# Patient Record
Sex: Male | Born: 1950 | Race: White | Hispanic: No | Marital: Married | Smoking: Former smoker
Health system: Southern US, Community
[De-identification: ages and names within clinical notes are randomized; demographics above are authoritative.]

## PROBLEM LIST (undated history)

## (undated) DIAGNOSIS — M1990 Unspecified osteoarthritis, unspecified site: Secondary | ICD-10-CM

## (undated) DIAGNOSIS — I2510 Atherosclerotic heart disease of native coronary artery without angina pectoris: Secondary | ICD-10-CM

## (undated) DIAGNOSIS — I499 Cardiac arrhythmia, unspecified: Secondary | ICD-10-CM

## (undated) DIAGNOSIS — I428 Other cardiomyopathies: Secondary | ICD-10-CM

## (undated) DIAGNOSIS — I4891 Unspecified atrial fibrillation: Secondary | ICD-10-CM

## (undated) DIAGNOSIS — Z720 Tobacco use: Secondary | ICD-10-CM

## (undated) DIAGNOSIS — K219 Gastro-esophageal reflux disease without esophagitis: Secondary | ICD-10-CM

## (undated) HISTORY — DX: Gastro-esophageal reflux disease without esophagitis: K21.9

## (undated) HISTORY — DX: Unspecified atrial fibrillation: I48.91

## (undated) HISTORY — DX: Atherosclerotic heart disease of native coronary artery without angina pectoris: I25.10

## (undated) HISTORY — DX: Other cardiomyopathies: I42.8

## (undated) HISTORY — DX: Tobacco use: Z72.0

## (undated) HISTORY — PX: APPENDECTOMY: SHX54

## (undated) HISTORY — PX: TONSILLECTOMY: SUR1361

---

## 2001-03-22 ENCOUNTER — Emergency Department (HOSPITAL_COMMUNITY): Admission: EM | Admit: 2001-03-22 | Discharge: 2001-03-23 | Payer: Self-pay | Admitting: *Deleted

## 2001-03-23 ENCOUNTER — Encounter: Payer: Self-pay | Admitting: *Deleted

## 2003-12-06 HISTORY — PX: COLONOSCOPY: SHX174

## 2004-01-26 ENCOUNTER — Ambulatory Visit (HOSPITAL_COMMUNITY): Admission: RE | Admit: 2004-01-26 | Discharge: 2004-01-26 | Payer: Self-pay | Admitting: General Surgery

## 2004-04-26 ENCOUNTER — Ambulatory Visit (HOSPITAL_COMMUNITY): Admission: RE | Admit: 2004-04-26 | Discharge: 2004-04-26 | Payer: Self-pay | Admitting: Orthopedic Surgery

## 2004-05-12 ENCOUNTER — Encounter (HOSPITAL_COMMUNITY): Admission: RE | Admit: 2004-05-12 | Discharge: 2004-06-11 | Payer: Self-pay | Admitting: Orthopedic Surgery

## 2005-09-08 ENCOUNTER — Ambulatory Visit (HOSPITAL_COMMUNITY): Admission: RE | Admit: 2005-09-08 | Discharge: 2005-09-08 | Payer: Self-pay | Admitting: Family Medicine

## 2007-10-29 ENCOUNTER — Ambulatory Visit (HOSPITAL_COMMUNITY): Admission: RE | Admit: 2007-10-29 | Discharge: 2007-10-29 | Payer: Self-pay | Admitting: Family Medicine

## 2010-07-27 ENCOUNTER — Emergency Department (HOSPITAL_COMMUNITY)
Admission: EM | Admit: 2010-07-27 | Discharge: 2010-07-27 | Payer: Self-pay | Source: Home / Self Care | Admitting: Emergency Medicine

## 2010-08-12 ENCOUNTER — Ambulatory Visit: Payer: Self-pay | Admitting: Cardiology

## 2010-08-12 DIAGNOSIS — I4891 Unspecified atrial fibrillation: Secondary | ICD-10-CM | POA: Insufficient documentation

## 2010-08-12 DIAGNOSIS — K219 Gastro-esophageal reflux disease without esophagitis: Secondary | ICD-10-CM | POA: Insufficient documentation

## 2010-08-13 ENCOUNTER — Encounter: Payer: Self-pay | Admitting: Cardiology

## 2010-08-16 ENCOUNTER — Encounter (INDEPENDENT_AMBULATORY_CARE_PROVIDER_SITE_OTHER): Payer: Self-pay | Admitting: *Deleted

## 2010-08-18 ENCOUNTER — Ambulatory Visit (HOSPITAL_COMMUNITY): Admission: RE | Admit: 2010-08-18 | Discharge: 2010-08-18 | Payer: Self-pay | Admitting: Cardiology

## 2010-08-18 ENCOUNTER — Ambulatory Visit: Payer: Self-pay | Admitting: Cardiovascular Disease

## 2010-08-18 ENCOUNTER — Encounter: Payer: Self-pay | Admitting: Cardiology

## 2010-09-09 ENCOUNTER — Ambulatory Visit: Payer: Self-pay | Admitting: Cardiology

## 2010-09-14 ENCOUNTER — Encounter: Payer: Self-pay | Admitting: Cardiology

## 2011-01-02 LAB — CONVERTED CEMR LAB
BUN: 16 mg/dL
CO2: 26 meq/L
Chloride: 106 meq/L
Cholesterol: 159 mg/dL
Cholesterol: 166 mg/dL (ref 0–200)
Creatinine, Ser: 1 mg/dL
Glucose, Bld: 116 mg/dL
HDL: 40 mg/dL
HDL: 40 mg/dL (ref >39)
LDL (calc): 109 mg/dL
LDL Cholesterol: 105 mg/dL — ABNORMAL HIGH (ref 0–99)
Potassium: 5.3 meq/L
Sodium: 138 meq/L
TSH: 2.084 microintl units/mL (ref 0.350–4.500)
Total CHOL/HDL Ratio: 4.2
Triglyceride fasting, serum: 50 mg/dL
Triglycerides: 104 mg/dL (ref <150)
VLDL: 21 mg/dL (ref 0–40)

## 2011-01-04 NOTE — Assessment & Plan Note (Signed)
Summary: 2 WK NURSE VISIT FOR BP CHECK AND RHYTHM STRIP/TG  Nurse Visit   Vital Signs:  Patient profile:   60 year old male Height:      73 inches Weight:      190 pounds O2 Sat:      97 % on Room air Temp:     97.5 degrees F oral Pulse rate:   83 / minute Pulse (ortho):   94 / minute BP standing:   133 / 88  Vitals Entered By: Teressa Lower RN (September 09, 2010 8:33 AM)  O2 Flow:  Room air  Serial Vital Signs/Assessments:  Time      Position  BP       Pulse  Resp  Temp     By 10:34 AM  Lying LA  131/87   78                    Tammy Sanders RN 10:34 AM  Sitting   128/90   83                    Teressa Lower RN 10:34 AM  Standing  133/88   94                    Tammy Sanders RN  Comments: 10:34 AM pt denies dizziness during ortho vitals By: Teressa Lower RN    Current Medications (verified): 1)  Tums 500 Mg Chew (Calcium Carbonate Antacid) .... Take Prn 2)  Aspir-Low 81 Mg Tbec (Aspirin) .... Take 1 Tab Daily 3)  Diltiazem Hcl 120 Mg Tabs (Diltiazem Hcl) .... Take 1 Tablet By Mouth Once Daily  Allergies (verified): No Known Drug Allergies  Primary Tam Savoia:  Dr.Scott Luking   History of Present Illness: S: 2 week nurse visit B: atrial fib, started on diltiazem 120mg  daily,echo and lipid profile A: denies complaints, rhythm strip , ortho vitals R: Rhythm Strip-atrial fibrillation with heart rate just below 100 bpm.  Controlled heart rate is adequate if not optimal. Increase diltiazem to 180 mg q.d. after current prescription exhausted.   Prescriptions: DILTIAZEM HCL ER BEADS 180 MG XR24H-CAP (DILTIAZEM HCL ER BEADS) Take one capsule by mouth daily  #30 x 6   Entered by:   Teressa Lower RN   Authorized by:   Kathlen Brunswick, MD, [facility]   Signed by:   Teressa Lower RN on 09/13/2010   Method used:   Electronically to        [facility] (retail)       [address]       [place], [place]  [address]       Ph: [phone]       Fax: [phone]   RxID:   [id number]  I spoke with pt's spouse, verbalized understanding   Teressa Lower RN  September 13, 2010 4:48 PM

## 2011-01-04 NOTE — Letter (Signed)
Summary: [place] [employer], agricultural at [facility]  [address] [place] [address]   Phone: [phone]  Fax: [phone]      August 16, 2010 MRN: [id number]   David Dougherty [address] [place], [place]  [address]   Dear Mr. Brooking,  Your test ordered by Selena Batten has been reviewed by your physician (or physician assistant) and was found to be normal or stable. Your physician (or physician assistant) felt no changes were needed at this time.  ____ Echocardiogram  ____ Cardiac Stress Test  _x___ Lab Work  ____ Peripheral vascular study of arms, legs or neck  ____ CT scan or X-ray  ____ Lung or Breathing test  ____ Other:  No change in medical treatment at this time, per Dr. Dietrich Pates.  Enclosed is a copy of your labwork for your records.  Thank you, Tammy Allyne Gee RN    [place] Bing, MD, [facility] Arena.C.Gaylord Shih, MD, F.A.C.C Lewayne Bunting, MD, F.A.C.C Nona Dell, MD, F.A.C.C Charlton Haws, MD, Lenise Arena.C.C

## 2011-01-04 NOTE — Assessment & Plan Note (Signed)
Summary: Initial OV /rm   Visit Type:  Initial Consult Primary Provider:  Dr.Scott Luking   History of Present Illness: [name] is seen in the office today for an initial visit at the kind request of Dr. Lilyan Punt for evaluation of a newly diagnosed atrial fibrillation.  David Dougherty has been incredibly healthy all of his life.  He has no chronic medical conditions and takes no medications routinely.  He has not been hospitalized for approximately the past 50 years after undergoing tonsillectomy and appendectomy as a child.  He has never been told of heart problems nor been evaluated by cardiologist.  He has never undergone any significant medical testing.  At a recent routine physical, EKG showed atrial fibrillation with a somewhat rapid response.  David Dougherty recalls having had an EKG some years ago, perhaps at [facility].  There is no history of thyroid disease or hypertension.      EKG  Procedure date:  08/12/2010  Findings:      Rhythm Strip  Atrial fibrillation with a heart rate of 104 BPM at rest.  After 6 minutes of walking, patient traversed to 1500 feet without difficulty.  Heart rate increased only to 115 bpm.  -  Date:  06/08/2010    Cholesterol: 159    LDL-calculated: 109    HDL: 40    Triglycerides: 50    BG Random: 116    BUN: 16    Creatinine: 1    Sodium: 138    Potassium: 5.3    Chloride: 106    CO2 Total: 26   Current Medications (verified): 1)  Tums 500 Mg Chew (Calcium Carbonate Antacid) .... Take Prn 2)  Aspir-Low 81 Mg Tbec (Aspirin) .... Take 1 Tab Daily 3)  Diltiazem Hcl 120 Mg Tabs (Diltiazem Hcl) .... Take 1 Tablet By Mouth Once Daily  Allergies (verified): No Known Drug Allergies  Past History:  Family History: Last updated: 08/12/2010 Father has a history of hypertension, diabetes and myocardial infarction and is deceased. Mother-alive and well Siblings-2 brothers died in infancy; one sister is alive and  well.  Social History: Last updated: 08/12/2010 Employment: Engineer, site Married; 5 children Tobacco Use - Yes. dips Alcohol Use - no Regular Exercise - no Drug Use - no  Past Medical History: Gastroesophageal reflux disease-mild; managed with antacids Tobacco abuse-snuff  Past Surgical History: Appendectomy Tonsillectomy Colonoscopy-negative in 2005  Family History: Father has a history of hypertension, diabetes and myocardial infarction and is deceased. Mother-alive and well Siblings-2 brothers died in infancy; one sister is alive and well.  Social History: Employment: Engineer, site Married; 5 children Tobacco Use - Yes. dips Alcohol Use - no Regular Exercise - no Drug Use - no  Review of Systems       requires corrective lenses; upper dentures; arthritic discomfort of the hands; intermittent mild pedal edema.  All other systems reviewed and are negative.  Vital Signs:  Patient profile:   60 year old male Height:      73 inches Weight:      190 pounds BMI:     25.16 Pulse rate:   55 / minute BP sitting:   133 / 90  (right arm)  Vitals Entered By: Dreama Saa, CNA (August 12, 2010 1:16 PM)  Physical Exam  General:  Proportionate weight and height; well-developed; no acute distress: HEENT-Justice/AT; PERRL; EOM intact; conjunctiva and lids nl:  Neck-No JVD; no carotid bruits: Endocrine-No thyromegaly: Lungs-No tachypnea,  clear without rales, rhonchi or wheezes: CV-normal PMI; normal S1 and S2:;  Abdomen-BS normal; soft and non-tender without masses or organomegaly: MS-No deformities, cyanosis or clubbing: Neurologic-Nl cranial nerves; symmetric strength and tone: Skin- Warm, no sig. lesions: Extremities-Nl distal pulses; no edema    Impression & Recommendations:  Problem # 1:  ATRIAL FIBRILLATION (ICD-427.31) Arrhythmia is of uncertain duration.  We will attempt to locate a previous EKG, but that will not allow [place]  to accurately date the onset of AF.  Patient is asymptomatic despite very mild tachycardia.  He could reasonably be treated with no rate control medication, but we will add diltiazem 120 mg q.d.  He will return in 2 weeks for rhythm strip and reassessment of vital signs by the cardiology nurses.  In the interim, an echocardiogram and TSH level will be obtained.  I suspect that he has no structural heart disease and thus has a very low risk of thromboembolism.  It does not appear that he will need warfarin.  He may continue daily aspirin.  The likelihood of achieving and maintaining sinus rhythm is low, especially in this gentleman who is does not like to take medication.  A strategy of rate control will be pursued.  I will plan to reassess this nice gentleman in 6 months.  Other Orders: T-TSH [id number]) T-Lipid Profile [id number]) 2-D Echocardiogram (2D Echo)  Patient Instructions: 1)  Your physician recommends that you schedule a follow-up appointment in: 2 weeks to check vital signs and rhythm strip and in 6 months  2)  Your physician recommends that you return for lab work in: Today 3)  Your physician has recommended you make the following change in your medication: Start taking Diltiazem 120mg  by mouth once daily  4)  Your physician has requested that you have an echocardiogram.  Echocardiography is a painless test that uses sound waves to create images of your heart. It provides your doctor with information about the size and shape of your heart and how well your heart's chambers and valves are working.  This procedure takes approximately one hour. There are no restrictions for this procedure. Prescriptions: DILTIAZEM HCL 120 MG TABS (DILTIAZEM HCL) take 1 tablet by mouth once daily  #30 x 6   Entered by:   Larita Fife Via LPN   Authorized by:   Kathlen Brunswick, MD, [facility]   Signed by:   Larita Fife Via LPN on 73/41/9379   Method used:   Electronically to        [facility] (retail)        [address]       [place], [place]  [address]       Ph: [phone]       Fax: [phone]   RxID:   [id number]   Prevention & Chronic Care Immunizations   Influenza vaccine: Not documented    Tetanus booster: Not documented    Pneumococcal vaccine: Not documented  Colorectal Screening   Hemoccult: Not documented    Colonoscopy: Not documented  Other Screening   PSA: Not documented   Smoking status: current  (08/10/2010)    Screening comments: patient dips  Lipids   Total Cholesterol: 159  (06/08/2010)   LDL: Not documented   LDL Direct: Not documented   HDL: 40  (06/08/2010)   Triglycerides: Not documented

## 2011-11-25 ENCOUNTER — Encounter: Payer: Self-pay | Admitting: Cardiology

## 2013-03-22 ENCOUNTER — Ambulatory Visit: Payer: Self-pay | Admitting: Family Medicine

## 2013-03-27 ENCOUNTER — Encounter: Payer: Self-pay | Admitting: Family Medicine

## 2013-03-27 ENCOUNTER — Ambulatory Visit (INDEPENDENT_AMBULATORY_CARE_PROVIDER_SITE_OTHER): Payer: Self-pay | Admitting: Family Medicine

## 2013-03-27 VITALS — BP 126/84 | Temp 98.4°F | Ht 71.0 in | Wt 203.5 lb

## 2013-03-27 DIAGNOSIS — J309 Allergic rhinitis, unspecified: Secondary | ICD-10-CM

## 2013-03-27 MED ORDER — AMOXICILLIN 500 MG PO TABS: 500.0000 mg | ORAL_TABLET | Freq: Two times a day (BID) | ORAL | Status: DC

## 2013-03-27 MED ORDER — METHYLPREDNISOLONE ACETATE 40 MG/ML IJ SUSP
40.0000 mg | Freq: Once | INTRAMUSCULAR | Status: AC
  Administered 2013-03-27: 40 mg via INTRAMUSCULAR

## 2013-03-27 NOTE — Patient Instructions (Signed)
Loratadine 10 mg one daily  Physical and labs

## 2013-03-27 NOTE — Progress Notes (Signed)
  Subjective:    Patient ID: David Dougherty, male    DOB: June 18, 1951, 62 y.o.   MRN: [id number]  HPIpatient with head congestion drainage coughing some chest congestion denies wheezing difficulty breathing nausea vomiting diarrhea relates some sinus pressure some ear pressure do no sweats or chills vomiting or diarrhea. Did bring up some mucoid drainage but states now it's doing better.    Review of Systemssinus nontender eardrums normal neck supple lungs clear     Objective:   Physical Exam  Lungs are clear hearts regular neck supple eardrums normal nontender sinus      Assessment & Plan:  Probable URI with allergic rhinitis-Depo-Medrol shot, loratadine daily. I also recommend amoxicillin 500 mg 3 times a day 10 days. Followup if ongoing troubles. Regular physical recommended.

## 2014-07-30 ENCOUNTER — Emergency Department (HOSPITAL_COMMUNITY)
Admission: EM | Admit: 2014-07-30 | Discharge: 2014-07-30 | Disposition: A | Discharge status: Home / Self Care | Payer: BC Managed Care – PPO | Attending: Emergency Medicine | Admitting: Emergency Medicine

## 2014-07-30 ENCOUNTER — Encounter (HOSPITAL_COMMUNITY): Payer: Self-pay | Admitting: Emergency Medicine

## 2014-07-30 DIAGNOSIS — Y9389 Activity, other specified: Secondary | ICD-10-CM | POA: Insufficient documentation

## 2014-07-30 DIAGNOSIS — Y929 Unspecified place or not applicable: Secondary | ICD-10-CM | POA: Insufficient documentation

## 2014-07-30 DIAGNOSIS — S91009A Unspecified open wound, unspecified ankle, initial encounter: Principal | ICD-10-CM

## 2014-07-30 DIAGNOSIS — Z8719 Personal history of other diseases of the digestive system: Secondary | ICD-10-CM | POA: Diagnosis not present

## 2014-07-30 DIAGNOSIS — S81809A Unspecified open wound, unspecified lower leg, initial encounter: Principal | ICD-10-CM

## 2014-07-30 DIAGNOSIS — S81009A Unspecified open wound, unspecified knee, initial encounter: Secondary | ICD-10-CM | POA: Diagnosis present

## 2014-07-30 DIAGNOSIS — Z87891 Personal history of nicotine dependence: Secondary | ICD-10-CM | POA: Diagnosis not present

## 2014-07-30 DIAGNOSIS — Z8679 Personal history of other diseases of the circulatory system: Secondary | ICD-10-CM | POA: Diagnosis not present

## 2014-07-30 DIAGNOSIS — W28XXXA Contact with powered lawn mower, initial encounter: Secondary | ICD-10-CM | POA: Diagnosis not present

## 2014-07-30 DIAGNOSIS — S81811A Laceration without foreign body, right lower leg, initial encounter: Secondary | ICD-10-CM

## 2014-07-30 MED ORDER — LIDOCAINE HCL (PF) 1 % IJ SOLN
5.0000 mL | Freq: Once | INTRAMUSCULAR | Status: AC
  Administered 2014-07-30: 5 mL via INTRADERMAL
  Filled 2014-07-30: qty 5

## 2014-07-30 MED ORDER — POVIDONE-IODINE 10 % EX SOLN
CUTANEOUS | Status: AC
  Filled 2014-07-30: qty 118

## 2014-07-30 MED ORDER — BACITRACIN 500 UNIT/GM EX OINT
1.0000 "application " | TOPICAL_OINTMENT | Freq: Two times a day (BID) | CUTANEOUS | Status: DC
  Administered 2014-07-30: 1 via TOPICAL
  Filled 2014-07-30 (×3): qty 0.9

## 2014-07-30 MED ORDER — BACITRACIN ZINC 500 UNIT/GM EX OINT
TOPICAL_OINTMENT | CUTANEOUS | Status: AC
  Filled 2014-07-30: qty 0.9

## 2014-07-30 NOTE — ED Notes (Signed)
PT has laceration to left lower leg. PT was unloading a lawn mower and cut his leg on the tongue of the trailer.

## 2014-07-30 NOTE — ED Provider Notes (Signed)
CSN: [id number]     Arrival date & time 07/30/14  1806 History   First MD Initiated Contact with Patient 07/30/14 1940     Chief Complaint  Patient presents with  . Extremity Laceration   David Dougherty is a 63 y.o. male who presents to the Emergency Department complaining of laceration to the right lower leg that occurred due to a direct blow to a metal edge of a trailer.  Pt denies numbness or weakness of the extremity.    (Consider location/radiation/quality/duration/timing/severity/associated sxs/prior Treatment) Patient is a 63 y.o. male presenting with skin laceration.  Laceration Location:  Leg Leg laceration location:  R lower leg Depth:  Through dermis Quality: straight   Bleeding: controlled   Time since incident: just prior to ED arrival. Laceration mechanism:  Metal edge Pain details:    Quality:  Aching   Severity:  Mild   Timing:  Constant   Progression:  Unchanged Foreign body present:  No foreign bodies Relieved by:  Nothing Worsened by:  Nothing tried Ineffective treatments:  None tried Tetanus status:  Up to date   Past Medical History  Diagnosis Date  . GERD (gastroesophageal reflux disease)   . Tobacco abuse     Snuff  . Atrial fibrillation    Past Surgical History  Procedure Laterality Date  . Appendectomy    . Tonsillectomy    . Colonoscopy  2005    Neg   Family History  Problem Relation Age of Onset  . Heart attack Father   . Hypertension Father   . Diabetes Father    History  Substance Use Topics  . Smoking status: Former Research scientist (life sciences)  . Smokeless tobacco: Current User    Types: Snuff  . Alcohol Use: No    Review of Systems  Constitutional: Negative for fever and chills.  Musculoskeletal: Negative for arthralgias, back pain and joint swelling.  Skin: Positive for wound.       Laceration   Neurological: Negative for dizziness, weakness and numbness.  Hematological: Does not bruise/bleed easily.  All other systems reviewed and are  negative.     Allergies  Review of patient's allergies indicates no known allergies.  Home Medications   Prior to Admission medications   Not on File   BP 152/95  Pulse 98  Temp(Src) 98.3 F (36.8 C) (Oral)  Resp 20  Ht 6\' 1"  (1.854 m)  Wt 185 lb (83.915 kg)  BMI 24.41 kg/m2  SpO2 99% Physical Exam  Nursing note and vitals reviewed. Constitutional: He is oriented to person, place, and time. He appears well-developed and well-nourished. No distress.  HENT:  Head: Normocephalic and atraumatic.  Cardiovascular: Normal rate, regular rhythm, normal heart sounds and intact distal pulses.   No murmur heard. Pulmonary/Chest: Effort normal and breath sounds normal. No respiratory distress.  Musculoskeletal: Normal range of motion. He exhibits no edema and no tenderness.  Pt has full ROM of the right knee, foot and ankle.  Distal sensation intact.    Neurological: He is alert and oriented to person, place, and time. He exhibits normal muscle tone. Coordination normal.  Skin: Skin is warm. Laceration noted.  Laceration to the anterior right lower leg.  Bleeding controlled.  No FB's or hematoma.    ED Course  Procedures (including critical care time) Labs Review Labs Reviewed - No data to display  Imaging Review No results found.   EKG Interpretation None      MDM   Final diagnoses:  Laceration of  lower leg without complication, right, initial encounter    LACERATION REPAIR Performed by: Claris Guymon L. Authorized by: Hale Bogus Consent: Verbal consent obtained. Risks and benefits: risks, benefits and alternatives were discussed Consent given by: patient Patient identity confirmed: provided demographic data Prepped and Draped in normal sterile fashion Wound explored  Laceration Location: right lower leg Laceration Length: 5 cm  No Foreign Bodies seen or palpated  Anesthesia: local infiltration  Local anesthetic: lidocaine 1% w/o  epinephrine  Anesthetic total: 3 ml  Irrigation method: syringe Amount of cleaning: standard  Skin closure: staples  Number of staples: 7  Technique: stapling  Patient tolerance: Patient tolerated the procedure well with no immediate complications.   Pt ambulates with steady gait.  No injury to the deep structures on exam.  He agrees to proper wound care, staples out in 7-10 days and to return here for any signs of infection. OTC  Tylenol or ibuprofen if needed for pain.    David Cattell L. Nikai Quest, PA-C 08/01/14 1316

## 2014-07-30 NOTE — Discharge Instructions (Signed)
Laceration Care, Adult °A laceration is a cut that goes through all layers of the skin. The cut goes into the tissue beneath the skin. °HOME CARE °For stitches (sutures) or staples: °· Keep the cut clean and dry. °· If you have a bandage (dressing), change it at least once a day. Change the bandage if it gets wet or dirty, or as told by your doctor. °· Wash the cut with soap and water 2 times a day. Rinse the cut with water. Pat it dry with a clean towel. °· Put a thin layer of medicated cream on the cut as told by your doctor. °· You may shower after the first 24 hours. Do not soak the cut in water until the stitches are removed. °· Only take medicines as told by your doctor. °· Have your stitches or staples removed as told by your doctor. °For skin adhesive strips: °· Keep the cut clean and dry. °· Do not get the strips wet. You may take a bath, but be careful to keep the cut dry. °· If the cut gets wet, pat it dry with a clean towel. °· The strips will fall off on their own. Do not remove the strips that are still stuck to the cut. °For wound glue: °· You may shower or take baths. Do not soak or scrub the cut. Do not swim. Avoid heavy sweating until the glue falls off on its own. After a shower or bath, pat the cut dry with a clean towel. °· Do not put medicine on your cut until the glue falls off. °· If you have a bandage, do not put tape over the glue. °· Avoid lots of sunlight or tanning lamps until the glue falls off. Put sunscreen on the cut for the first year to reduce your scar. °· The glue will fall off on its own. Do not pick at the glue. °You may need a tetanus shot if: °· You cannot remember when you had your last tetanus shot. °· You have never had a tetanus shot. °If you need a tetanus shot and you choose not to have one, you may get tetanus. Sickness from tetanus can be serious. °GET HELP RIGHT AWAY IF:  °· Your pain does not get better with medicine. °· Your arm, hand, leg, or foot loses feeling  (numbness) or changes color. °· Your cut is bleeding. °· Your joint feels weak, or you cannot use your joint. °· You have painful lumps on your body. °· Your cut is red, puffy (swollen), or painful. °· You have a red line on the skin near the cut. °· You have yellowish-white fluid (pus) coming from the cut. °· You have a fever. °· You have a bad smell coming from the cut or bandage. °· Your cut breaks open before or after stitches are removed. °· You notice something coming out of the cut, such as wood or glass. °· You cannot move a finger or toe. °MAKE SURE YOU:  °· Understand these instructions. °· Will watch your condition. °· Will get help right away if you are not doing well or get worse. °Document Released: 05/09/2008 Document Revised: 02/13/2012 Document Reviewed: 05/17/2011 °ExitCare® Patient Information ©2015 [employer]. This information is not intended to replace advice given to you by your health care provider. Make sure you discuss any questions you have with your health care provider. ° °Stitches, Staples, or Skin Adhesive Strips  °Stitches (sutures), staples, and skin adhesive strips hold the skin together   as it heals. They will usually be in place for 7 days or less. °HOME CARE °· Wash your hands with soap and water before and after you touch your wound. °· Only take medicine as told by your doctor. °· Cover your wound only if your doctor told you to. Otherwise, leave it open to air. °· Do not get your stitches wet or dirty. If they get dirty, dab them gently with a clean washcloth. Wet the washcloth with soapy water. Do not rub. Pat them dry gently. °· Do not put medicine or medicated cream on your stitches unless your doctor told you to. °· Do not take out your own stitches or staples. Skin adhesive strips will fall off by themselves. °· Do not pick at the wound. Picking can cause an infection. °· Do not miss your follow-up appointment. °· If you have problems or questions, call your doctor. °GET  HELP RIGHT AWAY IF:  °· You have a temperature by mouth above 102° F (38.9° C), not controlled by medicine. °· You have chills. °· You have redness or pain around your stitches. °· There is puffiness (swelling) around your stitches. °· You notice fluid (drainage) from your stitches. °· There is a bad smell coming from your wound. °MAKE SURE YOU: °· Understand these instructions. °· Will watch your condition. °· Will get help if you are not doing well or get worse. °Document Released: 09/18/2009 Document Revised: 02/13/2012 Document Reviewed: 09/18/2009 °ExitCare® Patient Information ©2015 [employer]. This information is not intended to replace advice given to you by your health care provider. Make sure you discuss any questions you have with your health care provider. ° °

## 2014-08-07 NOTE — ED Provider Notes (Addendum)
Medical screening examination/treatment/procedure(s) were performed by non-physician practitioner and as supervising physician I was immediately available for consultation/collaboration.   EKG Interpretation None         EKG Interpretation None        Tanna Furry, MD 08/07/14 2113  Tanna Furry, MD 09/11/14 [phone]

## 2014-08-08 ENCOUNTER — Encounter: Payer: Self-pay | Admitting: Nurse Practitioner

## 2014-08-08 ENCOUNTER — Ambulatory Visit: Payer: Self-pay | Admitting: Family Medicine

## 2014-08-08 ENCOUNTER — Ambulatory Visit (INDEPENDENT_AMBULATORY_CARE_PROVIDER_SITE_OTHER): Payer: BC Managed Care – PPO | Admitting: Nurse Practitioner

## 2014-08-08 VITALS — BP 128/78 | Ht 71.0 in | Wt 198.0 lb

## 2014-08-08 DIAGNOSIS — Z4802 Encounter for removal of sutures: Secondary | ICD-10-CM

## 2014-08-10 ENCOUNTER — Encounter: Payer: Self-pay | Admitting: Nurse Practitioner

## 2014-08-10 NOTE — Progress Notes (Signed)
Subjective:  Presents with his wife for staple removal of a laceration on his right anterior knee. Has had staples for about 10 days. No fever or drainage.  Objective:   BP 128/78  Ht 5\' 11"  (1.803 m)  Wt 198 lb (89.812 kg)  BMI 27.63 kg/m2 Wound is healing well with good adhesion of the edges. No evidence of infection. At first to the staples are difficult to remove due to faulty equipment. Remainder of staples were removed without difficulty, minimal bleeding.  Assessment: Removal of wound staples  Plan: Reviewed proper skin care and warning signs of infection, call back if any problems.

## 2014-08-15 ENCOUNTER — Ambulatory Visit: Payer: Self-pay | Admitting: Family Medicine

## 2014-11-13 ENCOUNTER — Encounter: Payer: Self-pay | Admitting: Family Medicine

## 2014-11-13 ENCOUNTER — Ambulatory Visit (INDEPENDENT_AMBULATORY_CARE_PROVIDER_SITE_OTHER): Payer: BC Managed Care – PPO | Admitting: Family Medicine

## 2014-11-13 ENCOUNTER — Telehealth: Payer: Self-pay | Admitting: Family Medicine

## 2014-11-13 ENCOUNTER — Telehealth: Payer: Self-pay

## 2014-11-13 VITALS — BP 124/86 | Ht 71.0 in | Wt 200.0 lb

## 2014-11-13 DIAGNOSIS — R5383 Other fatigue: Secondary | ICD-10-CM

## 2014-11-13 DIAGNOSIS — I481 Persistent atrial fibrillation: Secondary | ICD-10-CM

## 2014-11-13 DIAGNOSIS — I4819 Other persistent atrial fibrillation: Secondary | ICD-10-CM

## 2014-11-13 DIAGNOSIS — Z0000 Encounter for general adult medical examination without abnormal findings: Secondary | ICD-10-CM

## 2014-11-13 DIAGNOSIS — Z125 Encounter for screening for malignant neoplasm of prostate: Secondary | ICD-10-CM

## 2014-11-13 DIAGNOSIS — I499 Cardiac arrhythmia, unspecified: Secondary | ICD-10-CM

## 2014-11-13 DIAGNOSIS — K219 Gastro-esophageal reflux disease without esophagitis: Secondary | ICD-10-CM

## 2014-11-13 LAB — BASIC METABOLIC PANEL
BUN: 11 mg/dL (ref 6–23)
CO2: 27 mEq/L (ref 19–32)
Calcium: 9.6 mg/dL (ref 8.4–10.5)
Chloride: 102 mEq/L (ref 96–112)
Creat: 0.96 mg/dL (ref 0.50–1.35)
Glucose, Bld: 102 mg/dL — ABNORMAL HIGH (ref 70–99)
Potassium: 4.4 mEq/L (ref 3.5–5.3)
Sodium: 139 mEq/L (ref 135–145)

## 2014-11-13 LAB — LIPID PANEL
Cholesterol: 179 mg/dL (ref 0–200)
HDL: 41 mg/dL (ref >39)
LDL Cholesterol: 124 mg/dL — ABNORMAL HIGH (ref 0–99)
Total CHOL/HDL Ratio: 4.4 Ratio
Triglycerides: 72 mg/dL (ref <150)
VLDL: 14 mg/dL (ref 0–40)

## 2014-11-13 LAB — HEPATIC FUNCTION PANEL
ALT: 18 U/L (ref 0–53)
AST: 18 U/L (ref 0–37)
Albumin: 4.3 g/dL (ref 3.5–5.2)
Alkaline Phosphatase: 67 U/L (ref 39–117)
Bilirubin, Direct: 0.2 mg/dL (ref 0.0–0.3)
Indirect Bilirubin: 0.6 mg/dL (ref 0.2–1.2)
Total Bilirubin: 0.8 mg/dL (ref 0.2–1.2)
Total Protein: 7.1 g/dL (ref 6.0–8.3)

## 2014-11-13 LAB — TSH: TSH: 2.594 u[IU]/mL (ref 0.350–4.500)

## 2014-11-13 NOTE — Progress Notes (Addendum)
Subjective:    Patient ID: David Dougherty, male    DOB: 1951/03/15, 63 y.o.   MRN: [id number]  HPI The patient comes in today for a wellness visit.    A review of their health history was completed.  A review of medications was also completed.  Any needed refills; N/A  Eating habits: good  Falls/  MVA accidents in past few months: none  Regular exercise: yes  Specialist pt sees on regular basis: none  Preventative health issues were discussed.   Additional concerns: none  Patient has a history of atrial fibrillation. He relates that he had a problem with this several years ago he doesn't think he is having a problem now he doesn't denies having heart issues he denies chest tightness pressure pain or shortness of breath or swelling in the legs  He does relate intermittent reflux issues I instructed that he has frequent enough to where he should consider taken omeprazole when he sees gastroenterology name might recommend EGD. Review of Systems  Constitutional: Negative for fever, activity change and appetite change.  HENT: Negative for congestion and rhinorrhea.   Eyes: Negative for discharge.  Respiratory: Negative for cough and wheezing.   Cardiovascular: Negative for chest pain.  Gastrointestinal: Negative for vomiting, abdominal pain and blood in stool.  Endocrine: Positive for polyuria.  Genitourinary: Negative for frequency and difficulty urinating.  Musculoskeletal: Negative for neck pain.  Skin: Negative for rash.  Allergic/Immunologic: Negative for environmental allergies and food allergies.  Neurological: Negative for weakness and headaches.  Psychiatric/Behavioral: Negative for agitation.       Objective:   Physical Exam  Constitutional: He appears well-developed and well-nourished.  HENT:  Head: Normocephalic and atraumatic.  Right Ear: External ear normal.  Left Ear: External ear normal.  Nose: Nose normal.  Mouth/Throat: Oropharynx is clear and  moist.  Eyes: EOM are normal. Pupils are equal, round, and reactive to light.  Neck: Normal range of motion. Neck supple. No thyromegaly present.  Cardiovascular: Normal rate and normal heart sounds.   No murmur heard. Irregular, rate controlled  Pulmonary/Chest: Effort normal and breath sounds normal. No respiratory distress. He has no wheezes.  Abdominal: Soft. Bowel sounds are normal. He exhibits no distension and no mass. There is no tenderness.  Genitourinary: Penis normal.  Musculoskeletal: Normal range of motion. He exhibits no edema.  Lymphadenopathy:    He has no cervical adenopathy.  Neurological: He is alert. He exhibits normal muscle tone.  Skin: Skin is warm and dry. No erythema.  Psychiatric: He has a normal mood and affect. His behavior is normal. Judgment normal.    EKG- a fib rate controlled      Assessment & Plan:  Has reflux 2 times a week-prilosec qod see GI Needs colonoscopy, his last one was over 10 years ago-family requests Dr. Oneida Alar Do labs as recommended A.fib- start ASA 81 mg, check ECHO, cardio referral (most likely permanent) [place] score very low doubt other anticoagulant  Wellness/safety measures/dietary measures all discussed. Patient defers on flu shot. He will get pneumonia vaccine when he turns 57. He also relates he will call Dr. Oneida Alar office to set up colonoscopy. We will send a copy of this to Dr. Oneida Alar.  Addendum-echo was ordered because patient has atrial fibrillation and has dyspnea/shortness of breath with activity upon review his last echo was 2011 and it showed a depressed ejection fraction of 35-40%. We need to know if his clinical condition with his heart is getting worse.  He may need to have a implanted defibrillator. This echo is certainly medically indicated. Insurance company initially rejected the request for the echo.

## 2014-11-13 NOTE — Telephone Encounter (Signed)
Patient's wife Adela Lank) called to say that Dr Wolfgang Phoenix told patient to call us to set up his colonoscopy. He is wanting this done before the end of the month. I told wife I didn't know if that was possible, but I would have the triage nurse call them back. [phone]

## 2014-11-13 NOTE — Telephone Encounter (Signed)
Pt's [employer] states "criteria not met" for approval of the ECHO, please see information in red folder, please advise (I can edit request before formally submitting it)

## 2014-11-14 LAB — PSA: PSA: 4.6 ng/mL — ABNORMAL HIGH (ref <=4.00)

## 2014-11-14 NOTE — Progress Notes (Signed)
Results discussed with wife. Transferred up front to schedule f/u with Dr. Nicki Reaper next week.

## 2014-11-17 NOTE — Telephone Encounter (Signed)
This was completed with further information in the addendum thank you, ready for re-submittal

## 2014-11-20 ENCOUNTER — Ambulatory Visit (HOSPITAL_COMMUNITY)
Admission: RE | Admit: 2014-11-20 | Discharge: 2014-11-20 | Disposition: A | Discharge status: Home / Self Care | Payer: BC Managed Care – PPO | Source: Ambulatory Visit | Attending: Family Medicine | Admitting: Family Medicine

## 2014-11-20 ENCOUNTER — Ambulatory Visit (INDEPENDENT_AMBULATORY_CARE_PROVIDER_SITE_OTHER): Payer: BC Managed Care – PPO | Admitting: Family Medicine

## 2014-11-20 ENCOUNTER — Encounter: Payer: Self-pay | Admitting: Family Medicine

## 2014-11-20 VITALS — BP 140/90 | Ht 71.0 in | Wt 202.0 lb

## 2014-11-20 DIAGNOSIS — I340 Nonrheumatic mitral (valve) insufficiency: Secondary | ICD-10-CM | POA: Insufficient documentation

## 2014-11-20 DIAGNOSIS — I4891 Unspecified atrial fibrillation: Secondary | ICD-10-CM | POA: Diagnosis present

## 2014-11-20 DIAGNOSIS — I071 Rheumatic tricuspid insufficiency: Secondary | ICD-10-CM | POA: Diagnosis not present

## 2014-11-20 DIAGNOSIS — R7301 Impaired fasting glucose: Secondary | ICD-10-CM | POA: Insufficient documentation

## 2014-11-20 DIAGNOSIS — E785 Hyperlipidemia, unspecified: Secondary | ICD-10-CM | POA: Diagnosis not present

## 2014-11-20 DIAGNOSIS — R972 Elevated prostate specific antigen [PSA]: Secondary | ICD-10-CM

## 2014-11-20 DIAGNOSIS — I499 Cardiac arrhythmia, unspecified: Secondary | ICD-10-CM

## 2014-11-20 DIAGNOSIS — I059 Rheumatic mitral valve disease, unspecified: Secondary | ICD-10-CM

## 2014-11-20 DIAGNOSIS — R739 Hyperglycemia, unspecified: Secondary | ICD-10-CM

## 2014-11-20 LAB — POCT GLYCOSYLATED HEMOGLOBIN (HGB A1C): Hemoglobin A1C: 5.6

## 2014-11-20 NOTE — Progress Notes (Signed)
   Subjective:    Patient ID: David Dougherty, male    DOB: March 18, 1951, 63 y.o.   MRN: [id number]  HPI Patient is here today to go over BW results.  I am also checking a HgbA1C according to his BW results.  No concerns.     Review of Systems     Objective:   Physical Exam  The full visit was spent counseling the patient regarding the numerous issues please see below      Assessment & Plan:  #1 hyperlipidemia-I talked with patient at length it is very important for his LDL to be below 100. I encouraged him to exercise on a regular basis try to watch his diet. I recommended consideration of medication. Patient would like to try dietary measures first and he states he will recheck the lipid profile in approximately 3 months. Then if not at goal we will add medicine.  #2 hyperglycemia-his A1c overall looks good. He needs to be careful regarding starches in his diet regular physical activity minimize candies etc.  #3 elevated PSA-I talked with patient at length regarding this. We will set him up with consultation with [facility]. We will also check a total PSA/free PSA level. This is possible that this could all be a benign process but there is also a possibility that it could be a low-grade cancer multiple scenarios were discussed  #4-atrial fibrillation this is more than likely chronic atrial fibrillation echo is being done today sees cardiology tomorrow has low [place] score. Await cardiology's recommendation 25 minutes spent with patient covering all of these issues

## 2014-11-20 NOTE — Progress Notes (Signed)
  Echocardiogram 2D Echocardiogram has been performed.  Barren, Collins 11/20/2014, 2:55 PM

## 2014-11-21 ENCOUNTER — Ambulatory Visit (HOSPITAL_COMMUNITY)
Admission: RE | Admit: 2014-11-21 | Discharge: 2014-11-21 | Disposition: A | Discharge status: Home / Self Care | Payer: BC Managed Care – PPO | Source: Ambulatory Visit | Attending: Cardiology | Admitting: Cardiology

## 2014-11-21 ENCOUNTER — Encounter: Payer: Self-pay | Admitting: Cardiology

## 2014-11-21 ENCOUNTER — Ambulatory Visit: Payer: BC Managed Care – PPO | Admitting: Family Medicine

## 2014-11-21 ENCOUNTER — Other Ambulatory Visit: Payer: Self-pay | Admitting: Cardiology

## 2014-11-21 ENCOUNTER — Ambulatory Visit (INDEPENDENT_AMBULATORY_CARE_PROVIDER_SITE_OTHER): Payer: BC Managed Care – PPO | Admitting: Cardiology

## 2014-11-21 ENCOUNTER — Encounter: Payer: Self-pay | Admitting: *Deleted

## 2014-11-21 VITALS — BP 130/84 | HR 85 | Ht 73.0 in | Wt 202.0 lb

## 2014-11-21 DIAGNOSIS — I429 Cardiomyopathy, unspecified: Secondary | ICD-10-CM | POA: Insufficient documentation

## 2014-11-21 DIAGNOSIS — I517 Cardiomegaly: Secondary | ICD-10-CM | POA: Insufficient documentation

## 2014-11-21 DIAGNOSIS — I4891 Unspecified atrial fibrillation: Secondary | ICD-10-CM

## 2014-11-21 DIAGNOSIS — Z01810 Encounter for preprocedural cardiovascular examination: Secondary | ICD-10-CM | POA: Diagnosis present

## 2014-11-21 DIAGNOSIS — M40294 Other kyphosis, thoracic region: Secondary | ICD-10-CM | POA: Diagnosis not present

## 2014-11-21 DIAGNOSIS — Z01818 Encounter for other preprocedural examination: Secondary | ICD-10-CM

## 2014-11-21 DIAGNOSIS — M5134 Other intervertebral disc degeneration, thoracic region: Secondary | ICD-10-CM | POA: Insufficient documentation

## 2014-11-21 DIAGNOSIS — E785 Hyperlipidemia, unspecified: Secondary | ICD-10-CM

## 2014-11-21 LAB — PSA, TOTAL AND FREE
PSA, Free Pct: 26 % (ref >25)
PSA, Free: 1.03 ng/mL
PSA: 4 ng/mL (ref <=4.00)

## 2014-11-21 MED ORDER — CARVEDILOL 3.125 MG PO TABS: 3.1250 mg | ORAL_TABLET | Freq: Two times a day (BID) | ORAL | Status: DC

## 2014-11-21 NOTE — Patient Instructions (Addendum)
Your physician recommends that you schedule a follow-up appointment in: David Dougherty has requested that you have a cardiac catheterization. Cardiac catheterization is used to diagnose and/or treat various heart conditions. Doctors may recommend this procedure for a number of different reasons. The most common reason is to evaluate chest pain. Chest pain can be a symptom of coronary artery disease (CAD), and cardiac catheterization can show whether plaque is narrowing or blocking your heart's arteries. This procedure is also used to evaluate the valves, as well as measure the blood flow and oxygen levels in different parts of your heart. For further information please visit [email or web address]. Please follow instruction sheet, as given.   START Coreg 3.125 mg twice a day   Thank you for choosing [facility]!

## 2014-11-21 NOTE — Assessment & Plan Note (Signed)
Duration uncertain, recently documented by echocardiogram. LVEF 20% with diffuse hypokinesis suggests nonischemic cardiomyopathy. I am suspicious of a tachycardia-mediated cardiomyopathy in the setting of atrial fibrillation, although this is not certain. No long-standing history of hypertension, LDL 124, history of heart disease in his father. He is symptomatically fairly stable at this time with mild shortness of breath typically with activities. No chest pain symptoms. I had a thorough discussion with the patient, his wife, and his daughter present today. My recommendation is to continue aspirin, start Coreg 3.125 mg twice daily for now, anticipating additional medications eventually. Left and right heart catheterization will be scheduled (patient prefers week after Christmas), for definition of the coronary anatomy to determine if any revascularization options need to be considered, and also hemodynamic assessment to better understand which medications may be of most benefit. He will have lab work on the morning of his procedure, scheduled with Dr. Burt Knack. Potential risks and anticipated benefits were discussed and patient is in agreement to proceed.

## 2014-11-21 NOTE — Assessment & Plan Note (Signed)
Recent LDL 124. At this time he is not on any specific medical therapy with efforts at diet modification.

## 2014-11-21 NOTE — Progress Notes (Signed)
Reason for visit: Atrial fibrillation, cardiomyopathy  Clinical Summary David Dougherty is a 63 y.o.male referred for cardiology consultation by Dr. Wolfgang Phoenix. Record review finds prior evaluation by Dr. Lattie Haw back in 2011 for atrial fibrillation. He is here with his wife and daughter today. He saw Dr.Luking for a routine follow-up recently without specific cardiac complaints. He reports NYHA class 1-2 dyspnea with most activities, including climbing stairs, carrying objects, even climbing on his roof. He does not endorse any recurrent chest pain symptoms or any sense of palpitations. The absolute duration of his atrial fibrillation is uncertain, but it is most likely chronic. He does not report any history of CAD or myocardial infarction. Periodically he feels somewhat dizzy when he stands up from being crouched, otherwise had a episode of intense nausea and weakness as well as diaphoresis several weeks ago without obvious etiology. He has had no frank syncope.  Recent ECG showed rate-controlled atrial fibrillation with borderline decreased voltage in the limb leads, nonspecific ST-T changes, decreased R wave progression. Recent lab work showed cholesterol 179, triglycerides 72, HDL 41, and LDL 124. LFTs normal. TSH normal.  Echocardiogram was ordered by Dr. Wolfgang Phoenix and done yesterday reporting moderate LVH with LVEF approximately 20%, diffuse hypokinesis, evidence of elevated filling pressures, mild aortic root dilatation and ascending aortic dilatation, mild mitral regurgitation, moderate left atrial enlargement and right atrial enlargement, moderately reduced RV contraction, mild tricuspid regurgitation, and PASP 32 mmHg. Patient and his family were not aware of these test results, and we discussed them in detail today. Etiology and duration of his cardiomyopathy is also not certain. Fortunately, he has been without progressive symptoms other than mentioning a long-standing problem with dependent leg edema  that tends to be better after he gets up in the morning.  CHADSVASC score is 1, annual risk of thromboembolic stroke with aspirin would be 1.2%. ECG today shows atrial fibrillation at 89 bpm with nonspecific T-wave changes.  No Known Allergies  Current Outpatient Prescriptions  Medication Sig Dispense Refill  . acetaminophen (TYLENOL) 500 MG tablet Take 500 mg by mouth every 6 (six) hours as needed.    Marland Kitchen aspirin 81 MG tablet Take 81 mg by mouth daily.    . calcium carbonate (TUMS - DOSED IN MG ELEMENTAL CALCIUM) 500 MG chewable tablet Chew 1 tablet by mouth as needed for indigestion or heartburn.    . carvedilol (COREG) 3.125 MG tablet Take 1 tablet (3.125 mg total) by mouth 2 (two) times daily. 180 tablet 3   No current facility-administered medications for this visit.    Past Medical History  Diagnosis Date  . GERD (gastroesophageal reflux disease)   . Tobacco abuse     Snuff  . Atrial fibrillation     Past Surgical History  Procedure Laterality Date  . Appendectomy    . Tonsillectomy    . Colonoscopy  2005    Neg    Family History  Problem Relation Age of Onset  . Heart attack Father   . Hypertension Father   . Diabetes Father     Social History Mr. Krupka reports that he quit smoking about 42 years ago. His smoking use included Cigars and Cigarettes. He started smoking about 45 years ago. He has a .75 pack-year smoking history. His smokeless tobacco use includes Snuff. Mr. Decola reports that he does not drink alcohol.  Review of Systems Complete review of systems negative except as otherwise outlined in the clinical summary and also the following. Stable appetite, no  abdominal pain, no orthopnea or PND.  Physical Examination Filed Vitals:   11/21/14 1342  BP: 130/84  Pulse: 85   Filed Weights   11/21/14 1342  Weight: 202 lb (91.627 kg)   Patient appears comfortable at rest. HEENT: Conjunctiva and lids normal, oropharynx clear with moist mucosa. Neck:  Supple, no elevated JVP or carotid bruits, no thyromegaly. Lungs: Clear to auscultation, nonlabored breathing at rest. Cardiac: Indistinct PMI, irregularly irregular, no S3, soft systolic murmur, no pericardial rub. Abdomen: Soft, nontender, bowel sounds present, no guarding or rebound. Extremities: 1-2+ edema, distal pulses 2+. Skin: Warm and dry. Musculoskeletal: No kyphosis. Neuropsychiatric: Alert and oriented x3, affect grossly appropriate.   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 LVEF 20% with diffuse hypokinesis suggests nonischemic cardiomyopathy. I am suspicious of a tachycardia-mediated cardiomyopathy in the setting of atrial fibrillation, although this is not certain. No long-standing history of hypertension, LDL 124, history of heart disease in his father. He is symptomatically fairly stable at this time with mild shortness of breath typically with activities. No chest pain symptoms. I had a thorough discussion with the patient, his wife, and his daughter present today. My recommendation is to continue aspirin, start Coreg 3.125 mg twice daily for now, anticipating additional medications eventually. Left and right heart catheterization will be scheduled (patient prefers week after Christmas), for definition of the coronary anatomy to determine if any revascularization options need to be considered, and also hemodynamic assessment to better understand which medications may be of most benefit. He will have lab work on the morning of his procedure, scheduled with Dr. Burt Knack. Potential risks and anticipated benefits were discussed and patient is in agreement to proceed.  Atrial fibrillation Presumably chronic based on available information and not overly symptomatic. Resting heart rate is in the mid 80s. For now he will continue on aspirin with relatively low thromboembolic risk score as outlined above. Coreg is being added  in an effort to control heart rate and perhaps improve his associated cardiomyopathy.  Hyperlipidemia Recent LDL 124. At this time he is not on any specific medical therapy with efforts at diet modification.    Satira Sark, M.D., F.A.C.C.

## 2014-11-21 NOTE — Assessment & Plan Note (Signed)
Presumably chronic based on available information and not overly symptomatic. Resting heart rate is in the mid 80s. For now he will continue on aspirin with relatively low thromboembolic risk score as outlined above. Coreg is being added in an effort to control heart rate and perhaps improve his associated cardiomyopathy.

## 2014-11-24 NOTE — Telephone Encounter (Signed)
Agree 

## 2014-11-24 NOTE — Telephone Encounter (Signed)
I called and pt's wife said he is having some cardiac issues and will have to wait on the colonoscopy. She will call back when he is ready. I told her if he is having cardiac issues that he will need a clearance from Cardiology before we can schedule a colonoscopy. She said she will let us know.

## 2014-12-03 ENCOUNTER — Encounter (HOSPITAL_COMMUNITY): Payer: Self-pay | Admitting: Cardiovascular Disease

## 2014-12-03 ENCOUNTER — Ambulatory Visit (HOSPITAL_COMMUNITY)
Admission: RE | Admit: 2014-12-03 | Discharge: 2014-12-03 | Disposition: A | Discharge status: Home / Self Care | Payer: BC Managed Care – PPO | Source: Ambulatory Visit | Attending: Cardiovascular Disease | Admitting: Cardiovascular Disease

## 2014-12-03 ENCOUNTER — Encounter (HOSPITAL_COMMUNITY)
Admission: RE | Disposition: A | Discharge status: Home / Self Care | Payer: Self-pay | Source: Ambulatory Visit | Attending: Cardiovascular Disease

## 2014-12-03 DIAGNOSIS — I340 Nonrheumatic mitral (valve) insufficiency: Secondary | ICD-10-CM | POA: Insufficient documentation

## 2014-12-03 DIAGNOSIS — I071 Rheumatic tricuspid insufficiency: Secondary | ICD-10-CM | POA: Diagnosis not present

## 2014-12-03 DIAGNOSIS — K219 Gastro-esophageal reflux disease without esophagitis: Secondary | ICD-10-CM | POA: Diagnosis not present

## 2014-12-03 DIAGNOSIS — E785 Hyperlipidemia, unspecified: Secondary | ICD-10-CM | POA: Insufficient documentation

## 2014-12-03 DIAGNOSIS — I4891 Unspecified atrial fibrillation: Secondary | ICD-10-CM | POA: Insufficient documentation

## 2014-12-03 DIAGNOSIS — I429 Cardiomyopathy, unspecified: Secondary | ICD-10-CM

## 2014-12-03 DIAGNOSIS — I2510 Atherosclerotic heart disease of native coronary artery without angina pectoris: Secondary | ICD-10-CM | POA: Diagnosis not present

## 2014-12-03 HISTORY — PX: LEFT AND RIGHT HEART CATHETERIZATION WITH CORONARY ANGIOGRAM: SHX5449

## 2014-12-03 LAB — PROTIME-INR
INR: 1.1 (ref 0.00–1.49)
Prothrombin Time: 14.3 seconds (ref 11.6–15.2)

## 2014-12-03 LAB — POCT I-STAT 3, VENOUS BLOOD GAS (G3P V)
Acid-base deficit: 3 mmol/L — ABNORMAL HIGH (ref 0.0–2.0)
Bicarbonate: 24.2 mEq/L — ABNORMAL HIGH (ref 20.0–24.0)
O2 Saturation: 69 %
TCO2: 26 mmol/L (ref 0–100)
pCO2, Ven: 48.4 mmHg (ref 45.0–50.0)
pH, Ven: 7.307 — ABNORMAL HIGH (ref 7.250–7.300)
pO2, Ven: 40 mmHg (ref 30.0–45.0)

## 2014-12-03 LAB — POCT I-STAT 3, ART BLOOD GAS (G3+)
Acid-base deficit: 3 mmol/L — ABNORMAL HIGH (ref 0.0–2.0)
Bicarbonate: 23.4 mEq/L (ref 20.0–24.0)
O2 Saturation: 97 %
TCO2: 25 mmol/L (ref 0–100)
pCO2 arterial: 46.6 mmHg — ABNORMAL HIGH (ref 35.0–45.0)
pH, Arterial: 7.309 — ABNORMAL LOW (ref 7.350–7.450)
pO2, Arterial: 99 mmHg (ref 80.0–100.0)

## 2014-12-03 LAB — BASIC METABOLIC PANEL
Anion gap: 7 (ref 5–15)
BUN: 11 mg/dL (ref 6–23)
CO2: 24 mmol/L (ref 19–32)
Calcium: 8.9 mg/dL (ref 8.4–10.5)
Chloride: 107 mEq/L (ref 96–112)
Creatinine, Ser: 0.96 mg/dL (ref 0.50–1.35)
GFR calc Af Amer: >90 mL/min (ref >90)
GFR calc non Af Amer: 86 mL/min — ABNORMAL LOW (ref >90)
Glucose, Bld: 117 mg/dL — ABNORMAL HIGH (ref 70–99)
Potassium: 3.8 mmol/L (ref 3.5–5.1)
Sodium: 138 mmol/L (ref 135–145)

## 2014-12-03 LAB — CBC
HCT: 44.7 % (ref 39.0–52.0)
Hemoglobin: 14.6 g/dL (ref 13.0–17.0)
MCH: 28.3 pg (ref 26.0–34.0)
MCHC: 32.7 g/dL (ref 30.0–36.0)
MCV: 86.6 fL (ref 78.0–100.0)
Platelets: 181 10*3/uL (ref 150–400)
RBC: 5.16 MIL/uL (ref 4.22–5.81)
RDW: 13.1 % (ref 11.5–15.5)
WBC: 4.8 10*3/uL (ref 4.0–10.5)

## 2014-12-03 SURGERY — LEFT AND RIGHT HEART CATHETERIZATION WITH CORONARY ANGIOGRAM
Anesthesia: LOCAL

## 2014-12-03 MED ORDER — HEPARIN SODIUM (PORCINE) 1000 UNIT/ML IJ SOLN
INTRAMUSCULAR | Status: AC
  Filled 2014-12-03: qty 1

## 2014-12-03 MED ORDER — ACETAMINOPHEN 325 MG PO TABS: 650.0000 mg | ORAL_TABLET | ORAL | Status: DC | PRN

## 2014-12-03 MED ORDER — HEPARIN (PORCINE) IN NACL 2-0.9 UNIT/ML-% IJ SOLN
INTRAMUSCULAR | Status: AC
  Filled 2014-12-03: qty 1500

## 2014-12-03 MED ORDER — FENTANYL CITRATE 0.05 MG/ML IJ SOLN
INTRAMUSCULAR | Status: AC
  Filled 2014-12-03: qty 2

## 2014-12-03 MED ORDER — ATORVASTATIN CALCIUM 80 MG PO TABS: 80.0000 mg | ORAL_TABLET | Freq: Every day | ORAL | Status: DC

## 2014-12-03 MED ORDER — NITROGLYCERIN 1 MG/10 ML FOR IR/CATH LAB
INTRA_ARTERIAL | Status: AC
  Filled 2014-12-03: qty 10

## 2014-12-03 MED ORDER — VERAPAMIL HCL 2.5 MG/ML IV SOLN
INTRAVENOUS | Status: AC
  Filled 2014-12-03: qty 2

## 2014-12-03 MED ORDER — ASPIRIN 81 MG PO CHEW
CHEWABLE_TABLET | ORAL | Status: AC
  Filled 2014-12-03: qty 1

## 2014-12-03 MED ORDER — ASPIRIN 81 MG PO CHEW
81.0000 mg | CHEWABLE_TABLET | ORAL | Status: AC
  Administered 2014-12-03: 81 mg via ORAL

## 2014-12-03 MED ORDER — ONDANSETRON HCL 4 MG/2ML IJ SOLN: 4.0000 mg | Freq: Four times a day (QID) | INTRAMUSCULAR | Status: DC | PRN

## 2014-12-03 MED ORDER — SODIUM CHLORIDE 0.9 % IV SOLN: 1.0000 mL/kg/h | INTRAVENOUS | Status: DC

## 2014-12-03 MED ORDER — MIDAZOLAM HCL 2 MG/2ML IJ SOLN
INTRAMUSCULAR | Status: AC
  Filled 2014-12-03: qty 2

## 2014-12-03 MED ORDER — SODIUM CHLORIDE 0.9 % IJ SOLN: 3.0000 mL | Freq: Two times a day (BID) | INTRAMUSCULAR | Status: DC

## 2014-12-03 MED ORDER — SODIUM CHLORIDE 0.9 % IV SOLN: INTRAVENOUS | Status: DC

## 2014-12-03 MED ORDER — LIDOCAINE HCL (PF) 1 % IJ SOLN
INTRAMUSCULAR | Status: AC
  Filled 2014-12-03: qty 30

## 2014-12-03 MED ORDER — SODIUM CHLORIDE 0.9 % IJ SOLN: 3.0000 mL | INTRAMUSCULAR | Status: DC | PRN

## 2014-12-03 MED ORDER — SODIUM CHLORIDE 0.9 % IV SOLN: 250.0000 mL | INTRAVENOUS | Status: DC | PRN

## 2014-12-03 MED ORDER — ATORVASTATIN CALCIUM 80 MG PO TABS
80.0000 mg | ORAL_TABLET | ORAL | Status: AC
  Administered 2014-12-03: 80 mg via ORAL
  Filled 2014-12-03: qty 1

## 2014-12-03 NOTE — H&P (View-Only) (Signed)
Reason for visit: Atrial fibrillation, cardiomyopathy  Clinical Summary David Dougherty is a 63 y.o.male referred for cardiology consultation by Dr. Wolfgang Phoenix. Record review finds prior evaluation by Dr. Lattie Haw back in 2011 for atrial fibrillation. He is here with his wife and daughter today. He saw Dr.Luking for a routine follow-up recently without specific cardiac complaints. He reports NYHA class 1-2 dyspnea with most activities, including climbing stairs, carrying objects, even climbing on his roof. He does not endorse any recurrent chest pain symptoms or any sense of palpitations. The absolute duration of his atrial fibrillation is uncertain, but it is most likely chronic. He does not report any history of CAD or myocardial infarction. Periodically he feels somewhat dizzy when he stands up from being crouched, otherwise had a episode of intense nausea and weakness as well as diaphoresis several weeks ago without obvious etiology. He has had no frank syncope.  Recent ECG showed rate-controlled atrial fibrillation with borderline decreased voltage in the limb leads, nonspecific ST-T changes, decreased R wave progression. Recent lab work showed cholesterol 179, triglycerides 72, HDL 41, and LDL 124. LFTs normal. TSH normal.  Echocardiogram was ordered by Dr. Wolfgang Phoenix and done yesterday reporting moderate LVH with LVEF approximately 20%, diffuse hypokinesis, evidence of elevated filling pressures, mild aortic root dilatation and ascending aortic dilatation, mild mitral regurgitation, moderate left atrial enlargement and right atrial enlargement, moderately reduced RV contraction, mild tricuspid regurgitation, and PASP 32 mmHg. Patient and his family were not aware of these test results, and we discussed them in detail today. Etiology and duration of his cardiomyopathy is also not certain. Fortunately, he has been without progressive symptoms other than mentioning a long-standing problem with dependent leg edema  that tends to be better after he gets up in the morning.  CHADSVASC score is 1, annual risk of thromboembolic stroke with aspirin would be 1.2%. ECG today shows atrial fibrillation at 89 bpm with nonspecific T-wave changes.  No Known Allergies  Current Outpatient Prescriptions  Medication Sig Dispense Refill  . acetaminophen (TYLENOL) 500 MG tablet Take 500 mg by mouth every 6 (six) hours as needed.    Marland Kitchen aspirin 81 MG tablet Take 81 mg by mouth daily.    . calcium carbonate (TUMS - DOSED IN MG ELEMENTAL CALCIUM) 500 MG chewable tablet Chew 1 tablet by mouth as needed for indigestion or heartburn.    . carvedilol (COREG) 3.125 MG tablet Take 1 tablet (3.125 mg total) by mouth 2 (two) times daily. 180 tablet 3   No current facility-administered medications for this visit.    Past Medical History  Diagnosis Date  . GERD (gastroesophageal reflux disease)   . Tobacco abuse     Snuff  . Atrial fibrillation     Past Surgical History  Procedure Laterality Date  . Appendectomy    . Tonsillectomy    . Colonoscopy  2005    Neg    Family History  Problem Relation Age of Onset  . Heart attack Father   . Hypertension Father   . Diabetes Father     Social History Mr. Dibert reports that he quit smoking about 42 years ago. His smoking use included Cigars and Cigarettes. He started smoking about 45 years ago. He has a .75 pack-year smoking history. His smokeless tobacco use includes Snuff. Mr. Lofgren reports that he does not drink alcohol.  Review of Systems Complete review of systems negative except as otherwise outlined in the clinical summary and also the following. Stable appetite, no  abdominal pain, no orthopnea or PND.  Physical Examination Filed Vitals:   11/21/14 1342  BP: 130/84  Pulse: 85   Filed Weights   11/21/14 1342  Weight: 202 lb (91.627 kg)   Patient appears comfortable at rest. HEENT: Conjunctiva and lids normal, oropharynx clear with moist mucosa. Neck:  Supple, no elevated JVP or carotid bruits, no thyromegaly. Lungs: Clear to auscultation, nonlabored breathing at rest. Cardiac: Indistinct PMI, irregularly irregular, no S3, soft systolic murmur, no pericardial rub. Abdomen: Soft, nontender, bowel sounds present, no guarding or rebound. Extremities: 1-2+ edema, distal pulses 2+. Skin: Warm and dry. Musculoskeletal: No kyphosis. Neuropsychiatric: Alert and oriented x3, affect grossly appropriate.   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 LVEF 20% with diffuse hypokinesis suggests nonischemic cardiomyopathy. I am suspicious of a tachycardia-mediated cardiomyopathy in the setting of atrial fibrillation, although this is not certain. No long-standing history of hypertension, LDL 124, history of heart disease in his father. He is symptomatically fairly stable at this time with mild shortness of breath typically with activities. No chest pain symptoms. I had a thorough discussion with the patient, his wife, and his daughter present today. My recommendation is to continue aspirin, start Coreg 3.125 mg twice daily for now, anticipating additional medications eventually. Left and right heart catheterization will be scheduled (patient prefers week after Christmas), for definition of the coronary anatomy to determine if any revascularization options need to be considered, and also hemodynamic assessment to better understand which medications may be of most benefit. He will have lab work on the morning of his procedure, scheduled with Dr. Burt Knack. Potential risks and anticipated benefits were discussed and patient is in agreement to proceed.  Atrial fibrillation Presumably chronic based on available information and not overly symptomatic. Resting heart rate is in the mid 80s. For now he will continue on aspirin with relatively low thromboembolic risk score as outlined above. Coreg is being added  in an effort to control heart rate and perhaps improve his associated cardiomyopathy.  Hyperlipidemia Recent LDL 124. At this time he is not on any specific medical therapy with efforts at diet modification.    Satira Sark, M.D., F.A.C.C.

## 2014-12-03 NOTE — CV Procedure (Signed)
    Cardiac Catheterization Procedure Note  Name: David Dougherty MRN: [id number] DOB: Aug 12, 1951  Procedure: Right Heart Cath, Left Heart Cath, Selective Coronary Angiography, LV angiography  Indication: Cardiomyopathy  Procedural Details: There was an indwelling IV in a right antecubital vein. Using normal sterile technique, the IV was changed out for a 5 Fr brachial sheath over a 0.018 inch wire. The right wrist was then prepped, draped, and anesthetized with 1% lidocaine. Using the modified Seldinger technique a 5/6 French Slender sheath was placed in the right radial artery. Intra-arterial verapamil was administered through the radial artery sheath. IV heparin was administered after a JR4 catheter was advanced into the central aorta. A Swan-Ganz catheter was used for the right heart catheterization. Standard protocol was followed for recording of right heart pressures and sampling of oxygen saturations. Fick cardiac output was calculated. Standard Judkins catheters were used for selective coronary angiography and left ventriculography. There were no immediate procedural complications. The patient was transferred to the post catheterization recovery area for further monitoring.  Procedural Findings: Hemodynamics RA 7 RV 31/8 PA 27/12 mean 19 PCWP 15 LV 107/10 AO 101/61  Oxygen saturations: PA 69 AO 97  Cardiac Output (Fick) 5.1  Cardiac Index (Fick) 2.4   Coronary angiography: Coronary dominance: right  Left mainstem: The left main is widely. It is a long vessel with distal 20-30% stenosis before it trifurcates into the LAD, intermediate branch, and left circumflex.  Left anterior descending (LAD): The LAD is patent to the apex of the heart. The vessel has minor irregularities proximally. Wraps around the LV apex. There are no significant stenoses identified.  Left circumflex (LCx): There is a large intermediate branch that divides into twin vessels. There is no significant  obstruction throughout the intermediate territory. The AV circumflex gives off a medium caliber first OM but has a focal napkin ring 75% stenosis. The AV circumflex beyond the first OM is small. There is a 70% stenosis in the distal AV circumflex, but this vessel supplies no significant myocardium.  Right coronary artery (RCA): The RCA is a large, dominant vessel. It gives off a large acute marginal branch. The vessel is patent throughout its distribution. The mid RCA has 20-30% stenosis. The PDA and PLA branches are widely patent.  Left ventriculography: There is severe LV hypokinesis. The basal segments of the LV are moderately hypokinetic. The periapical segments are severely hypokinetic. The estimated LVEF is 25-30%.  Contrast: 90 cc Omnipaque  Radiation dose/Fluoro time: 5.4 minutes  Estimated Blood Loss: Minimal  Final Conclusions:   1. Severe nonischemic cardiomyopathy with LVEF less than 30% 2. Preserved intracardiac hemodynamics with preserved cardiac output 3. Single-vessel coronary artery disease with moderately severe stenosis of the first obtuse marginal branch of the left circumflex  Recommendations: Medical therapy for cardiomyopathy. I think the patient's coronary artery disease finding is likely incidental and unrelated to his cardiomyopathic process. This lesion would be amenable to PCI if he develops symptoms of angina or any other indication for revascularization.  Sherren Mocha MD, [facility] 12/03/2014, 9:14 AM

## 2014-12-03 NOTE — Progress Notes (Signed)
Site area: right brachial venous  Site Prior to Removal:  Level 0  Pressure Applied For 10 MINUTES    Minutes Beginning at 0930  Manual:   Yes.    Patient Status During Pull:  stable  Post Pull brachial venous Site:  Level 0  Post Pull Instructions Given:  Yes.    Post Pull Pulses Present:  Yes.    Dressing Applied:  Yes.     Bedrest Begins: n/a  Comments:  Pt tolerated venous sheath pull well

## 2014-12-03 NOTE — Interval H&P Note (Signed)
History and Physical Interval Note:  12/03/2014 8:21 AM  David Dougherty  has presented today for surgery, with the diagnosis of cardiomyopathy  The various methods of treatment have been discussed with the patient and family. After consideration of risks, benefits and other options for treatment, the patient has consented to  Procedure(s): LEFT AND RIGHT HEART CATHETERIZATION WITH CORONARY ANGIOGRAM (N/A) as a surgical intervention .  The patient's history has been reviewed, patient examined, no change in status, stable for surgery.  I have reviewed the patient's chart and labs.  Questions were answered to the patient's satisfaction.    Cath Lab Visit (complete for each Cath Lab visit)  Clinical Evaluation Leading to the Procedure:   ACS: No.  Non-ACS:    Anginal Classification: No Symptoms  Anti-ischemic medical therapy: Minimal Therapy (1 class of medications)  Non-Invasive Test Results: High-risk stress test findings: cardiac mortality >3%/year  Prior CABG: No previous CABG       Sherren Mocha

## 2014-12-03 NOTE — Discharge Instructions (Signed)
Radial Site Care °Refer to this sheet in the next few weeks. These instructions provide you with information on caring for yourself after your procedure. Your caregiver may also give you more specific instructions. Your treatment has been planned according to current medical practices, but problems sometimes occur. Call your caregiver if you have any problems or questions after your procedure. °HOME CARE INSTRUCTIONS °· You may shower the day after the procedure. Remove the bandage (dressing) and gently wash the site with plain soap and water. Gently pat the site dry. °· Do not apply powder or lotion to the site. °· Do not submerge the affected site in water for 3 to 5 days. °· Inspect the site at least twice daily. °· Do not flex or bend the affected arm for 24 hours. °· No lifting over 5 pounds (2.3 kg) for 5 days after your procedure. °· Do not drive home if you are discharged the same day of the procedure. Have someone else drive you. °· You may drive 24 hours after the procedure unless otherwise instructed by your caregiver. °· Do not operate machinery or power tools for 24 hours. °· A responsible adult should be with you for the first 24 hours after you arrive home. °What to expect: °· Any bruising will usually fade within 1 to 2 weeks. °· Blood that collects in the tissue (hematoma) may be painful to the touch. It should usually decrease in size and tenderness within 1 to 2 weeks. °SEEK IMMEDIATE MEDICAL CARE IF: °· You have unusual pain at the radial site. °· You have redness, warmth, swelling, or pain at the radial site. °· You have drainage (other than a small amount of blood on the dressing). °· You have chills. °· You have a fever or persistent symptoms for more than 72 hours. °· You have a fever and your symptoms suddenly get worse. °· Your arm becomes pale, cool, tingly, or numb. °· You have heavy bleeding from the site. Hold pressure on the site and call 911. °Document Released: 12/24/2010 Document  Revised: 02/13/2012 Document Reviewed: 12/24/2010 °Exit[employer]® Patient Information ©2015 [employer]. This information is not intended to replace advice given to you by your health care provider. Make sure you discuss any questions you have with your health care provider. ° °

## 2014-12-18 ENCOUNTER — Ambulatory Visit (INDEPENDENT_AMBULATORY_CARE_PROVIDER_SITE_OTHER): Payer: BLUE CROSS/BLUE SHIELD | Admitting: Gastroenterology

## 2014-12-18 ENCOUNTER — Encounter: Payer: Self-pay | Admitting: Gastroenterology

## 2014-12-18 ENCOUNTER — Other Ambulatory Visit: Payer: Self-pay

## 2014-12-18 VITALS — BP 129/86 | HR 60 | Temp 97.4°F | Ht 73.0 in | Wt 204.0 lb

## 2014-12-18 DIAGNOSIS — K219 Gastro-esophageal reflux disease without esophagitis: Secondary | ICD-10-CM

## 2014-12-18 DIAGNOSIS — K2270 Barrett's esophagus without dysplasia: Secondary | ICD-10-CM

## 2014-12-18 DIAGNOSIS — K625 Hemorrhage of anus and rectum: Secondary | ICD-10-CM

## 2014-12-18 MED ORDER — PEG-KCL-NACL-NASULF-NA ASC-C 100 G PO SOLR: 1.0000 | Freq: Once | ORAL | Status: DC

## 2014-12-18 NOTE — Assessment & Plan Note (Addendum)
SX CONTROLLED OFF MEDS. USES TOBACCO PRODUCTS.  DISCUSSED WITH PT AND WIFE WHY SCREENING IS INDICATED. EGD FOR BARRETT'S SCREENING FOR WHITE MALE AGE > 50-DISCUSSED PROCEDURE, BENEFITS, & RISKS: < 1% chance of medication reaction, OR bleeding.

## 2014-12-18 NOTE — Patient Instructions (Signed)
FULL LIQUIDS AND BOWEL PREP JAN 31. SEE INFO BELOW.  COLONOSCOPY WITH POSSIBLE HEMORRHOID BANDING AND UPPER ENDOSCOPY ON FEB 1.  Full Liquid Diet A high-calorie, high-protein supplement should be used to meet your nutritional requirements when the full liquid diet is continued for more than 2 or 3 days. If this diet is to be used for an extended period of time (more than 7 days), a multivitamin should be considered.  Breads and Starches  Allowed: None are allowed except crackers WHOLE OR pureed (made into a thick, smooth soup) in soup. Cooked, refined corn, oat, rice, rye, and wheat cereals are also allowed.   Avoid: Any others.    Potatoes/Pasta/Rice  Allowed: ANY ITEM AS A SOUP OR SMALL PLATE OF MASHED POTATOES OR RICE.       Vegetables  Allowed: Strained tomato or vegetable juice. Vegetables pureed in soup.   Avoid: Any others.    Fruit  Allowed: Any strained fruit juices and fruit drinks. Include 1 serving of citrus or vitamin C-enriched fruit juice daily.   Avoid: Any others.  Meat and Meat Substitutes  Allowed: Egg  Avoid: Any meat, fish, or fowl. All cheese.  Milk  Allowed: Milk beverages, including milk shakes and instant breakfast mixes. Smooth yogurt.   Avoid: Any others. Avoid dairy products if not tolerated.    Soups and Combination Foods  Allowed: Broth, strained cream soups. Strained, broth-based soups.   Avoid: Any others.    Desserts and Sweets  Allowed: flavored gelatin, tapioca, plain ice cream, sherbet, smooth pudding, junket, fruit ices, frozen ice pops, pudding pops,, frozen fudge pops, chocolate syrup. Sugar, honey, jelly, syrup.   Avoid: Any others.  Fats and Oils  Allowed: Margarine, butter, cream, sour cream, oils.   Avoid: Any others.  Beverages  Allowed: All.   Avoid: None.  Condiments  Allowed: Iodized salt, pepper, spices, flavorings. Cocoa powder.   Avoid: Any others.    SAMPLE MEAL PLAN Breakfast   cup orange  juice.   1 cup cooked wheat cereal.   1 cup  milk.   1 cup beverage (coffee or tea).   Cream or sugar, if desired.    Midmorning Snack  2 SCRAMBLED OR HARD BOILED EGG   Lunch  1 cup cream soup.    cup fruit juice.   1 cup milk.    cup custard.   1 cup beverage (coffee or tea).   Cream or sugar, if desired.    Midafternoon Snack  1 cup milk shake.  Dinner  1 cup cream soup.    cup fruit juice.   1 cup milk.    cup pudding.   1 cup beverage (coffee or tea).   Cream or sugar, if desired.  Evening Snack  1 cup supplement.  To increase calories, add sugar, cream, butter, or margarine if possible. Nutritional supplements will also increase the total calories.   HEMORRHOIDAL BANDING COMPLICATIONS:  COMMON: 1. MINOR PAIN  UNCOMMON: 1. ABSCESS 2. BAND FALLS OFF 3. PROLAPSE OF HEMORRHOIDS AND PAIN 4. ULCER BLEEDING  A. USUALLY SELF-LIMITED: MAY LAST 3-5 DAYS  B. MAY REQUIRE INTERVENTION: 1-2 WEEKS AFTER INTERACTIONS 5. NECROTIZING PELVIC SEPSIS  A. SYMPTOMS: FEVER, PAIN, DIFFICULTY URINATING

## 2014-12-18 NOTE — Assessment & Plan Note (Addendum)
MOST LIKELY DUE TO HEMORRHOIDS, LESS LIKELY COLON POLYP/RECTAL MASS.  SUPREP/FULL LIQUIDS JAN 31 TCS/?HEMORHROID BANDING FEB 1-DISCUSSED PROCEDURE, BENEFITS, & RISKS: < 1% chance of medication reaction, bleeding, perforation, PELVIC VEIN SEPSIS, or rupture of spleen/liver. FOLLOW UP IN 3 MOS.  TBS

## 2014-12-18 NOTE — Progress Notes (Signed)
   Subjective:    Patient ID: David Dougherty, male    DOB: Apr 13, 1951, 64 y.o.   MRN: [id number]  Sallee Lange, MD  HPI Has hemorrhoids 2-3x/mo. Sx: will fall otu and have to push them back in. MAY HAVE RECTAL BLEEDING. HAD TCS ?10 YRS AGO-DR. ROURK. THROAT STRETCHED BEFORE-NO PROBLEMS SWALLOWING. NO ETOH. DIPS BUT DOESN'T SMOKE. BMs: CAN GET OUT OF BREATH A LOT. BMs: 1-2X/DAY.  PT DENIES FEVER, CHILLS, nausea, vomiting, melena, diarrhea, CHEST PAIN, S  CHANGE IN BOWEL IN HABITS, constipation, abdominal pain, problems swallowing, problems with sedation, heartburn or indigestion.   Past Medical History  Diagnosis Date  . GERD (gastroesophageal reflux disease)   . Tobacco abuse     Snuff  . Atrial fibrillation    Past Surgical History  Procedure Laterality Date  . Appendectomy    . Tonsillectomy    . Colonoscopy  2005    Neg  . Left and right heart catheterization with coronary angiogram N/A 12/03/2014    Procedure: LEFT AND RIGHT HEART CATHETERIZATION WITH CORONARY ANGIOGRAM;  Surgeon: Blane Ohara, MD;  Location[facility] C[facility] LAB;  Service: Cardiovascular;  Laterality: N/A;   No Known Allergies  Current Outpatient Prescriptions  Medication Sig Dispense Refill  . aspirin 81 MG tablet Take 81 mg by mouth daily.    . carvedilol (COREG) 3.125 MG tablet Take 1 tablet (3.125 mg total) by mouth 2 (two) times daily. 180 tablet 3  . acetaminophen (TYLENOL) 500 MG tablet Take 500 mg by mouth every 6 (six) hours as needed (for pain).     . calcium carbonate (TUMS - DOSED IN MG ELEMENTAL CALCIUM) 500 MG chewable tablet Chew 1 tablet by mouth as needed for indigestion or heartburn.     No current facility-administered medications for this visit.    Family History  Problem Relation Age of Onset  . Heart attack Father   . Hypertension Father   . Diabetes Father   . Colon cancer Neg Hx   . Colon polyps Neg Hx     History  Substance Use Topics  . Smoking status: Former Smoker --  0.25 packs/day for 3 years    Types: 57, Cigarettes    Start date: 10/19/1969    Quit date: 10/22/1972  . Smokeless tobacco: Current User    Types: Snuff  . Alcohol Use: No   Review of Systems PER HPI OTHERWISE ALL SYSTEMS ARE NEGATIVE.     Objective:   Physical Exam  Constitutional: He is oriented to person, place, and time. He appears well-developed and well-nourished. No distress.  HENT:  Head: Normocephalic and atraumatic.  Mouth/Throat: Oropharynx is clear and moist. No oropharyngeal exudate.  Eyes: Pupils are equal, round, and reactive to light. No scleral icterus.  Neck: Normal range of motion. Neck supple.  Cardiovascular: Normal rate and regular rhythm.   Pulmonary/Chest: Effort normal and breath sounds normal. No respiratory distress.  Abdominal: Soft. Bowel sounds are normal. He exhibits no distension. There is no tenderness.  Musculoskeletal: He exhibits edema (TRACE BIL LE).  Lymphadenopathy:    He has no cervical adenopathy.  Neurological: He is alert and oriented to person, place, and time.  Psychiatric: He has a normal mood and affect.  Vitals reviewed.         Assessment & Plan:

## 2014-12-22 ENCOUNTER — Encounter: Payer: Self-pay | Admitting: Cardiology

## 2014-12-22 ENCOUNTER — Ambulatory Visit (INDEPENDENT_AMBULATORY_CARE_PROVIDER_SITE_OTHER): Payer: BLUE CROSS/BLUE SHIELD | Admitting: Cardiology

## 2014-12-22 VITALS — BP 150/80 | HR 55 | Ht 73.0 in | Wt 209.0 lb

## 2014-12-22 DIAGNOSIS — I10 Essential (primary) hypertension: Secondary | ICD-10-CM

## 2014-12-22 DIAGNOSIS — I25118 Atherosclerotic heart disease of native coronary artery with other forms of angina pectoris: Secondary | ICD-10-CM | POA: Insufficient documentation

## 2014-12-22 DIAGNOSIS — I429 Cardiomyopathy, unspecified: Secondary | ICD-10-CM

## 2014-12-22 DIAGNOSIS — I482 Chronic atrial fibrillation, unspecified: Secondary | ICD-10-CM

## 2014-12-22 DIAGNOSIS — I2510 Atherosclerotic heart disease of native coronary artery without angina pectoris: Secondary | ICD-10-CM

## 2014-12-22 MED ORDER — LOSARTAN POTASSIUM 25 MG PO TABS: 25.0000 mg | ORAL_TABLET | Freq: Every day | ORAL | Status: DC

## 2014-12-22 NOTE — Assessment & Plan Note (Signed)
Likely chronic. Continue Coreg for rate control, remains on aspirin for now.

## 2014-12-22 NOTE — Assessment & Plan Note (Signed)
Distal circumflex stenosis of 70%. Not clearly associated with patient's cardiomyopathy or atrial fibrillation. Continue aspirin and beta blocker. No active angina symptoms.

## 2014-12-22 NOTE — Patient Instructions (Signed)
Your physician recommends that you schedule a follow-up appointment in: 2 months with Dr.McDowell      START Cozaar 25 mg daily, PLEASE get lab work in [place]

## 2014-12-22 NOTE — Progress Notes (Signed)
Reason for visit: Atrial fibrillation, cardiomyopathy  Clinical Summary David Dougherty is a 64 y.o.male seen for the first time in December 2015. He was seen at that time for evaluation of probable chronic atrial fibrillation and newly diagnosed cardiomyopathy. Coreg was initiated.  Cardiac catheterization performed in December 2015 by Dr. Burt Knack was consistent with nonischemic cardiomyopathy No major obstructive CAD in the large epicardials, however 70% distal AV circumflex stenosis that was managed medically. Cardiac output was normal, PCWP 15, mean PA 19.  Echocardiogram from December 2015 showed moderate LVH with LVEF approximately 20%, diffuse hypokinesis, evidence of elevated filling pressures, mild aortic root dilatation and ascending aortic dilatation, mild mitral regurgitation, moderate left atrial enlargement and right atrial enlargement, moderately reduced RV contraction, mild tricuspid regurgitation, and PASP 32 mmHg.   CHADSVASC score is 1, annual risk of thromboembolic stroke with aspirin would be 1.2%.  He comes in today with his wife and daughter. Still feels generally well. We reviewed the results of his heart catheterization, discussed ongoing attempts at titration of medical therapy to address his cardiomyopathy. He reports NYHA class 1-2 dyspnea, no exertional chest pain or sense of palpitations.  No Known Allergies  Current Outpatient Prescriptions  Medication Sig Dispense Refill  . acetaminophen (TYLENOL) 500 MG tablet Take 500 mg by mouth every 6 (six) hours as needed (for pain).     Marland Kitchen aspirin 81 MG tablet Take 81 mg by mouth daily.    . calcium carbonate (TUMS - DOSED IN MG ELEMENTAL CALCIUM) 500 MG chewable tablet Chew 1 tablet by mouth as needed for indigestion or heartburn.    . carvedilol (COREG) 3.125 MG tablet Take 1 tablet (3.125 mg total) by mouth 2 (two) times daily. 180 tablet 3  . peg 3350 powder (MOVIPREP) 100 G SOLR Take 1 kit (200 g total) by mouth once. 1  kit 0  . losartan (COZAAR) 25 MG tablet Take 1 tablet (25 mg total) by mouth daily. 90 tablet 3   No current facility-administered medications for this visit.    Past Medical History  Diagnosis Date  . GERD (gastroesophageal reflux disease)   . Tobacco abuse     Snuff  . Atrial fibrillation     Past Surgical History  Procedure Laterality Date  . Appendectomy    . Tonsillectomy    . Colonoscopy  2005    Neg  . Left and right heart catheterization with coronary angiogram N/A 12/03/2014    Procedure: LEFT AND RIGHT HEART CATHETERIZATION WITH CORONARY ANGIOGRAM;  Surgeon: Blane Ohara, MD;  Location: [facility] CATH LAB;  Service: Cardiovascular;  Laterality: N/A;    Social History David Dougherty reports that he quit smoking about 42 years ago. His smoking use included Cigars and Cigarettes. He started smoking about 45 years ago. He has a .75 pack-year smoking history. His smokeless tobacco use includes Snuff. David Dougherty reports that he does not drink alcohol.  Review of Systems Complete review of systems negative except as otherwise outlined in the clinical summary and also the following. No sudden dizziness or syncope. No claudication. No orthopnea or PND.  Physical Examination Filed Vitals:   12/22/14 0928  BP: 150/80  Pulse: 55   Filed Weights   12/22/14 0928  Weight: 209 lb (94.802 kg)    Patient appears comfortable at rest. HEENT: Conjunctiva and lids normal, oropharynx clear with moist mucosa. Neck: Supple, no elevated JVP or carotid bruits, no thyromegaly. Lungs: Clear to auscultation, nonlabored breathing at rest. Cardiac:  Indistinct PMI, irregularly irregular, no S3, soft systolic murmur, no pericardial rub. Abdomen: Soft, nontender, bowel sounds present, no guarding or rebound. Extremities: 1-2+ edema, distal pulses 2+. Skin: Warm and dry. Musculoskeletal: No kyphosis. Neuropsychiatric: Alert and oriented x3, affect grossly appropriate.   Problem List and Plan    Secondary cardiomyopathy Nonischemic cardiomyopathy. Continue Coreg, add Cozaar 25 mg daily. Follow-up BMET in 2 weeks. Clinical visit in the next 2 months.   CAD (coronary artery disease), native coronary artery Distal circumflex stenosis of 70%. Not clearly associated with patient's cardiomyopathy or atrial fibrillation. Continue aspirin and beta blocker. No active angina symptoms.   Atrial fibrillation Likely chronic. Continue Coreg for rate control, remains on aspirin for now.     Satira Sark, M.D., F.A.C.C.

## 2014-12-22 NOTE — Assessment & Plan Note (Signed)
Nonischemic cardiomyopathy. Continue Coreg, add Cozaar 25 mg daily. Follow-up BMET in 2 weeks. Clinical visit in the next 2 months.

## 2014-12-23 ENCOUNTER — Other Ambulatory Visit: Payer: Self-pay

## 2014-12-23 MED ORDER — PEG 3350-KCL-NA BICARB-NACL 420 G PO SOLR: 4000.0000 mL | Freq: Once | ORAL | Status: DC

## 2015-01-05 ENCOUNTER — Ambulatory Visit (HOSPITAL_COMMUNITY)
Admission: RE | Admit: 2015-01-05 | Discharge: 2015-01-05 | Disposition: A | Discharge status: Home / Self Care | Payer: BLUE CROSS/BLUE SHIELD | Source: Ambulatory Visit | Attending: Gastroenterology | Admitting: Gastroenterology

## 2015-01-05 ENCOUNTER — Encounter (HOSPITAL_COMMUNITY)
Admission: RE | Disposition: A | Discharge status: Home / Self Care | Payer: Self-pay | Source: Ambulatory Visit | Attending: Gastroenterology

## 2015-01-05 ENCOUNTER — Encounter (HOSPITAL_COMMUNITY): Payer: Self-pay | Admitting: *Deleted

## 2015-01-05 DIAGNOSIS — Z87891 Personal history of nicotine dependence: Secondary | ICD-10-CM | POA: Insufficient documentation

## 2015-01-05 DIAGNOSIS — D124 Benign neoplasm of descending colon: Secondary | ICD-10-CM | POA: Insufficient documentation

## 2015-01-05 DIAGNOSIS — K2960 Other gastritis without bleeding: Secondary | ICD-10-CM | POA: Diagnosis not present

## 2015-01-05 DIAGNOSIS — K2980 Duodenitis without bleeding: Secondary | ICD-10-CM | POA: Insufficient documentation

## 2015-01-05 DIAGNOSIS — Z9889 Other specified postprocedural states: Secondary | ICD-10-CM | POA: Insufficient documentation

## 2015-01-05 DIAGNOSIS — D123 Benign neoplasm of transverse colon: Secondary | ICD-10-CM

## 2015-01-05 DIAGNOSIS — R609 Edema, unspecified: Secondary | ICD-10-CM | POA: Insufficient documentation

## 2015-01-05 DIAGNOSIS — K649 Unspecified hemorrhoids: Secondary | ICD-10-CM | POA: Diagnosis not present

## 2015-01-05 DIAGNOSIS — D122 Benign neoplasm of ascending colon: Secondary | ICD-10-CM

## 2015-01-05 DIAGNOSIS — K2270 Barrett's esophagus without dysplasia: Secondary | ICD-10-CM | POA: Insufficient documentation

## 2015-01-05 DIAGNOSIS — I4891 Unspecified atrial fibrillation: Secondary | ICD-10-CM | POA: Diagnosis not present

## 2015-01-05 DIAGNOSIS — K219 Gastro-esophageal reflux disease without esophagitis: Secondary | ICD-10-CM | POA: Insufficient documentation

## 2015-01-05 DIAGNOSIS — K222 Esophageal obstruction: Secondary | ICD-10-CM

## 2015-01-05 DIAGNOSIS — K621 Rectal polyp: Secondary | ICD-10-CM

## 2015-01-05 DIAGNOSIS — Z79899 Other long term (current) drug therapy: Secondary | ICD-10-CM | POA: Insufficient documentation

## 2015-01-05 DIAGNOSIS — K625 Hemorrhage of anus and rectum: Secondary | ICD-10-CM

## 2015-01-05 DIAGNOSIS — Z7982 Long term (current) use of aspirin: Secondary | ICD-10-CM | POA: Insufficient documentation

## 2015-01-05 DIAGNOSIS — K449 Diaphragmatic hernia without obstruction or gangrene: Secondary | ICD-10-CM | POA: Diagnosis not present

## 2015-01-05 HISTORY — PX: ESOPHAGOGASTRODUODENOSCOPY: SHX5428

## 2015-01-05 HISTORY — PX: HEMORRHOID BANDING: SHX5850

## 2015-01-05 HISTORY — PX: COLONOSCOPY: SHX5424

## 2015-01-05 SURGERY — COLONOSCOPY
Anesthesia: Moderate Sedation

## 2015-01-05 MED ORDER — MEPERIDINE HCL 100 MG/ML IJ SOLN
INTRAMUSCULAR | Status: DC | PRN
  Administered 2015-01-05 (×3): 25 mg via INTRAVENOUS

## 2015-01-05 MED ORDER — OMEPRAZOLE 20 MG PO CPDR: DELAYED_RELEASE_CAPSULE | ORAL | Status: DC

## 2015-01-05 MED ORDER — LIDOCAINE VISCOUS 2 % MT SOLN
OROMUCOSAL | Status: AC
  Filled 2015-01-05: qty 15

## 2015-01-05 MED ORDER — MIDAZOLAM HCL 5 MG/5ML IJ SOLN
INTRAMUSCULAR | Status: DC | PRN
  Administered 2015-01-05 (×3): 2 mg via INTRAVENOUS

## 2015-01-05 MED ORDER — MIDAZOLAM HCL 5 MG/5ML IJ SOLN
INTRAMUSCULAR | Status: AC
  Filled 2015-01-05: qty 10

## 2015-01-05 MED ORDER — MEPERIDINE HCL 100 MG/ML IJ SOLN
INTRAMUSCULAR | Status: AC
  Filled 2015-01-05: qty 2

## 2015-01-05 MED ORDER — SODIUM CHLORIDE 0.9 % IV SOLN
INTRAVENOUS | Status: DC
  Administered 2015-01-05: 08:00:00 via INTRAVENOUS

## 2015-01-05 MED ORDER — LIDOCAINE VISCOUS 2 % MT SOLN
OROMUCOSAL | Status: DC | PRN
  Administered 2015-01-05: 1 via OROMUCOSAL

## 2015-01-05 MED ORDER — STERILE WATER FOR IRRIGATION IR SOLN
Status: DC | PRN
  Administered 2015-01-05: 08:00:00

## 2015-01-05 NOTE — Op Note (Signed)
[place], [id number]   ENDOSCOPY PROCEDURE REPORT  PATIENT: David Dougherty, David Dougherty  MR#: [id number] BIRTHDATE: Aug 26, 1951 , 61  yrs. old GENDER: male  ENDOSCOPIST: Danie Binder, MD REFERRED PH:XTAVW Wolfgang Phoenix, M.D. PROCEDURE DATE: January 14, 2015 PROCEDURE:   EGD w/ biopsy  INDICATIONS:screening for Barrett's. MEDICATIONS: TCS+ Demerol 25 mg IV and Versed 2 mg IV TOPICAL ANESTHETIC:   Viscous Xylocaine ASA CLASS:  DESCRIPTION OF PROCEDURE:     Physical exam was performed.  Informed consent was obtained from the patient after explaining the benefits, risks, and alternatives to the procedure.  The patient was connected to the monitor and placed in the left lateral position.  Continuous oxygen was provided by nasal cannula and IV medicine administered through an indwelling cannula.  After administration of sedation, the patients esophagus was intubated and the EG-[id number] ([id number])  endoscope was advanced under direct visualization to the second portion of the duodenum.  The scope was removed slowly by carefully examining the color, texture, anatomy, and integrity of the mucosa on the way out.  The patient was recovered in endoscopy and discharged home in satisfactory condition.   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  COLD FORCEPS BIOPSIES OBTAINED.   STOMACH: A small hiatal hernia was noted.   Mild erosive gastritis (inflammation) was found in the entire examined stomach.  Multiple biopsies were performed using cold forceps.   DUODENUM: Mild duodenal inflammation was found in the duodenal bulb.   The duodenal mucosa showed no abnormalities in the 2nd part of the duodenum. COMPLICATIONS: There were no immediate complications.  ENDOSCOPIC IMPRESSION: 1.   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   Small hiatal hernia 4.   MILD Erosive gastritis AND DUODENITIS  RECOMMENDATIONS: ADD OMEPRAZOLE 30 MINS PRIOR TO BREAKFAST FOREVER. LOW FAT/HIGH FIBER DIET AWAIT BIOPSY OPV IN 3 WEEKS FOR HEMORRHOUD BANDING FOLLOW UP OPV IN 1 YEAR FOR ROUTINE FOLLOW UP/POSSIBLE REPEAT EGD FOR BARRETT'S SCREENING.  REPEAT EXAM: eSigned:  Danie Binder, MD 14-Jan-2015 9:58 AM   CPT CODES: ICD CODES:  The ICD and CPT codes recommended by this software are interpretations from the data that the clinical staff has captured with the software.  The verification of the translation of this report to the ICD and CPT codes and modifiers is the sole responsibility of the health care institution and practicing physician where this report was generated.  [employer] will not be held responsible for the validity of the ICD and CPT codes included on this report.  AMA assumes no liability for data contained or not contained herein. CPT is a Designer, television/film set of the [employer].

## 2015-01-05 NOTE — Interval H&P Note (Signed)
History and Physical Interval Note:  01/05/2015 7:58 AM  David Dougherty  has presented today for surgery, with the diagnosis of rectal bleeding, Barretts surveillance  The various methods of treatment have been discussed with the patient and family. After consideration of risks, benefits and other options for treatment, the patient has consented to  Procedure(s) with comments: COLONOSCOPY (N/A) - 830 ESOPHAGOGASTRODUODENOSCOPY (EGD) (N/A) HEMORRHOID BANDING (N/A) as a surgical intervention .  The patient's history has been reviewed, patient examined, no change in status, stable for surgery.  I have reviewed the patient's chart and labs.  Questions were answered to the patient's satisfaction.     [employer]

## 2015-01-05 NOTE — H&P (View-Only) (Signed)
   Subjective:    Patient ID: David Dougherty, male    DOB: 12/11/1950, 64 y.o.   MRN: [id number]  Sallee Lange, MD  HPI Has hemorrhoids 2-3x/mo. Sx: will fall otu and have to push them back in. MAY HAVE RECTAL BLEEDING. HAD TCS ?10 YRS AGO-DR. ROURK. THROAT STRETCHED BEFORE-NO PROBLEMS SWALLOWING. NO ETOH. DIPS BUT DOESN'T SMOKE. BMs: CAN GET OUT OF BREATH A LOT. BMs: 1-2X/DAY.  PT DENIES FEVER, CHILLS, nausea, vomiting, melena, diarrhea, CHEST PAIN, S  CHANGE IN BOWEL IN HABITS, constipation, abdominal pain, problems swallowing, problems with sedation, heartburn or indigestion.   Past Medical History  Diagnosis Date  . GERD (gastroesophageal reflux disease)   . Tobacco abuse     Snuff  . Atrial fibrillation    Past Surgical History  Procedure Laterality Date  . Appendectomy    . Tonsillectomy    . Colonoscopy  2005    Neg  . Left and right heart catheterization with coronary angiogram N/A 12/03/2014    Procedure: LEFT AND RIGHT HEART CATHETERIZATION WITH CORONARY ANGIOGRAM;  Surgeon: Blane Ohara, MD;  Location: [facility] CATH LAB;  Service: Cardiovascular;  Laterality: N/A;   No Known Allergies  Current Outpatient Prescriptions  Medication Sig Dispense Refill  . aspirin 81 MG tablet Take 81 mg by mouth daily.    . carvedilol (COREG) 3.125 MG tablet Take 1 tablet (3.125 mg total) by mouth 2 (two) times daily. 180 tablet 3  . acetaminophen (TYLENOL) 500 MG tablet Take 500 mg by mouth every 6 (six) hours as needed (for pain).     . calcium carbonate (TUMS - DOSED IN MG ELEMENTAL CALCIUM) 500 MG chewable tablet Chew 1 tablet by mouth as needed for indigestion or heartburn.     No current facility-administered medications for this visit.    Family History  Problem Relation Age of Onset  . Heart attack Father   . Hypertension Father   . Diabetes Father   . Colon cancer Neg Hx   . Colon polyps Neg Hx     History  Substance Use Topics  . Smoking status: Former Smoker --  0.25 packs/day for 3 years    Types: 67, Cigarettes    Start date: 10/19/1969    Quit date: 10/22/1972  . Smokeless tobacco: Current User    Types: Snuff  . Alcohol Use: No   Review of Systems PER HPI OTHERWISE ALL SYSTEMS ARE NEGATIVE.     Objective:   Physical Exam  Constitutional: He is oriented to person, place, and time. He appears well-developed and well-nourished. No distress.  HENT:  Head: Normocephalic and atraumatic.  Mouth/Throat: Oropharynx is clear and moist. No oropharyngeal exudate.  Eyes: Pupils are equal, round, and reactive to light. No scleral icterus.  Neck: Normal range of motion. Neck supple.  Cardiovascular: Normal rate and regular rhythm.   Pulmonary/Chest: Effort normal and breath sounds normal. No respiratory distress.  Abdominal: Soft. Bowel sounds are normal. He exhibits no distension. There is no tenderness.  Musculoskeletal: He exhibits edema (TRACE BIL LE).  Lymphadenopathy:    He has no cervical adenopathy.  Neurological: He is alert and oriented to person, place, and time.  Psychiatric: He has a normal mood and affect.  Vitals reviewed.         Assessment & Plan:

## 2015-01-05 NOTE — Discharge Instructions (Signed)
You had 17 polyps removed. YOU HAVE INTERNAL HEMORRHOIDS, WHICH CAN CAUSE RECTAL BLEEDING. I PLACED 3 BANDS TO PREVENT BLEEDING. 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 A SMALL HIATAL HERNIA AND MILD GASTRITIS/DUODENITIS. I BIOPSIED YOUR ESOPHAGUS AND STOMACH.    START OMEPRAZOLE once daily 30 MINUTES PRIOR TO BREAKFAST.  FOLLOW A HIGH FIBER/LOW FAT DIET. AVOID ITEMS THAT CAUSE BLOATING. SEE INFO BELOW.  YOUR BIOPSY RESULTS WILL BE AVAILABLE IN MY CHART AFTER FEB 4 OR MY OFFICE WILL CONTACT YOU IN 10-14 DAYS WITH YOUR RESULTS.   Follow up in 3 WEEKS.  Next colonoscopy in 1-3 years.     ENDOSCOPY Care After Read the instructions outlined below and refer to this sheet in the next week. These discharge instructions provide you with general information on caring for yourself after you leave the hospital. While your treatment has been planned according to the most current medical practices available, unavoidable complications occasionally occur. If you have any problems or questions after discharge, call DR. Gudrun Axe, [phone].  ACTIVITY  You may resume your regular activity, but move at a slower pace for the next 24 hours.   Take frequent rest periods for the next 24 hours.   Walking will help get rid of the air and reduce the bloated feeling in your belly (abdomen).   No driving for 24 hours (because of the medicine (anesthesia) used during the test).   You may shower.   Do not sign any important legal documents or operate any machinery for 24 hours (because of the anesthesia used during the test).    NUTRITION  Drink plenty of fluids.   You may resume your normal diet as instructed by your doctor.   Begin with a light meal and progress to your normal diet. Heavy or fried foods are harder to digest and may make you feel sick to your stomach (nauseated).   Avoid alcoholic beverages for 24 hours or as instructed.    MEDICATIONS  You may resume your normal medications.   WHAT  YOU CAN EXPECT TODAY  Some feelings of bloating in the abdomen.   Passage of more gas than usual.   Spotting of blood in your stool or on the toilet paper  .  IF YOU HAD POLYPS REMOVED DURING THE ENDOSCOPY:  Eat a soft diet IF YOU HAVE NAUSEA, BLOATING, ABDOMINAL PAIN, OR VOMITING.    FINDING OUT THE RESULTS OF YOUR TEST Not all test results are available during your visit. DR. Oneida Alar WILL CALL YOU WITHIN 14 DAYS OF YOUR PROCEDUE WITH YOUR RESULTS. Do not assume everything is normal if you have not heard from DR. Emmalie Haigh, CALL HER OFFICE AT [phone].  SEEK IMMEDIATE MEDICAL ATTENTION AND CALL THE OFFICE: [phone] IF:  You have more than a spotting of blood in your stool.   Your belly is swollen (abdominal distention).   You are nauseated or vomiting.   You have a temperature over 101F.   You have abdominal pain or discomfort that is severe or gets worse throughout the day.   HEMORRHOIDAL BANDING COMPLICATIONS:  COMMON: 1. MINOR PAIN  UNCOMMON: 1. ABSCESS 2. BAND FALLS OFF 3. PROLAPSE OF HEMORRHOIDS AND PAIN 4. ULCER BLEEDING A. USUALLY SELF-LIMITED: MAY LAST 3-5 DAYS B. MAY REQUIRE INTERVENTION: 1-2 WEEKS AFTER INTERACTIONS 5. NECROTIZING PELVIC SEPSIS A. SYMPTOMS: FEVER, PAIN, DIFFICULTY URINATING   Low-Fat Diet BREADS, CEREALS, PASTA, RICE, DRIED PEAS, AND BEANS These products are high in carbohydrates and most are low in fat. Therefore, they can be  increased in the diet as substitutes for fatty foods. They too, however, contain calories and should not be eaten in excess. Cereals can be eaten for snacks as well as for breakfast.   FRUITS AND VEGETABLES It is good to eat fruits and vegetables. Besides being sources of fiber, both are rich in vitamins and some minerals. They help you get the daily allowances of these nutrients. Fruits and vegetables can be used for snacks and desserts.  MEATS Limit lean meat, chicken, [place],  and fish to no more than 6 ounces per day. Beef, Pork, and Lamb Use lean cuts of beef, pork, and lamb. Lean cuts include:  Extra-lean ground beef.  Arm roast.  Sirloin tip.  Center-cut ham.  Round steak.  Loin chops.  Rump roast.  Tenderloin.  Trim all fat off the outside of meats before cooking. It is not necessary to severely decrease the intake of red meat, but lean choices should be made. Lean meat is rich in protein and contains a highly absorbable form of iron. Premenopausal women, in particular, should avoid reducing lean red meat because this could increase the risk for low red blood cells (iron-deficiency anemia).  Chicken and [place] These are good sources of protein. The fat of poultry can be reduced by removing the skin and underlying fat layers before cooking. Chicken and [place] can be substituted for lean red meat in the diet. Poultry should not be fried or covered with high-fat sauces. Fish and Shellfish Fish is a good source of protein. Shellfish contain cholesterol, but they usually are low in saturated fatty acids. The preparation of fish is important. Like chicken and [place], they should not be fried or covered with high-fat sauces. EGGS Egg whites contain no fat or cholesterol. They can be eaten often. Try 1 to 2 egg whites instead of whole eggs in recipes or use egg substitutes that do not contain yolk. MILK AND DAIRY PRODUCTS Use skim or 1% milk instead of 2% or whole milk. Decrease whole milk, natural, and processed cheeses. Use nonfat or low-fat (2%) cottage cheese or low-fat cheeses made from vegetable oils. Choose nonfat or low-fat (1 to 2%) yogurt. Experiment with evaporated skim milk in recipes that call for heavy cream. Substitute low-fat yogurt or low-fat cottage cheese for sour cream in dips and salad dressings. Have at least 2 servings of low-fat dairy products, such as 2 glasses of skim (or 1%) milk each day to help get your daily calcium intake. FATS AND  OILS Reduce the total intake of fats, especially saturated fat. Butterfat, lard, and beef fats are high in saturated fat and cholesterol. These should be avoided as much as possible. Vegetable fats do not contain cholesterol, but certain vegetable fats, such as coconut oil, palm oil, and palm kernel oil are very high in saturated fats. These should be limited. These fats are often used in bakery goods, processed foods, popcorn, oils, and nondairy creamers. Vegetable shortenings and some peanut butters contain hydrogenated oils, which are also saturated fats. Read the labels on these foods and check for saturated vegetable oils. Unsaturated vegetable oils and fats do not raise blood cholesterol. However, they should be limited because they are fats and are high in calories. Total fat should still be limited to 30% of your daily caloric intake. Desirable liquid vegetable oils are corn oil, cottonseed oil, olive oil, canola oil, safflower oil, soybean oil, and sunflower oil. Peanut oil is not as good, but small amounts are acceptable. Buy a heart-healthy  tub margarine that has no partially hydrogenated oils in the ingredients. Mayonnaise and salad dressings often are made from unsaturated fats, but they should also be limited because of their high calorie and fat content. Seeds, nuts, peanut butter, olives, and avocados are high in fat, but the fat is mainly the unsaturated type. These foods should be limited mainly to avoid excess calories and fat. OTHER EATING TIPS Snacks  Most sweets should be limited as snacks. They tend to be rich in calories and fats, and their caloric content outweighs their nutritional value. Some good choices in snacks are graham crackers, melba toast, soda crackers, bagels (no egg), English muffins, fruits, and vegetables. These snacks are preferable to snack crackers, [place] fries, TORTILLA CHIPS, and POTATO chips. Popcorn should be air-popped or cooked in small amounts of liquid  vegetable oil. Desserts Eat fruit, low-fat yogurt, and fruit ices instead of pastries, cake, and cookies. Sherbet, angel food cake, gelatin dessert, frozen low-fat yogurt, or other frozen products that do not contain saturated fat (pure fruit juice bars, frozen ice pops) are also acceptable.  COOKING METHODS Choose those methods that use little or no fat. They include: Poaching.  Braising.  Steaming.  Grilling.  Baking.  Stir-frying.  Broiling.  Microwaving.  Foods can be cooked in a nonstick pan without added fat, or use a nonfat cooking spray in regular cookware. Limit fried foods and avoid frying in saturated fat. Add moisture to lean meats by using water, broth, cooking wines, and other nonfat or low-fat sauces along with the cooking methods mentioned above. Soups and stews should be chilled after cooking. The fat that forms on top after a few hours in the refrigerator should be skimmed off. When preparing meals, avoid using excess salt. Salt can contribute to raising blood pressure in some people.  EATING AWAY FROM HOME Order entres, potatoes, and vegetables without sauces or butter. When meat exceeds the size of a deck of cards (3 to 4 ounces), the rest can be taken home for another meal. Choose vegetable or fruit salads and ask for low-calorie salad dressings to be served on the side. Use dressings sparingly. Limit high-fat toppings, such as bacon, crumbled eggs, cheese, sunflower seeds, and olives. Ask for heart-healthy tub margarine instead of butter.  High-Fiber Diet A high-fiber diet changes your normal diet to include more whole grains, legumes, fruits, and vegetables. Changes in the diet involve replacing refined carbohydrates with unrefined foods. The calorie level of the diet is essentially unchanged. The Dietary Reference Intake (recommended amount) for adult males is 38 grams per day. For adult females, it is 25 grams per day. Pregnant and lactating women should consume 28  grams of fiber per day. Fiber is the intact part of a plant that is not broken down during digestion. Functional fiber is fiber that has been isolated from the plant to provide a beneficial effect in the body. PURPOSE  Increase stool bulk.   Ease and regulate bowel movements.   Lower cholesterol.  INDICATIONS THAT YOU NEED MORE FIBER  Constipation and hemorrhoids.   Uncomplicated diverticulosis (intestine condition) and irritable bowel syndrome.   Weight management.   As a protective measure against hardening of the arteries (atherosclerosis), diabetes, and cancer.   GUIDELINES FOR INCREASING FIBER IN THE DIET  Start adding fiber to the diet slowly. A gradual increase of about 5 more grams (2 slices of whole-wheat bread, 2 servings of most fruits or vegetables, or 1 bowl of high-fiber cereal) per day is  best. Too rapid an increase in fiber may result in constipation, flatulence, and bloating.   Drink enough water and fluids to keep your urine clear or pale yellow. Water, juice, or caffeine-free drinks are recommended. Not drinking enough fluid may cause constipation.   Eat a variety of high-fiber foods rather than one type of fiber.   Try to increase your intake of fiber through using high-fiber foods rather than fiber pills or supplements that contain small amounts of fiber.   The goal is to change the types of food eaten. Do not supplement your present diet with high-fiber foods, but replace foods in your present diet.  INCLUDE A VARIETY OF FIBER SOURCES  Replace refined and processed grains with whole grains, canned fruits with fresh fruits, and incorporate other fiber sources. White rice, white breads, and most bakery goods contain little or no fiber.   Brown whole-grain rice, buckwheat oats, and many fruits and vegetables are all good sources of fiber. These include: broccoli, Brussels sprouts, cabbage, cauliflower, beets, sweet potatoes, white potatoes (skin on), carrots,  tomatoes, eggplant, squash, berries, fresh fruits, and dried fruits.   Cereals appear to be the richest source of fiber. Cereal fiber is found in whole grains and bran. Bran is the fiber-rich outer coat of cereal grain, which is largely removed in refining. In whole-grain cereals, the bran remains. In breakfast cereals, the largest amount of fiber is found in those with "bran" in their names. The fiber content is sometimes indicated on the label.   You may need to include additional fruits and vegetables each day.   In baking, for 1 cup white flour, you may use the following substitutions:   1 cup whole-wheat flour minus 2 tablespoons.   1/2 cup white flour plus 1/2 cup whole-wheat flour.    Polyps, Colon  A polyp is extra tissue that grows inside your body. Colon polyps grow in the large intestine. The large intestine, also called the colon, is part of your digestive system. It is a long, hollow tube at the end of your digestive tract where your body makes and stores stool. Most polyps are not dangerous. They are benign. This means they are not cancerous. But over time, some types of polyps can turn into cancer. Polyps that are smaller than a pea are usually not harmful. But larger polyps could someday become or may already be cancerous. To be safe, doctors remove all polyps and test them.   WHO GETS POLYPS? Anyone can get polyps, but certain people are more likely than others. You may have a greater chance of getting polyps if:  You are over 50.   You have had polyps before.   Someone in your family has had polyps.   Someone in your family has had cancer of the large intestine.   Find out if someone in your family has had polyps. You may also be more likely to get polyps if you:   Eat a lot of fatty foods   Smoke   Drink alcohol   Do not exercise  Eat too much   TREATMENT  The caregiver will remove the polyp during sigmoidoscopy or colonoscopy.  PREVENTION There is not one  sure way to prevent polyps. You might be able to lower your risk of getting them if you:  Eat more fruits and vegetables and less fatty food.   Do not smoke.   Avoid alcohol.   Exercise every day.   Lose weight if you are overweight.  Eating more calcium and folate can also lower your risk of getting polyps. Some foods that are rich in calcium are milk, cheese, and broccoli. Some foods that are rich in folate are chickpeas, kidney beans, and spinach.   Gastritis  Gastritis is an inflammation (the body's way of reacting to injury and/or infection) of the stomach. It is often caused by viral or bacterial (germ) infections. It can also be caused BY ASPIRIN, BC/GOODY POWDER'S, (IBUPROFEN) MOTRIN, OR ALEVE (NAPROXEN), chemicals (including alcohol), SPICY FOODS, and medications. This illness may be associated with generalized malaise (feeling tired, not well), UPPER ABDOMINAL STOMACH cramps, and fever. One common bacterial cause of gastritis is an organism known as H. Pylori. This can be treated with antibiotics.   Hemorrhoids Hemorrhoids are dilated (enlarged) veins around the rectum. Sometimes clots will form in the veins. This makes them swollen and painful. These are called thrombosed hemorrhoids. Causes of hemorrhoids include:  Constipation.   Straining to have a bowel movement.   HEAVY LIFTING HOME CARE INSTRUCTIONS  Eat a well balanced diet and drink 6 to 8 glasses of water every day to avoid constipation. You may also use a bulk laxative.   Avoid straining to have bowel movements.   Keep anal area dry and clean.   Do not use a donut shaped pillow or sit on the toilet for long periods. This increases blood pooling and pain.   Move your bowels when your body has the urge; this will require less straining and will decrease pain and pressure.

## 2015-01-05 NOTE — Op Note (Signed)
[place], [id number]   COLONOSCOPY PROCEDURE REPORT  PATIENT: Deaglan, Lile  MR#: [id number] BIRTHDATE: 1950/12/24 , 76  yrs. old GENDER: male ENDOSCOPIST: Danie Binder, MD REFERRED BE:EFEOF Wolfgang [place], M.D. PROCEDURE DATE:  2015/01/13 PROCEDURE:   Colonoscopy with cold biopsy polypectomy, Colonoscopy with snare polypectomy, and Hemorrhoidectomy via banding, clips or ligation INDICATIONS:hematochezia. MEDICATIONS: Demerol 50 mg IV and Versed 4 mg IV  DESCRIPTION OF PROCEDURE:    Physical exam was performed.  Informed consent was obtained from the patient after explaining the benefits, risks, and alternatives to procedure.  The patient was connected to monitor and placed in left lateral position. Continuous oxygen was provided by nasal cannula and IV medicine administered through an indwelling cannula.  After administration of sedation and rectal exam, the patients rectum was intubated and the EC-[id number] ([id number])  colonoscope was advanced under direct visualization to the ileum.  The scope was removed slowly by carefully examining the color, texture, anatomy, and integrity mucosa on the way out.  The patient was recovered in endoscopy and discharged home in satisfactory condition.    COLON FINDINGS: The examined terminal ileum appeared to be normal. , Multiple sessile polyps ranging from 2 to 23mm in size were found in the descending colon, rectum, ascending colon, and at the hepatic flexure.  A polypectomy was performed with cold forceps.  , Six sessile polyps ranging from 5 to 68mm in size were found in the descending colon(3), transverse colon(2), and at the hepatic flexure(3).  A polypectomy was performed using snare cautery.  , and Moderate sized hemorrhoids were found.   3 BANDS APPLIED.  PREP QUALITY: good.  CECAL W/D TIME: 39       minutes COMPLICATIONS: None  ENDOSCOPIC IMPRESSION: 1.   17 COLON POLYPS REMOVED 2.    Moderate sized hemorrhoids  RECOMMENDATIONS: Await biopsy HIGH FIBER DIET NEXT TCS IN 1-3 YEARS. OPV IN 3 WEEKS TO FOLLOW UP HEMORRHOID BANDING   eSigned:  Danie Binder, MD 01-13-2015 9:39 AM  CPT CODES: ICD CODES:  The ICD and CPT codes recommended by this software are interpretations from the data that the clinical staff has captured with the software.  The verification of the translation of this report to the ICD and CPT codes and modifiers is the sole responsibility of the health care institution and practicing physician where this report was generated.  [employer] will not be held responsible for the validity of the ICD and CPT codes included on this report.  AMA assumes no liability for data contained or not contained herein. CPT is a Designer, television/film set of the [employer].

## 2015-01-06 ENCOUNTER — Encounter (HOSPITAL_COMMUNITY): Payer: Self-pay | Admitting: Gastroenterology

## 2015-01-12 LAB — BASIC METABOLIC PANEL
BUN: 9 mg/dL (ref 6–23)
CO2: 26 mEq/L (ref 19–32)
Calcium: 9.1 mg/dL (ref 8.4–10.5)
Chloride: 105 mEq/L (ref 96–112)
Creat: 0.96 mg/dL (ref 0.50–1.35)
Glucose, Bld: 118 mg/dL — ABNORMAL HIGH (ref 70–99)
Potassium: 4.3 mEq/L (ref 3.5–5.3)
Sodium: 140 mEq/L (ref 135–145)

## 2015-01-14 ENCOUNTER — Telehealth: Payer: Self-pay | Admitting: Gastroenterology

## 2015-01-14 NOTE — Telephone Encounter (Signed)
Reminder in epic °

## 2015-01-14 NOTE — Telephone Encounter (Signed)
Please call pt. He had EIGHT simple adenomas AND 9 HYPERPLASTIC POLYPS removed. The esophagus biopsies show Barrett's. Please call pt. His stomach Bx shows gastritis.   CONTINUE OMEPRAZOLE once daily 30 MINUTES PRIOR TO BREAKFAST.  FOLLOW A HIGH FIBER/LOW FAT DIET. AVOID ITEMS THAT CAUSE BLOATING.   Follow up in FEB 2016.  Repeat EGD in 1 year for BARRETT'S surveillance.  Next colonoscopy in 3 years.

## 2015-01-15 ENCOUNTER — Encounter: Payer: Self-pay | Admitting: Adult Health

## 2015-01-15 ENCOUNTER — Encounter: Payer: BLUE CROSS/BLUE SHIELD | Admitting: Adult Health

## 2015-01-15 NOTE — Progress Notes (Signed)
    Error NO show  

## 2015-01-15 NOTE — Telephone Encounter (Signed)
PT's wife was informed and info mailed to pt.

## 2015-01-15 NOTE — Telephone Encounter (Signed)
LM for pt to call

## 2015-01-20 ENCOUNTER — Ambulatory Visit: Payer: Self-pay | Admitting: Family Medicine

## 2015-01-26 ENCOUNTER — Ambulatory Visit: Payer: BLUE CROSS/BLUE SHIELD | Admitting: Gastroenterology

## 2015-02-26 ENCOUNTER — Encounter: Payer: Self-pay | Admitting: Cardiology

## 2015-02-26 ENCOUNTER — Ambulatory Visit (INDEPENDENT_AMBULATORY_CARE_PROVIDER_SITE_OTHER): Payer: BLUE CROSS/BLUE SHIELD | Admitting: Cardiology

## 2015-02-26 VITALS — BP 118/78 | HR 67 | Ht 73.0 in | Wt 209.0 lb

## 2015-02-26 DIAGNOSIS — I482 Chronic atrial fibrillation, unspecified: Secondary | ICD-10-CM

## 2015-02-26 DIAGNOSIS — I429 Cardiomyopathy, unspecified: Secondary | ICD-10-CM

## 2015-02-26 DIAGNOSIS — I428 Other cardiomyopathies: Secondary | ICD-10-CM

## 2015-02-26 DIAGNOSIS — I2510 Atherosclerotic heart disease of native coronary artery without angina pectoris: Secondary | ICD-10-CM | POA: Diagnosis not present

## 2015-02-26 MED ORDER — SPIRONOLACTONE 25 MG PO TABS: 25.0000 mg | ORAL_TABLET | Freq: Every day | ORAL | Status: DC

## 2015-02-26 NOTE — Progress Notes (Signed)
Cardiology Office Note  Date: 02/26/2015   ID: Linton, Stolp September 10, 1951, MRN [id number]  PCP: Sallee Lange, MD  Primary Cardiologist: Rozann Lesches, MD   Chief Complaint  Patient presents with  . Atrial Fibrillation  . Cardiomyopathy  . Coronary Artery Disease    History of Present Illness: David Dougherty is a 64 y.o. male last seen in January. He is here with his wife today for a follow-up visit. Overall he is without many specific complaints, his wife states that he seems "tired." He does not feel any palpitations, reports NYHA class II dyspnea. He does notice leg edema when he has been up on his feet all day, gone by the morning. We have not had him on a diuretic so far.  His echocardiogram from December is outlined below. Today we discussed arranging a follow-up study to reassess LVEF. I am hopeful that with medical therapy and heart rate control he has shown some improvement in systolic function. We also addressed adding Aldactone.   Past Medical History  Diagnosis Date  . GERD (gastroesophageal reflux disease)   . Tobacco abuse     Snuff  . Atrial fibrillation   . Nonischemic cardiomyopathy   . CAD (coronary artery disease)     70% distal circumflex 11/2014     Past Surgical History  Procedure Laterality Date  . Appendectomy    . Tonsillectomy    . Colonoscopy  2005    Neg  . Left and right heart catheterization with coronary angiogram N/A 12/03/2014    Procedure: LEFT AND RIGHT HEART CATHETERIZATION WITH CORONARY ANGIOGRAM;  Surgeon: Blane Ohara, MD;  Location: [facility] CATH LAB;  Service: Cardiovascular;  Laterality: N/A;  . Colonoscopy N/A 01/05/2015    SLF:17 colon polyps removed/moderate sized hemorrhoids  . 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 Hemorrhoid banding N/A 01/05/2015    Procedure: HEMORRHOID BANDING;  Surgeon: Danie Binder, MD;  Location: [facility] ENDO SU[facility];  Service: Endoscopy;  Laterality: N/A;    Current Outpatient Prescriptions  Medication Sig Dispense Refill  . acetaminophen (TYLENOL) 500 MG tablet Take 500 mg by mouth every 6 (six) hours as needed (for pain).     Marland Kitchen aspirin 81 MG tablet Take 81 mg by mouth daily.    . calcium carbonate (TUMS - DOSED IN MG ELEMENTAL CALCIUM) 500 MG chewable tablet Chew 1 tablet by mouth as needed for indigestion or heartburn.    . carvedilol (COREG) 3.125 MG tablet Take 1 tablet (3.125 mg total) by mouth 2 (two) times daily. 180 tablet 3  . losartan (COZAAR) 25 MG tablet Take 1 tablet (25 mg total) by mouth daily. 90 tablet 3  . omeprazole (PRILOSEC) 20 MG capsule 1 po 30 mins prior to breakfast 30 capsule 11  . spironolactone (ALDACTONE) 25 MG tablet Take 1 tablet (25 mg total) by mouth daily. 90 tablet 3   No current facility-administered medications for this visit.    Allergies:  Review of patient's allergies indicates no known allergies.   Social History: The patient  reports that he quit smoking about 42 years ago. His smoking use included Cigars and Cigarettes. He started smoking about 45 years ago. He has a .75 pack-year smoking history. His smokeless tobacco use includes Snuff. He reports that he does not drink alcohol or use illicit drugs.   ROS:  Please see the history of present illness. Otherwise, complete review  of systems is positive for none.  All other systems are reviewed and negative.   Physical Exam: VS:  BP 118/78 mmHg  Pulse 67  Ht 6\' 1"  (1.854 m)  Wt 209 lb (94.802 kg)  BMI 27.58 kg/m2  SpO2 99%, BMI Body mass index is 27.58 kg/(m^2).  Wt Readings from Last 3 Encounters:  02/26/15 209 lb (94.802 kg)  01/05/15 209 lb (94.802 kg)  12/22/14 209 lb (94.802 kg)     Patient appears comfortable at rest. HEENT: Conjunctiva and lids normal, oropharynx clear with moist mucosa. Neck: Supple, no elevated JVP or carotid bruits, no  thyromegaly. Lungs: Clear to auscultation, nonlabored breathing at rest. Cardiac: Indistinct PMI, irregularly irregular, no S3, soft systolic murmur, no pericardial rub. Abdomen: Soft, nontender, bowel sounds present, no guarding or rebound. Extremities: 1-2+ edema, distal pulses 2+. Skin: Warm and dry. Musculoskeletal: No kyphosis. Neuropsychiatric: Alert and oriented x3, affect grossly appropriate.   ECG: ECG is not ordered today.  Recent Labwork: 11/13/2014: ALT 18; AST 18; TSH 2.594 12/03/2014: Hemoglobin 14.6; Platelets 181 01/12/2015: BUN 9; Creatinine 0.96; Potassium 4.3; Sodium 140     Component Value Date/Time   CHOL 179 11/13/2014 0930   TRIG 72 11/13/2014 0930   TRIG 50 08/12/2010 0000   HDL 41 11/13/2014 0930   CHOLHDL 4.4 11/13/2014 0930   VLDL 14 11/13/2014 0930   LDLCALC 124* 11/13/2014 0930   LDLCALC 109 08/12/2010 0000    Other Studies Reviewed Today:  1. Echocardiogram from December 2015 showed moderate LVH with LVEF approximately 20%, diffuse hypokinesis, evidence of elevated filling pressures, mild aortic root dilatation and ascending aortic dilatation, mild mitral regurgitation, moderate left atrial enlargement and right atrial enlargement, moderately reduced RV contraction, mild tricuspid regurgitation, and PASP 32 mmHg.   2. Cardiac catheterization performed in December 2015 by Dr. Burt Knack was consistent with nonischemic cardiomyopathy No major obstructive CAD in the large epicardials, however 70% distal AV circumflex stenosis that was managed medically. Cardiac output was normal, PCWP 15, mean PA 19.  ASSESSMENT AND PLAN:  1. Nonischemic cardiomyopathy with LVEF approximately 20% based on workup so far. Patient is relatively stable from a symptom perspective, is having leg edema. We will add Aldactone 25 mg daily with follow-up BMET in a few weeks. Also reassess LV function with echocardiogram.  2. CAD without active angina, distal circumflex disease  documented in December 2015, being managed medically. Can you aspirin and beta blocker.  3. Persistent atrial fibrillation, duration uncertain. Continue beta blocker for heart rate control, aspirin for now. We will continue to discuss with him the possibility of anticoagulation and pursuing a cardioversion if LVEF remains reduced.    Current medicines were reviewed at length with the patient today.   Orders Placed This Encounter  Procedures  . Basic Metabolic Panel (BMET)  . 2D Echocardiogram with contrast    Disposition: FU with me in 2 months.   Signed, Satira Sark, MD, [facility] 02/26/2015 8:42 AM    [facility] at [facility] [address] [place], [place] [address] Phone: [phone]; Fax: [phone]

## 2015-02-26 NOTE — Patient Instructions (Signed)
Your physician recommends that you schedule a follow-up appointment in: 2 months    Your physician has recommended you make the following change in your medication:     START Aldactone 25 mg daily  Please get lab work in 2 weeks    Your physician has requested that you have an echocardiogram. Echocardiography is a painless test that uses sound waves to create images of your heart. It provides your doctor with information about the size and shape of your heart and how well your heart's chambers and valves are working. This procedure takes approximately one hour. There are no restrictions for this procedure.       Thank you for choosing [facility] !

## 2015-03-02 ENCOUNTER — Encounter: Payer: Self-pay | Admitting: Family Medicine

## 2015-03-12 ENCOUNTER — Ambulatory Visit (HOSPITAL_COMMUNITY)
Admission: RE | Admit: 2015-03-12 | Discharge: 2015-03-12 | Disposition: A | Discharge status: Home / Self Care | Payer: BLUE CROSS/BLUE SHIELD | Source: Ambulatory Visit | Attending: Cardiology | Admitting: Cardiology

## 2015-03-12 ENCOUNTER — Other Ambulatory Visit (HOSPITAL_COMMUNITY)
Admission: RE | Admit: 2015-03-12 | Discharge: 2015-03-12 | Disposition: A | Discharge status: Home / Self Care | Payer: BLUE CROSS/BLUE SHIELD | Source: Ambulatory Visit | Attending: Cardiology | Admitting: Cardiology

## 2015-03-12 DIAGNOSIS — I428 Other cardiomyopathies: Secondary | ICD-10-CM

## 2015-03-12 DIAGNOSIS — I429 Cardiomyopathy, unspecified: Secondary | ICD-10-CM | POA: Insufficient documentation

## 2015-03-12 DIAGNOSIS — I4891 Unspecified atrial fibrillation: Secondary | ICD-10-CM | POA: Diagnosis not present

## 2015-03-12 LAB — BASIC METABOLIC PANEL
Anion gap: 5 (ref 5–15)
BUN: 17 mg/dL (ref 6–23)
CO2: 28 mmol/L (ref 19–32)
Calcium: 9.3 mg/dL (ref 8.4–10.5)
Chloride: 103 mmol/L (ref 96–112)
Creatinine, Ser: 1.01 mg/dL (ref 0.50–1.35)
GFR calc Af Amer: 89 mL/min — ABNORMAL LOW (ref >90)
GFR calc non Af Amer: 77 mL/min — ABNORMAL LOW (ref >90)
Glucose, Bld: 106 mg/dL — ABNORMAL HIGH (ref 70–99)
Potassium: 4.6 mmol/L (ref 3.5–5.1)
Sodium: 136 mmol/L (ref 135–145)

## 2015-03-12 NOTE — Progress Notes (Signed)
  Echocardiogram 2D Echocardiogram has been performed.  Samuel Germany 03/12/2015, 9:11 AM

## 2015-04-09 ENCOUNTER — Ambulatory Visit: Payer: BLUE CROSS/BLUE SHIELD | Admitting: Family Medicine

## 2015-04-10 ENCOUNTER — Ambulatory Visit (INDEPENDENT_AMBULATORY_CARE_PROVIDER_SITE_OTHER): Payer: BLUE CROSS/BLUE SHIELD | Admitting: Family Medicine

## 2015-04-10 ENCOUNTER — Encounter: Payer: Self-pay | Admitting: Family Medicine

## 2015-04-10 VITALS — BP 132/86 | Temp 98.6°F | Wt 210.0 lb

## 2015-04-10 DIAGNOSIS — Z23 Encounter for immunization: Secondary | ICD-10-CM

## 2015-04-10 DIAGNOSIS — I429 Cardiomyopathy, unspecified: Secondary | ICD-10-CM | POA: Diagnosis not present

## 2015-04-10 DIAGNOSIS — L0100 Impetigo, unspecified: Secondary | ICD-10-CM

## 2015-04-10 MED ORDER — SULFAMETHOXAZOLE-TRIMETHOPRIM 800-160 MG PO TABS: 1.0000 | ORAL_TABLET | Freq: Two times a day (BID) | ORAL | Status: DC

## 2015-04-10 NOTE — Progress Notes (Signed)
   Subjective:    Patient ID: David Dougherty, male    DOB: [birth year], 64 y.o.   MRN: [id number]  HPIBlister on nose. Came up about 1 month ago. Tried triamcinolone cream.  No crackles lungs are clear impetigo noted on the nasal area heart irregular pulse normal blood pressure good   Review of Systems    denies PND denies orthopnea denies fever chills sweats denies excessive swelling in the legs Objective:   Physical Exam Impetigo of the nose recommend antibiotics prescription given Lungs clear heart regular trace edema in the ankles  Pneumonia vaccine given because cardiomyopathy     Assessment & Plan:  Poor ejection fraction with noncompliance of medication. Patient felt like he didn't really seem to get better with the medicine so he stopped taking it. I discussed with him the importance of minimizing salt he will start back his medicine gradually at half dose if he tolerates that he will gradually moved toward full dose, patient does have follow-up with cardiology  Impetigo antibiotics prescribed warning signs discussed follow-up if problems

## 2015-04-10 NOTE — Patient Instructions (Signed)
Restart Carvidilol 3.125 - at 1/2 pill twice a day  Restart the others at half dose gradually  Then go to taking the full dose within 3 weeks  Call me if prolems   Heart Failure Heart failure means your heart has trouble pumping blood. This makes it hard for your body to work well. Heart failure is usually a long-term (chronic) condition. You must take good care of yourself and follow your doctor's treatment plan. HOME CARE  Take your heart medicine as told by your doctor.  Do not stop taking medicine unless your doctor tells you to.  Do not skip any dose of medicine.  Refill your medicines before they run out.  Take other medicines only as told by your doctor or pharmacist.  Stay active if told by your doctor. The elderly and people with severe heart failure should talk with a doctor about physical activity.  Eat heart-healthy foods. Choose foods that are without trans fat and are low in saturated fat, cholesterol, and salt (sodium). This includes fresh or frozen fruits and vegetables, fish, lean meats, fat-free or low-fat dairy foods, whole grains, and high-fiber foods. Lentils and dried peas and beans (legumes) are also good choices.  Limit salt if told by your doctor.  Cook in a healthy way. Roast, grill, broil, bake, poach, steam, or stir-fry foods.  Limit fluids as told by your doctor.  Weigh yourself every morning. Do this after you pee (urinate) and before you eat breakfast. Write down your weight to give to your doctor.  Take your blood pressure and write it down if your doctor tells you to.  Ask your doctor how to check your pulse. Check your pulse as told.  Lose weight if told by your doctor.  Stop smoking or chewing tobacco. Do not use gum or patches that help you quit without your doctor's approval.  Schedule and go to doctor visits as told.  Nonpregnant women should have no more than 1 drink a day. Men should have no more than 2 drinks a day. Talk to your  doctor about drinking alcohol.  Stop illegal drug use.  Stay current with shots (immunizations).  Manage your health conditions as told by your doctor.  Learn to manage your stress.  Rest when you are tired.  If it is really hot outside:  Avoid intense activities.  Use air conditioning or fans, or get in a cooler place.  Avoid caffeine and alcohol.  Wear loose-fitting, lightweight, and light-colored clothing.  If it is really cold outside:  Avoid intense activities.  Layer your clothing.  Wear mittens or gloves, a hat, and a scarf when going outside.  Avoid alcohol.  Learn about heart failure and get support as needed.  Get help to maintain or improve your quality of life and your ability to care for yourself as needed. GET HELP IF:   You gain 03 lb/1.4 kg or more in 1 day or 05 lb/2.3 kg in a week.  You are more short of breath than usual.  You cannot do your normal activities.  You tire easily.  You cough more than normal, especially with activity.  You have any or more puffiness (swelling) in areas such as your hands, feet, ankles, or belly (abdomen).  You cannot sleep because it is hard to breathe.  You feel like your heart is beating fast (palpitations).  You get dizzy or light-headed when you stand up. GET HELP RIGHT AWAY IF:   You have trouble breathing.  There is a change in mental status, such as becoming less alert or not being able to focus.  You have chest pain or discomfort.  You faint. MAKE SURE YOU:   Understand these instructions.  Will watch your condition.  Will get help right away if you are not doing well or get worse. Document Released: 08/30/2008 Document Revised: 04/07/2014 Document Reviewed: 01/07/2013 [facility] Patient Information 2015 [place], [place]. This information is not intended to replace advice given to you by your health care provider. Make sure you discuss any questions you have with your health care  provider. DASH Eating Plan DASH stands for "Dietary Approaches to Stop Hypertension." The DASH eating plan is a healthy eating plan that has been shown to reduce high blood pressure (hypertension). Additional health benefits may include reducing the risk of type 2 diabetes mellitus, heart disease, and stroke. The DASH eating plan may also help with weight loss. WHAT DO I NEED TO KNOW ABOUT THE DASH EATING PLAN? For the DASH eating plan, you will follow these general guidelines:  Choose foods with a percent daily value for sodium of less than 5% (as listed on the food label).  Use salt-free seasonings or herbs instead of table salt or sea salt.  Check with your health care provider or pharmacist before using salt substitutes.  Eat lower-sodium products, often labeled as "lower sodium" or "no salt added."  Eat fresh foods.  Eat more vegetables, fruits, and low-fat dairy products.  Choose whole grains. Look for the word "whole" as the first word in the ingredient list.  Choose fish and skinless chicken or [place] more often than red meat. Limit fish, poultry, and meat to 6 oz (170 g) each day.  Limit sweets, desserts, sugars, and sugary drinks.  Choose heart-healthy fats.  Limit cheese to 1 oz (28 g) per day.  Eat more home-cooked food and less restaurant, buffet, and fast food.  Limit fried foods.  Cook foods using methods other than frying.  Limit canned vegetables. If you do use them, rinse them well to decrease the sodium.  When eating at a restaurant, ask that your food be prepared with less salt, or no salt if possible. WHAT FOODS CAN I EAT? Seek help from a dietitian for individual calorie needs. Grains Whole grain or whole wheat bread. Brown rice. Whole grain or whole wheat pasta. Quinoa, bulgur, and whole grain cereals. Low-sodium cereals. Corn or whole wheat flour tortillas. Whole grain cornbread. Whole grain crackers. Low-sodium crackers. Vegetables Fresh or frozen  vegetables (raw, steamed, roasted, or grilled). Low-sodium or reduced-sodium tomato and vegetable juices. Low-sodium or reduced-sodium tomato sauce and paste. Low-sodium or reduced-sodium canned vegetables.  Fruits All fresh, canned (in natural juice), or frozen fruits. Meat and Other Protein Products Ground beef (85% or leaner), grass-fed beef, or beef trimmed of fat. Skinless chicken or [place]. Ground chicken or [place]. Pork trimmed of fat. All fish and seafood. Eggs. Dried beans, peas, or lentils. Unsalted nuts and seeds. Unsalted canned beans. Dairy Low-fat dairy products, such as skim or 1% milk, 2% or reduced-fat cheeses, low-fat ricotta or cottage cheese, or plain low-fat yogurt. Low-sodium or reduced-sodium cheeses. Fats and Oils Tub margarines without trans fats. Light or reduced-fat mayonnaise and salad dressings (reduced sodium). Avocado. Safflower, olive, or canola oils. Natural peanut or almond butter. Other Unsalted popcorn and pretzels. The items listed above may not be a complete list of recommended foods or beverages. Contact your dietitian for more options. WHAT FOODS ARE NOT RECOMMENDED?  Grains White bread. White pasta. White rice. Refined cornbread. Bagels and croissants. Crackers that contain trans fat. Vegetables Creamed or fried vegetables. Vegetables in a cheese sauce. Regular canned vegetables. Regular canned tomato sauce and paste. Regular tomato and vegetable juices. Fruits Dried fruits. Canned fruit in light or heavy syrup. Fruit juice. Meat and Other Protein Products Fatty cuts of meat. Ribs, chicken wings, bacon, sausage, bologna, salami, chitterlings, fatback, hot dogs, bratwurst, and packaged luncheon meats. Salted nuts and seeds. Canned beans with salt. Dairy Whole or 2% milk, cream, half-and-half, and cream cheese. Whole-fat or sweetened yogurt. Full-fat cheeses or blue cheese. Nondairy creamers and whipped toppings. Processed cheese, cheese spreads, or cheese  curds. Condiments Onion and garlic salt, seasoned salt, table salt, and sea salt. Canned and packaged gravies. Worcestershire sauce. Tartar sauce. Barbecue sauce. Teriyaki sauce. Soy sauce, including reduced sodium. Steak sauce. Fish sauce. Oyster sauce. Cocktail sauce. Horseradish. Ketchup and mustard. Meat flavorings and tenderizers. Bouillon cubes. Hot sauce. Tabasco sauce. Marinades. Taco seasonings. Relishes. Fats and Oils Butter, stick margarine, lard, shortening, ghee, and bacon fat. Coconut, palm kernel, or palm oils. Regular salad dressings. Other Pickles and olives. Salted popcorn and pretzels. The items listed above may not be a complete list of foods and beverages to avoid. Contact your dietitian for more information. WHERE CAN I FIND MORE INFORMATION? National Heart, Lung, and Blood Institute: travelstabloid.com Document Released: 11/10/2011 Document Revised: 04/07/2014 Document Reviewed: 09/25/2013 [facility] Patient Information 2015 [place], [place]. This information is not intended to replace advice given to you by your health care provider. Make sure you discuss any questions you have with your health care provider.

## 2015-04-13 ENCOUNTER — Telehealth: Payer: Self-pay | Admitting: Family Medicine

## 2015-04-13 DIAGNOSIS — R252 Cramp and spasm: Secondary | ICD-10-CM

## 2015-04-13 NOTE — Telephone Encounter (Signed)
Left message to return call 

## 2015-04-13 NOTE — Telephone Encounter (Signed)
Because of the muscle cramps it would be reasonable to check a TSH, metabolic 7, magnesium. In addition to this of patient condition gentle stretching exercises of his calf muscles every day especially in the evening. In addition to this he may use mustard powder half teaspoon mixed in with his food daily finally if not getting better over the course of the next 7-10 days and if lab work looks good then consider mild muscle relaxer in the evening time.

## 2015-04-13 NOTE — Telephone Encounter (Signed)
Patient was seen on 5/6 in office but on Saturday night had real bad leg cramps in both legs.Wanting to know what he can take for leg cramps. [employer].

## 2015-04-13 NOTE — Telephone Encounter (Signed)
Blood work orders place in [employer]

## 2015-04-13 NOTE — Telephone Encounter (Signed)
Discussed-verbalized understanding-will have blood work drawn and try mustard powder. Patient will give [place] an update in 10-14 days.

## 2015-04-15 ENCOUNTER — Encounter: Payer: Self-pay | Admitting: Family Medicine

## 2015-04-15 LAB — BASIC METABOLIC PANEL
BUN/Creatinine Ratio: 12 (ref 10–22)
BUN: 14 mg/dL (ref 8–27)
CO2: 21 mmol/L (ref 18–29)
Calcium: 9 mg/dL (ref 8.6–10.2)
Chloride: 101 mmol/L (ref 97–108)
Creatinine, Ser: 1.15 mg/dL (ref 0.76–1.27)
GFR calc Af Amer: 77 mL/min/{1.73_m2} (ref >59)
GFR calc non Af Amer: 67 mL/min/{1.73_m2} (ref >59)
Glucose: 106 mg/dL — ABNORMAL HIGH (ref 65–99)
Potassium: 4.5 mmol/L (ref 3.5–5.2)
Sodium: 140 mmol/L (ref 134–144)

## 2015-04-15 LAB — MAGNESIUM: Magnesium: 2.1 mg/dL (ref 1.6–2.3)

## 2015-04-15 LAB — TSH: TSH: 3.21 u[IU]/mL (ref 0.450–4.500)

## 2015-05-06 ENCOUNTER — Encounter (HOSPITAL_COMMUNITY): Payer: Self-pay | Admitting: *Deleted

## 2015-05-06 ENCOUNTER — Observation Stay (HOSPITAL_COMMUNITY)
Admission: EM | Admit: 2015-05-06 | Discharge: 2015-05-07 | Disposition: A | Discharge status: Home / Self Care | Payer: BLUE CROSS/BLUE SHIELD | Attending: Internal Medicine | Admitting: Internal Medicine

## 2015-05-06 ENCOUNTER — Emergency Department (HOSPITAL_COMMUNITY): Payer: BLUE CROSS/BLUE SHIELD

## 2015-05-06 DIAGNOSIS — Z87891 Personal history of nicotine dependence: Secondary | ICD-10-CM | POA: Diagnosis not present

## 2015-05-06 DIAGNOSIS — I10 Essential (primary) hypertension: Secondary | ICD-10-CM

## 2015-05-06 DIAGNOSIS — R0602 Shortness of breath: Secondary | ICD-10-CM | POA: Diagnosis not present

## 2015-05-06 DIAGNOSIS — K210 Gastro-esophageal reflux disease with esophagitis: Secondary | ICD-10-CM

## 2015-05-06 DIAGNOSIS — I4891 Unspecified atrial fibrillation: Secondary | ICD-10-CM | POA: Diagnosis not present

## 2015-05-06 DIAGNOSIS — R079 Chest pain, unspecified: Principal | ICD-10-CM | POA: Insufficient documentation

## 2015-05-06 DIAGNOSIS — Z7982 Long term (current) use of aspirin: Secondary | ICD-10-CM | POA: Insufficient documentation

## 2015-05-06 DIAGNOSIS — I5022 Chronic systolic (congestive) heart failure: Secondary | ICD-10-CM

## 2015-05-06 DIAGNOSIS — Z79899 Other long term (current) drug therapy: Secondary | ICD-10-CM | POA: Diagnosis not present

## 2015-05-06 DIAGNOSIS — Z792 Long term (current) use of antibiotics: Secondary | ICD-10-CM | POA: Diagnosis not present

## 2015-05-06 DIAGNOSIS — I2510 Atherosclerotic heart disease of native coronary artery without angina pectoris: Secondary | ICD-10-CM | POA: Diagnosis not present

## 2015-05-06 DIAGNOSIS — I25118 Atherosclerotic heart disease of native coronary artery with other forms of angina pectoris: Secondary | ICD-10-CM | POA: Diagnosis not present

## 2015-05-06 DIAGNOSIS — K219 Gastro-esophageal reflux disease without esophagitis: Secondary | ICD-10-CM | POA: Diagnosis not present

## 2015-05-06 DIAGNOSIS — R61 Generalized hyperhidrosis: Secondary | ICD-10-CM | POA: Diagnosis not present

## 2015-05-06 DIAGNOSIS — Z8679 Personal history of other diseases of the circulatory system: Secondary | ICD-10-CM

## 2015-05-06 LAB — CBC
HCT: 45.2 % (ref 39.0–52.0)
Hemoglobin: 15.3 g/dL (ref 13.0–17.0)
MCH: 29.7 pg (ref 26.0–34.0)
MCHC: 33.8 g/dL (ref 30.0–36.0)
MCV: 87.8 fL (ref 78.0–100.0)
Platelets: 177 10*3/uL (ref 150–400)
RBC: 5.15 MIL/uL (ref 4.22–5.81)
RDW: 13.2 % (ref 11.5–15.5)
WBC: 5.9 10*3/uL (ref 4.0–10.5)

## 2015-05-06 LAB — TROPONIN I
Troponin I: <0.03 ng/mL (ref <0.031)
Troponin I: <0.03 ng/mL (ref <0.031)
Troponin I: <0.03 ng/mL (ref <0.031)
Troponin I: <0.03 ng/mL (ref <0.031)

## 2015-05-06 LAB — BASIC METABOLIC PANEL
Anion gap: 9 (ref 5–15)
BUN: 21 mg/dL — ABNORMAL HIGH (ref 6–20)
CO2: 24 mmol/L (ref 22–32)
Calcium: 9.4 mg/dL (ref 8.9–10.3)
Chloride: 103 mmol/L (ref 101–111)
Creatinine, Ser: 1.08 mg/dL (ref 0.61–1.24)
GFR calc Af Amer: >60 mL/min (ref >60)
GFR calc non Af Amer: >60 mL/min (ref >60)
Glucose, Bld: 100 mg/dL — ABNORMAL HIGH (ref 65–99)
Potassium: 3.7 mmol/L (ref 3.5–5.1)
Sodium: 136 mmol/L (ref 135–145)

## 2015-05-06 MED ORDER — ACETAMINOPHEN 325 MG PO TABS: 650.0000 mg | ORAL_TABLET | ORAL | Status: DC | PRN

## 2015-05-06 MED ORDER — HEPARIN SODIUM (PORCINE) 5000 UNIT/ML IJ SOLN
5000.0000 [IU] | Freq: Three times a day (TID) | INTRAMUSCULAR | Status: DC
  Administered 2015-05-06 – 2015-05-07 (×2): 5000 [IU] via SUBCUTANEOUS
  Filled 2015-05-06 (×2): qty 1

## 2015-05-06 MED ORDER — ONDANSETRON HCL 4 MG PO TABS: 4.0000 mg | ORAL_TABLET | Freq: Four times a day (QID) | ORAL | Status: DC | PRN

## 2015-05-06 MED ORDER — SODIUM CHLORIDE 0.9 % IV SOLN: 250.0000 mL | INTRAVENOUS | Status: DC | PRN

## 2015-05-06 MED ORDER — ASPIRIN 81 MG PO CHEW
324.0000 mg | CHEWABLE_TABLET | Freq: Once | ORAL | Status: AC
  Administered 2015-05-06: 243 mg via ORAL
  Filled 2015-05-06: qty 4

## 2015-05-06 MED ORDER — ONDANSETRON HCL 4 MG/2ML IJ SOLN: 4.0000 mg | Freq: Four times a day (QID) | INTRAMUSCULAR | Status: DC | PRN

## 2015-05-06 MED ORDER — SODIUM CHLORIDE 0.9 % IJ SOLN
3.0000 mL | Freq: Two times a day (BID) | INTRAMUSCULAR | Status: DC
Start: 2015-05-06 — End: 2015-05-07
  Administered 2015-05-07: 3 mL via INTRAVENOUS

## 2015-05-06 MED ORDER — SPIRONOLACTONE 25 MG PO TABS
25.0000 mg | ORAL_TABLET | Freq: Every day | ORAL | Status: DC
  Administered 2015-05-06 – 2015-05-07 (×2): 25 mg via ORAL
  Filled 2015-05-06 (×2): qty 1

## 2015-05-06 MED ORDER — GI COCKTAIL ~~LOC~~: 30.0000 mL | Freq: Four times a day (QID) | ORAL | Status: DC | PRN

## 2015-05-06 MED ORDER — ACETAMINOPHEN 650 MG RE SUPP: 650.0000 mg | Freq: Four times a day (QID) | RECTAL | Status: DC | PRN

## 2015-05-06 MED ORDER — ASPIRIN EC 81 MG PO TBEC
81.0000 mg | DELAYED_RELEASE_TABLET | Freq: Every day | ORAL | Status: DC
  Administered 2015-05-07: 81 mg via ORAL
  Filled 2015-05-06: qty 1

## 2015-05-06 MED ORDER — SODIUM CHLORIDE 0.9 % IJ SOLN: 3.0000 mL | INTRAMUSCULAR | Status: DC | PRN

## 2015-05-06 MED ORDER — LOSARTAN POTASSIUM 50 MG PO TABS
25.0000 mg | ORAL_TABLET | Freq: Every day | ORAL | Status: DC
  Administered 2015-05-06 – 2015-05-07 (×2): 25 mg via ORAL
  Filled 2015-05-06 (×2): qty 1

## 2015-05-06 MED ORDER — CARVEDILOL 3.125 MG PO TABS
3.1250 mg | ORAL_TABLET | Freq: Two times a day (BID) | ORAL | Status: DC
  Administered 2015-05-06 – 2015-05-07 (×2): 3.125 mg via ORAL
  Filled 2015-05-06 (×2): qty 1

## 2015-05-06 MED ORDER — PANTOPRAZOLE SODIUM 40 MG PO TBEC
40.0000 mg | DELAYED_RELEASE_TABLET | Freq: Every day | ORAL | Status: DC
Start: 2015-05-06 — End: 2015-05-07
  Administered 2015-05-06 – 2015-05-07 (×2): 40 mg via ORAL
  Filled 2015-05-06 (×2): qty 1

## 2015-05-06 MED ORDER — NITROGLYCERIN 0.4 MG SL SUBL: 0.4000 mg | SUBLINGUAL_TABLET | SUBLINGUAL | Status: DC | PRN

## 2015-05-06 MED ORDER — MORPHINE SULFATE 2 MG/ML IJ SOLN: 2.0000 mg | INTRAMUSCULAR | Status: DC | PRN

## 2015-05-06 MED ORDER — SODIUM CHLORIDE 0.9 % IJ SOLN
3.0000 mL | Freq: Two times a day (BID) | INTRAMUSCULAR | Status: DC
  Administered 2015-05-06: 3 mL via INTRAVENOUS

## 2015-05-06 MED ORDER — ACETAMINOPHEN 325 MG PO TABS
650.0000 mg | ORAL_TABLET | Freq: Four times a day (QID) | ORAL | Status: DC | PRN
  Administered 2015-05-06: 650 mg via ORAL
  Filled 2015-05-06: qty 2

## 2015-05-06 MED ORDER — NITROGLYCERIN 2 % TD OINT
1.0000 [in_us] | TOPICAL_OINTMENT | Freq: Four times a day (QID) | TRANSDERMAL | Status: DC
  Administered 2015-05-06 – 2015-05-07 (×4): 1 [in_us] via TOPICAL
  Filled 2015-05-06 (×4): qty 1

## 2015-05-06 MED ORDER — MORPHINE SULFATE 4 MG/ML IJ SOLN
4.0000 mg | Freq: Once | INTRAMUSCULAR | Status: AC
  Administered 2015-05-06: 4 mg via INTRAVENOUS
  Filled 2015-05-06: qty 1

## 2015-05-06 NOTE — ED Notes (Addendum)
Pt co lt chest pain, started at 0900, cardiac pt with 20% of heart effective per pt. Pt appears diaphoretic with SOB on exertion. Pt does have a blockage without intervention.

## 2015-05-06 NOTE — H&P (Signed)
[facility] History and Physical  David Dougherty YTK:[id number] DOB: [birth year] DOA: 05/06/2015  Referring physician: Dr Tomi Bamberger - [facility] PCP: Sallee Lange, MD   Chief Complaint: CP  HPI: David Dougherty is a 64 y.o. male  Walking in house and developed sudden onset CP. TUMS w/o benefit (pt states this typically works for gas pain). Constant but worse when lets out a deep breath. Associated w/ diaphoresis and radiates to L arm. Improves w/ rest and morphine.  Denies nausea, palpitations, SOB.    Review of Systems:  Constitutional:  No weight loss, night sweats, Fevers, chills, fatigue.  HEENT:  No headaches, Difficulty swallowing,Tooth/dental problems,Sore throat,  No sneezing, itching, ear ache, nasal congestion, post nasal drip,  Cardio-vascular: Per HPI GI:  No heartburn, indigestion, abdominal pain, nausea, vomiting, diarrhea, change in bowel habits, loss of appetite  Resp:   No shortness of breath with exertion or at rest. No excess mucus, no productive cough, No non-productive cough, No coughing up of blood.No change in color of mucus.No wheezing.No chest wall deformity  Skin:  no rash or lesions.  GU:  no dysuria, change in color of urine, no urgency or frequency. No flank pain.  Musculoskeletal:   No joint pain or swelling. No decreased range of motion. No back pain.  Psych:  No change in mood or affect. No depression or anxiety. No memory loss.   Past Medical History  Diagnosis Date  . GERD (gastroesophageal reflux disease)   . Tobacco abuse     Snuff  . Atrial fibrillation   . Nonischemic cardiomyopathy   . CAD (coronary artery disease)     70% distal circumflex 11/2014   . Systolic CHF    Past Surgical History  Procedure Laterality Date  . Appendectomy    . Tonsillectomy    . Colonoscopy  2005    Neg  . Left and right heart catheterization with coronary angiogram N/A 12/03/2014    Procedure: LEFT AND RIGHT HEART CATHETERIZATION WITH CORONARY  ANGIOGRAM;  Surgeon: Blane Ohara, MD;  Location: [facility] CATH LAB;  Service: Cardiovascular;  Laterality: N/A;  . Colonoscopy N/A 01/05/2015    SLF:17 colon polyps removed/moderate sized hemorrhoids  . 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 Hemorrhoid banding N/A 01/05/2015    Procedure: HEMORRHOID BANDING;  Surgeon: Danie Binder, MD;  Location: [facility];  Service: Endoscopy;  Laterality: N/A;   Social History:  reports that he quit smoking about 42 years ago. His smoking use included Cigars and Cigarettes. He started smoking about 45 years ago. He has a .75 pack-year smoking history. His smokeless tobacco use includes Snuff. He reports that he does not drink alcohol or use illicit drugs.  No Known Allergies  Family History  Problem Relation Age of Onset  . Heart attack Father   . Hypertension Father   . Diabetes Father   . Colon cancer Neg Hx   . Colon polyps Neg Hx      Prior to Admission medications   Medication Sig Start Date End Date Taking? Authorizing Provider  aspirin EC 81 MG tablet Take 81 mg by mouth daily.   Yes Historical Provider, MD  calcium carbonate (TUMS - DOSED IN MG ELEMENTAL CALCIUM) 500 MG chewable tablet Chew 1 tablet by mouth as needed for indigestion or heartburn.   Yes Historical Provider, MD  carvedilol (COREG) 3.125 MG tablet Take 1 tablet (3.125 mg total)  by mouth 2 (two) times daily. 11/21/14  Yes Satira Sark, MD  acetaminophen (TYLENOL) 500 MG tablet Take 500 mg by mouth every 6 (six) hours as needed (for pain).     Historical Provider, MD  losartan (COZAAR) 25 MG tablet Take 1 tablet (25 mg total) by mouth daily. 12/22/14   Satira Sark, MD  omeprazole (PRILOSEC) 20 MG capsule 1 po 30 mins prior to breakfast Patient taking differently: Take 20 mg by mouth daily as needed (acid reflux).  01/05/15   Danie Binder, MD  spironolactone  (ALDACTONE) 25 MG tablet Take 1 tablet (25 mg total) by mouth daily. 02/26/15   Satira Sark, MD  sulfamethoxazole-trimethoprim (BACTRIM DS,SEPTRA DS) 800-160 MG per tablet Take 1 tablet by mouth 2 (two) times daily. Patient not taking: Reported on 05/06/2015 04/10/15   Kathyrn Drown, MD   Physical Exam: Filed Vitals:   05/06/15 1515 05/06/15 1620 05/06/15 1644 05/06/15 1729  BP:  112/85 112/85 134/99  Pulse: 66 67 72 58  Temp:   97.9 F (36.6 C) 97.9 F (36.6 C)  TempSrc:   Oral Oral  Resp: 14 11 12 16   Height:    6\' 1"  (1.854 m)  Weight:    92.352 kg (203 lb 9.6 oz)  SpO2: 98% 98% 98% 99%    Wt Readings from Last 3 Encounters:  05/06/15 92.352 kg (203 lb 9.6 oz)  04/10/15 95.255 kg (210 lb)  02/26/15 94.802 kg (209 lb)    General:  Appears calm and comfortable Eyes:  PERRL, normal lids, irises & conjunctiva ENT:  grossly normal hearing, lips & tongue Neck:  no LAD, masses or thyromegaly Cardiovascular:  RRR, II/VI systolic Murmur, trace lower cavity edema bilaterally. Telemetry: AFIb Respiratory:  CTA bilaterally, no w/r/r. Normal respiratory effort. Abdomen:  soft, ntnd Skin:  no rash or induration seen on limited exam Musculoskeletal:  grossly normal tone BUE/BLE, a nonreproducible on palpation of chest wall. Psychiatric:  grossly normal mood and affect, speech fluent and appropriate Neurologic:  grossly non-focal.          Labs on Admission:  Basic Metabolic Panel:  Recent Labs Lab 05/06/15 1315  NA 136  K 3.7  CL 103  CO2 24  GLUCOSE 100*  BUN 21*  CREATININE 1.08  CALCIUM 9.4   Liver Function Tests: No results for input(s): AST, ALT, ALKPHOS, BILITOT, PROT, ALBUMIN in the last 168 hours. No results for input(s): LIPASE, AMYLASE in the last 168 hours. No results for input(s): AMMONIA in the last 168 hours. CBC:  Recent Labs Lab 05/06/15 1315  WBC 5.9  HGB 15.3  HCT 45.2  MCV 87.8  PLT 177   Cardiac Enzymes:  Recent Labs Lab  05/06/15 1315  TROPONINI <0.03    BNP (last 3 results) No results for input(s): BNP in the last 8760 hours.  ProBNP (last 3 results) No results for input(s): PROBNP in the last 8760 hours.  CBG: No results for input(s): GLUCAP in the last 168 hours.  Radiological Exams on Admission: Dg Chest Port 1 View  05/06/2015   CLINICAL DATA:  Began having LEFT side chest pain at 0000 hours this morning, shortness of breath, diaphoresis, history atrial fibrillation, coronary artery disease, non ischemic cardiomyopathy, smoking  EXAM: PORTABLE CHEST - 1 VIEW  COMPARISON:  Portable exam 1330 hours compared to 11/21/2014  FINDINGS: Enlargement of cardiac silhouette.  Mediastinal contours and pulmonary vascularity normal.  Lungs clear.  No pleural effusion or pneumothorax.  Bones unremarkable.  IMPRESSION: Enlargement of cardiac silhouette without acute infiltrate.   Electronically Signed   By: Lavonia Dana M.D.   On: 05/06/2015 14:00    EKG: Independently reviewed. Afib, No sign of ACS.   Assessment/Plan Principal Problem:   Chest pain Active Problems:   Atrial fibrillation   GASTROESOPHAGEAL REFLUX DISEASE   CAD (coronary artery disease), native coronary artery   H/O cardiomyopathy   Essential hypertension   Chronic systolic congestive heart failure   CP: Cardiac vs GI vs pleuritic vs MSK. H/o non-ischemic cardiomyopathy. Last Cath in 2015 showing 70% distal AV circumflex stenosis with the plan for medical management. Troponin neg. CXR w/o acute process. Improved after morphine and nitroglycerin patch in ED. - Tele obs - Cycle troponins - EKG in am - GI cocktail.  - ASA  HTN: normotensive - continue losartan, coreg,   Chronic Afib: Patient followed by Dr. Domenic Polite. As of last office visit on 02/26/2015 was decided the patient would not as of yet begin anticoagulation therapy. Rate controlled. - Continue beta blocker  CHF: chronic systolic failure. No sign of acute failure. Last Echo  03/12/2015 EF 20-25%.  - continue spironolactone, bblocker and ARB  GERD: h/o Barrett's - continue PPI   Code Status: FULL DVT Prophylaxis: Hep Family Communication: WIfe and sister Disposition Plan: pending improvement  Haislee Corso J, MD Family Medicine [facility] [email or web address] Password [email or web address]

## 2015-05-06 NOTE — ED Provider Notes (Signed)
CSN: [id number]     Arrival date & time 05/06/15  1303 History   First MD Initiated Contact with Patient 05/06/15 1349     Chief Complaint  Patient presents with  . Chest Pain   Patient is a 64 y.o. male presenting with chest pain. The history is provided by the patient.  Chest Pain Pain location:  L chest Pain quality: sharp   Pain radiates to:  L arm Pain severity:  Moderate Duration: a few seconds at a time for the last 6 hours. Timing:  Intermittent Chronicity:  New Relieved by:  Rest Exacerbated by: not by deep breathing or movement. Associated symptoms: diaphoresis and shortness of breath   Associated symptoms: no cough, no fever and no nausea   Risk factors: coronary artery disease (non obstructive) and smoking   Risk factors: no hypertension and no prior DVT/PE   Pt denies having trouble with chest pain in the past.  He had a cardiac cath in 2015 that showed 70% distal AV circumflex stenosis , plan for medical management.    Past Medical History  Diagnosis Date  . GERD (gastroesophageal reflux disease)   . Tobacco abuse     Snuff  . Atrial fibrillation   . Nonischemic cardiomyopathy   . CAD (coronary artery disease)     70% distal circumflex 11/2014    Past Surgical History  Procedure Laterality Date  . Appendectomy    . Tonsillectomy    . Colonoscopy  2005    Neg  . Left and right heart catheterization with coronary angiogram N/A 12/03/2014    Procedure: LEFT AND RIGHT HEART CATHETERIZATION WITH CORONARY ANGIOGRAM;  Surgeon: Blane Ohara, MD;  Location: [facility] CATH LAB;  Service: Cardiovascular;  Laterality: N/A;  . Colonoscopy N/A 01/05/2015    SLF:17 colon polyps removed/moderate sized hemorrhoids  . 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 Hemorrhoid banding N/A 01/05/2015    Procedure: HEMORRHOID BANDING;  Surgeon: Danie Binder, MD;  Location: [facility];  Service: Endoscopy;  Laterality: N/A;   Family History  Problem Relation Age of Onset  . Heart attack Father   . Hypertension Father   . Diabetes Father   . Colon cancer Neg Hx   . Colon polyps Neg Hx    History  Substance Use Topics  . Smoking status: Former Smoker -- 0.25 packs/day for 3 years    Types: 49, Cigarettes    Start date: 10/19/1969    Quit date: 10/22/1972  . Smokeless tobacco: Current User    Types: Snuff  . Alcohol Use: No    Review of Systems  Constitutional: Positive for diaphoresis. Negative for fever.  Respiratory: Positive for shortness of breath. Negative for cough.   Cardiovascular: Positive for chest pain.  Gastrointestinal: Negative for nausea.  All other systems reviewed and are negative.     Allergies  Review of patient's allergies indicates no known allergies.  Home Medications   Prior to Admission medications   Medication Sig Start Date End Date Taking? Authorizing Provider  acetaminophen (TYLENOL) 500 MG tablet Take 500 mg by mouth every 6 (six) hours as needed (for pain).     Historical Provider, MD  aspirin 81 MG tablet Take 81 mg by mouth daily.    Historical Provider, MD  calcium carbonate (TUMS - DOSED IN MG ELEMENTAL CALCIUM) 500 MG chewable tablet Chew 1 tablet by mouth as needed for indigestion  or heartburn.    Historical Provider, MD  carvedilol (COREG) 3.125 MG tablet Take 1 tablet (3.125 mg total) by mouth 2 (two) times daily. 11/21/14   Satira Sark, MD  losartan (COZAAR) 25 MG tablet Take 1 tablet (25 mg total) by mouth daily. 12/22/14   Satira Sark, MD  omeprazole (PRILOSEC) 20 MG capsule 1 po 30 mins prior to breakfast 01/05/15   Danie Binder, MD  spironolactone (ALDACTONE) 25 MG tablet Take 1 tablet (25 mg total) by mouth daily. 02/26/15   Satira Sark, MD  sulfamethoxazole-trimethoprim (BACTRIM DS,SEPTRA DS) 800-160 MG per tablet Take 1 tablet by mouth 2 (two) times daily. 04/10/15   Kathyrn Drown, MD    BP 121/83 mmHg  Pulse 76  Temp(Src) 97.9 F (36.6 C) (Oral)  Resp 15  Ht 6\' 1"  (1.854 m)  Wt 205 lb (92.987 kg)  BMI 27.05 kg/m2  SpO2 96% Physical Exam  Constitutional: He appears well-developed and well-nourished. No distress.  HENT:  Head: Normocephalic and atraumatic.  Right Ear: External ear normal.  Left Ear: External ear normal.  Eyes: Conjunctivae are normal. Right eye exhibits no discharge. Left eye exhibits no discharge. No scleral icterus.  Neck: Neck supple. No tracheal deviation present.  Cardiovascular: Normal rate and intact distal pulses.  An irregularly irregular rhythm present.  Pulmonary/Chest: Effort normal and breath sounds normal. No stridor. No respiratory distress. He has no wheezes. He has no rales.  Abdominal: Soft. Bowel sounds are normal. He exhibits no distension. There is no tenderness. There is no rebound and no guarding.  Musculoskeletal: He exhibits no edema or tenderness.  Neurological: He is alert. He has normal strength. No cranial nerve deficit (no facial droop, extraocular movements intact, no slurred speech) or sensory deficit. He exhibits normal muscle tone. He displays no seizure activity. Coordination normal.  Skin: Skin is warm and dry. No rash noted.  Psychiatric: He has a normal mood and affect.  Nursing note and vitals reviewed.   ED Course  Procedures (including critical care time) Labs Review Labs Reviewed  BASIC METABOLIC PANEL - Abnormal; Notable for the following:    Glucose, Bld 100 (*)    BUN 21 (*)    All other components within normal limits  CBC  TROPONIN I    Imaging Review Dg Chest Port 1 View  05/06/2015   CLINICAL DATA:  Began having LEFT side chest pain at 0000 hours this morning, shortness of breath, diaphoresis, history atrial fibrillation, coronary artery disease, non ischemic cardiomyopathy, smoking  EXAM: PORTABLE CHEST - 1 VIEW  COMPARISON:  Portable exam 1330 hours compared to 11/21/2014  FINDINGS:  Enlargement of cardiac silhouette.  Mediastinal contours and pulmonary vascularity normal.  Lungs clear.  No pleural effusion or pneumothorax.  Bones unremarkable.  IMPRESSION: Enlargement of cardiac silhouette without acute infiltrate.   Electronically Signed   By: Lavonia Dana M.D.   On: 05/06/2015 14:00     EKG Interpretation   Date/Time:  Wednesday May 06 2015 13:09:01 EDT Ventricular Rate:  83 PR Interval:    QRS Duration: 99 QT Interval:  358 QTC Calculation: 421 R Axis:   -5 Text Interpretation:  Atrial fibrillation Borderline T abnormalities,  inferior leads No old tracing to compare Confirmed by Kaye Luoma  MD-J, Chauncey Sciulli  ([id number]) on 05/06/2015 1:39:27 PM     Medications  nitroGLYCERIN (NITROSTAT) SL tablet 0.4 mg (not administered)  aspirin chewable tablet 324 mg (not administered)  nitroGLYCERIN (NITROGLYN) 2 % ointment  1 inch (1 inch Topical Given 05/06/15 1419)  morphine 4 MG/ML injection 4 mg (4 mg Intravenous Given 05/06/15 1414)    MDM   Final diagnoses:  Chest pain, unspecified chest pain type   Pt is now in the ED with chest pain associated with diaphoresis and dyspnea.  Initial troponin negative.  Sx concerning for angina.  Will consult with medical service for serial enzymes, admission.     Dorie Rank, MD 05/06/15 [phone]

## 2015-05-07 ENCOUNTER — Ambulatory Visit: Payer: BLUE CROSS/BLUE SHIELD | Admitting: Cardiology

## 2015-05-07 DIAGNOSIS — I519 Heart disease, unspecified: Secondary | ICD-10-CM

## 2015-05-07 DIAGNOSIS — I10 Essential (primary) hypertension: Secondary | ICD-10-CM

## 2015-05-07 DIAGNOSIS — Z7189 Other specified counseling: Secondary | ICD-10-CM

## 2015-05-07 DIAGNOSIS — I4891 Unspecified atrial fibrillation: Secondary | ICD-10-CM

## 2015-05-07 DIAGNOSIS — I429 Cardiomyopathy, unspecified: Secondary | ICD-10-CM

## 2015-05-07 DIAGNOSIS — I5022 Chronic systolic (congestive) heart failure: Secondary | ICD-10-CM | POA: Diagnosis not present

## 2015-05-07 DIAGNOSIS — R079 Chest pain, unspecified: Secondary | ICD-10-CM

## 2015-05-07 DIAGNOSIS — I2510 Atherosclerotic heart disease of native coronary artery without angina pectoris: Secondary | ICD-10-CM | POA: Diagnosis not present

## 2015-05-07 DIAGNOSIS — I482 Chronic atrial fibrillation: Secondary | ICD-10-CM

## 2015-05-07 DIAGNOSIS — I25118 Atherosclerotic heart disease of native coronary artery with other forms of angina pectoris: Secondary | ICD-10-CM | POA: Diagnosis not present

## 2015-05-07 DIAGNOSIS — R0602 Shortness of breath: Secondary | ICD-10-CM | POA: Diagnosis not present

## 2015-05-07 DIAGNOSIS — R61 Generalized hyperhidrosis: Secondary | ICD-10-CM | POA: Diagnosis not present

## 2015-05-07 LAB — BASIC METABOLIC PANEL
Anion gap: 6 (ref 5–15)
BUN: 15 mg/dL (ref 6–20)
CO2: 26 mmol/L (ref 22–32)
Calcium: 8.8 mg/dL — ABNORMAL LOW (ref 8.9–10.3)
Chloride: 106 mmol/L (ref 101–111)
Creatinine, Ser: 0.88 mg/dL (ref 0.61–1.24)
GFR calc Af Amer: >60 mL/min (ref >60)
GFR calc non Af Amer: >60 mL/min (ref >60)
Glucose, Bld: 113 mg/dL — ABNORMAL HIGH (ref 65–99)
Potassium: 4 mmol/L (ref 3.5–5.1)
Sodium: 138 mmol/L (ref 135–145)

## 2015-05-07 LAB — CBC
HCT: 43.5 % (ref 39.0–52.0)
Hemoglobin: 14.1 g/dL (ref 13.0–17.0)
MCH: 28.8 pg (ref 26.0–34.0)
MCHC: 32.4 g/dL (ref 30.0–36.0)
MCV: 88.8 fL (ref 78.0–100.0)
Platelets: 169 10*3/uL (ref 150–400)
RBC: 4.9 MIL/uL (ref 4.22–5.81)
RDW: 13.2 % (ref 11.5–15.5)
WBC: 5.2 10*3/uL (ref 4.0–10.5)

## 2015-05-07 LAB — MAGNESIUM: Magnesium: 2 mg/dL (ref 1.7–2.4)

## 2015-05-07 MED ORDER — OMEPRAZOLE 20 MG PO CPDR: 20.0000 mg | DELAYED_RELEASE_CAPSULE | Freq: Every day | ORAL | Status: DC | PRN

## 2015-05-07 MED ORDER — LOSARTAN POTASSIUM 25 MG PO TABS: 25.0000 mg | ORAL_TABLET | Freq: Every day | ORAL | Status: DC

## 2015-05-07 MED ORDER — RIVAROXABAN 20 MG PO TABS: 20.0000 mg | ORAL_TABLET | Freq: Every day | ORAL | Status: DC

## 2015-05-07 MED ORDER — CARVEDILOL 3.125 MG PO TABS: 3.1250 mg | ORAL_TABLET | Freq: Two times a day (BID) | ORAL | Status: DC

## 2015-05-07 MED ORDER — SPIRONOLACTONE 25 MG PO TABS: 25.0000 mg | ORAL_TABLET | Freq: Every day | ORAL | Status: DC

## 2015-05-07 MED ORDER — HEPARIN SODIUM (PORCINE) 5000 UNIT/ML IJ SOLN
5000.0000 [IU] | Freq: Three times a day (TID) | INTRAMUSCULAR | Status: DC
  Administered 2015-05-07: 5000 [IU] via SUBCUTANEOUS
  Filled 2015-05-07: qty 1

## 2015-05-07 MED ORDER — NITROGLYCERIN 0.4 MG SL SUBL: 0.4000 mg | SUBLINGUAL_TABLET | SUBLINGUAL | Status: DC | PRN

## 2015-05-07 NOTE — Progress Notes (Signed)
David Dougherty discharged home with wife per MD order.  Discharge instructions reviewed and discussed with the patient and wife at bedside, all questions and concerns answered. Copy of instructions and scripts given to patient's wife.    Medication List    STOP taking these medications        aspirin EC 81 MG tablet     sulfamethoxazole-trimethoprim 800-160 MG per tablet  Commonly known as:  BACTRIM DS,SEPTRA DS      TAKE these medications        acetaminophen 500 MG tablet  Commonly known as:  TYLENOL  Take 500 mg by mouth every 6 (six) hours as needed (for pain).     calcium carbonate 500 MG chewable tablet  Commonly known as:  TUMS - dosed in mg elemental calcium  Chew 1 tablet by mouth as needed for indigestion or heartburn.     carvedilol 3.125 MG tablet  Commonly known as:  COREG  Take 1 tablet (3.125 mg total) by mouth 2 (two) times daily.     losartan 25 MG tablet  Commonly known as:  COZAAR  Take 1 tablet (25 mg total) by mouth daily.     nitroGLYCERIN 0.4 MG SL tablet  Commonly known as:  NITROSTAT  Place 1 tablet (0.4 mg total) under the tongue every 5 (five) minutes as needed for chest pain.     omeprazole 20 MG capsule  Commonly known as:  PRILOSEC  Take 1 capsule (20 mg total) by mouth daily as needed (acid reflux).     rivaroxaban 20 MG Tabs tablet  Commonly known as:  XARELTO  Take 1 tablet (20 mg total) by mouth daily.  Start taking on:  05/08/2015     spironolactone 25 MG tablet  Commonly known as:  ALDACTONE  Take 1 tablet (25 mg total) by mouth daily.        Patients skin is clean, dry and intact, no evidence of skin break down. IV site discontinued and catheter remains intact. Site without signs and symptoms of complications. Dressing and pressure applied.  Patient escorted to car by Horris Latino, RN in a wheelchair,  no distress noted upon discharge.  Regino Bellow 05/07/2015 4:10 PM

## 2015-05-07 NOTE — Consult Note (Signed)
CARDIOLOGY CONSULT NOTE   Patient ID: David Dougherty MRN: [id number] DOB/AGE: 05/10/1951 64 y.o.  Admit Date: 05/06/2015 Referring Physician: PTH Primary Physician: Sallee Lange, MD Consulting Cardiologist: Kate Sable MD Primary Cardiologist: Rozann Lesches MD Reason for Consultation: Chest Pain with know CAD  Clinical Summary David Dougherty is a 64 y.o.male with known history of CAD, NICM, atrial fibrillation CHADS VASC  Score 1 no anticoagulation at this time, and systolic CHF, most recentl cardia cath on 12/03/2014 with severe steonsis of the 1st OM branch of the left circumflex, treated medically, who presented to ER with sudden onset of chest pain, constant with radiation to lett arm. He states he occasionally has "heart burn pain" which is relieved with Tums, but the pain he felt was pressure located at the left chest, pointing to an area near the apex of the heart. Lasted from 8:30 in the am to 1pm when he got to ER. He states he felt diaphoretic, and pain worsened with deep inspiration.    Pain was relieved in ER with NTG and morphine, given ASA 324 mg and NTG paste was placed.. BP on arrival, 132/89, HR 80, O2 sat 98%. Potassium 3.7, Creainine 1.08.CXR demonstrated enlargement of the cardiac silhouette without CHF or edema. EKG demonstrated atrial fib without acute ST-T wave changes. Troponin has been negative X 3.    He states the has helped a friend push a car into a garage earlier that morning, At the time, he states he did not have chest pain or symptoms. He wife also states that he had significant cramping in his legs last week. "I had to beat his legs to make them stop." He took mustard and the symptoms were relieved. He states he had some cramping last evening while in the hospital but they were not severe. He has had no recurrence of chest pain, but feels like it might come back.  No Known Allergies  Medications Scheduled Medications: . aspirin EC  81 mg Oral  Daily  . carvedilol  3.125 mg Oral BID  . heparin  5,000 Units Subcutaneous 3 times per day  . losartan  25 mg Oral Daily  . nitroGLYCERIN  1 inch Topical 4 times per day  . pantoprazole  40 mg Oral Daily  . sodium chloride  3 mL Intravenous Q12H  . sodium chloride  3 mL Intravenous Q12H  . spironolactone  25 mg Oral Daily       PRN Medications: sodium chloride, acetaminophen **OR** acetaminophen, acetaminophen, gi cocktail, morphine injection, nitroGLYCERIN, ondansetron **OR** ondansetron (ZOFRAN) IV, sodium chloride   Past Medical History  Diagnosis Date  . GERD (gastroesophageal reflux disease)   . Tobacco abuse     Snuff  . Atrial fibrillation   . Nonischemic cardiomyopathy   . CAD (coronary artery disease)     70% distal circumflex 11/2014   . Systolic CHF     Past Surgical History  Procedure Laterality Date  . Appendectomy    . Tonsillectomy    . Colonoscopy  2005    Neg  . Left and right heart catheterization with coronary angiogram N/A 12/03/2014    Procedure: LEFT AND RIGHT HEART CATHETERIZATION WITH CORONARY ANGIOGRAM;  Surgeon: Blane Ohara, MD;  Location: [facility] CATH LAB;  Service: Cardiovascular;  Laterality: N/A;  . Colonoscopy N/A 01/05/2015    SLF:17 colon polyps removed/moderate sized hemorrhoids  . 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  Hemorrhoid banding N/A 01/05/2015    Procedure: HEMORRHOID BANDING;  Surgeon: Danie Binder, MD;  Location: [facility];  Service: Endoscopy;  Laterality: N/A;    Family History  Problem Relation Age of Onset  . Heart attack Father   . Hypertension Father   . Diabetes Father   . Colon cancer Neg Hx   . Colon polyps Neg Hx     Social History David Dougherty reports that he quit smoking about 42 years ago. His smoking use included Cigars and Cigarettes. He started smoking about 45 years ago. He has a .75  pack-year smoking history. His smokeless tobacco use includes Snuff. David Dougherty reports that he does not drink alcohol.  Review of Systems Complete review of systems are found to be negative unless outlined in H&P above.  Physical Examination Blood pressure 103/64, pulse 60, temperature 98 F (36.7 C), temperature source Oral, resp. rate 18, height '6\' 1"'  (1.854 m), weight 203 lb 9.6 oz (92.352 kg), SpO2 96 %.  Intake/Output Summary (Last 24 hours) at 05/07/15 1000 Last data filed at 05/07/15 0656  Gross per 24 hour  Intake    720 ml  Output    800 ml  Net    -80 ml    Telemetry: Atrial fib rates in th  70's   GEN: Resting comfortably.  HEENT: Conjunctiva and lids normal, oropharynx clear with moist mucosa. Neck: Supple, no elevated JVP or carotid bruits, no thyromegaly. Lungs: Clear to auscultation, nonlabored breathing at rest. Cardiac: Iregular rate and rhythm, no S3 or significant systolic murmur, no pericardial rub. Abdomen: Soft, nontender, no hepatomegaly, bowel sounds present, no guarding or rebound. Extremities: No pitting edema, distal pulses 2+. Skin: Warm and dry. Musculoskeletal: No kyphosis. Pain is not reproducible with palpation or deep breaths.  Neuropsychiatric: Alert and oriented x3, affect grossly appropriate.  Prior Cardiac Testing/Procedures 1.Cardiac Cath 12/03/2014 Final Conclusions:  1. Severe nonischemic cardiomyopathy with LVEF less than 30% 2. Preserved intracardiac hemodynamics with preserved cardiac output 3. Single-vessel coronary artery disease with moderately severe stenosis of the first obtuse marginal branch of the left circumflex  Recommendations: Medical therapy for cardiomyopathy. I think the patient's coronary artery disease finding is likely incidental and unrelated to his cardiomyopathic process. This lesion would be amenable to PCI if he develops symptoms of angina or any other indication for revascularization.  Lab Results  Basic  Metabolic Panel:  Recent Labs Lab 05/06/15 1315 05/07/15 0645  NA 136 138  K 3.7 4.0  CL 103 106  CO2 24 26  GLUCOSE 100* 113*  BUN 21* 15  CREATININE 1.08 0.88  CALCIUM 9.4 8.8*    CBC:  Recent Labs Lab 05/06/15 1315 05/07/15 0645  WBC 5.9 5.2  HGB 15.3 14.1  HCT 45.2 43.5  MCV 87.8 88.8  PLT 177 169    Cardiac Enzymes:  Recent Labs Lab 05/06/15 1315 05/06/15 1803 05/06/15 2009 05/06/15 2306  TROPONINI <0.03 <0.03 <0.03 <0.03    Radiology: Dg Chest Port 1 View  05/06/2015   CLINICAL DATA:  Began having LEFT side chest pain at 0000 hours this morning, shortness of breath, diaphoresis, history atrial fibrillation, coronary artery disease, non ischemic cardiomyopathy, smoking  EXAM: PORTABLE CHEST - 1 VIEW  COMPARISON:  Portable exam 1330 hours compared to 11/21/2014  FINDINGS: Enlargement of cardiac silhouette.  Mediastinal contours and pulmonary vascularity normal.  Lungs clear.  No pleural effusion or pneumothorax.  Bones unremarkable.  IMPRESSION: Enlargement of cardiac silhouette without acute infiltrate.  Electronically Signed   By: Lavonia Dana M.D.   On: 05/06/2015 14:00     ECG: Atrial fib rate of 64 bpm with nonspecific T wave abnormalities in the inferior leads.    Impression and Recommendations  1.Chest Pain: Typical and atypical features occuring while walking. Did strenuous activity by helping a friend push his car into a garage earlier that morning. Pain is localized to the left chest in a quarter-sized area, with radiation to the left arm and diaphoreses. Lasted approx 4 1/2 hours, was constant and not relieved with Tums, but was relieved with NTG and morphine.  Troponin is negative X 3. Suspect musculoskeletal etiology, but with know CAD, and severe stenosis of the 1st OM, may consider restudy. Will discuss with Dr. Bronson Ing before proceeding with further testing.   2.CAD: Most recent cath on 12/03/2014 demonstrated severe stenosis of the lst OM  branch of the left circumflex. He was recommended to be treated medically, but could have intervention if he became symptomatic. Currently he is pain free, remains on carvedilol, ASA, ARB, but do not see statin therapy, ruling out myalgia's. Most recent lipid study was in 12/10 2015. TC: 179, LDL 124, HDL 41.   3. NICM: Cardiac cath demonstrated an EF of <30 % in 11/2014. He is on ARB, and spironolactone, without diuresis. He denies rapid HR or frequent palpitations. May need to consider repeating echo since he has been on appropriate therapy for 5 months.   4. Atrial fib: His heart rate is controlled. CHADS VASC Score of 2,  (CAD, LV dysfunction) Not on anticoagulation currently. Consider adding Eliquis 5 mg BID.   5. Leg Cramps: On PPI. Will check Mg+ level.   Signed: Phill Myron. Lawrence NP [place]  05/07/2015, 10:00 AM Co-Sign MD  The patient was seen and examined, and I agree with the assessment and plan as documented above, with modifications as noted below.  Pt with largely single vessel CAD in OM1 branch, chronic atrial fibrillation, nonischemic cardiomyopathy (EF 20-25% in 03/2015) admitted with chest pain. Pain was relieved with exhalation and made worse by sitting down, as he describes a "pressure-like sensation as if stomach pushing up on chest while sitting". Pain not made worse with exertion.  ECG shows atrial fibrillation with nonspecific T wave abnormality. Troponins normal.  Chest pain seems atypical for a cardiac etiology. That being said, he did experience relief with morphine and SL nitroglycerin.  I would discharge him on SL nitroglycerin prn. If he had to take it frequently, I would consider isosorbide mononitrate at that time.  Given CAD and reduced LVEF, I think anticoagulation would be warranted to reduce thromboembolic risk. CHADSVASC score is 2 (will be 3 when he turns 64). He did have 17 hemorrhoids which were banded on 01/05/15 and has mild esophagitis and duodenitis  from GERD. I would stop ASA and start Xarelto 20 mg daily. Hgb is normal.  Finally, given severely reduced LVEF, he is a candidate for an ICD for the primary prevention of sudden cardiac death, as he has been on both a beta blocker and ARB for several months.  I will defer EP referral to Dr. Domenic Polite, his cardiologist whom he was to see in clinic today.

## 2015-05-07 NOTE — Discharge Summary (Signed)
Physician Discharge Summary  David Dougherty ION:[id number] DOB: 1951-08-27 DOA: 05/06/2015  PCP: Sallee Lange, MD  Admit date: 05/06/2015 Discharge date: 05/07/2015  Time spent: 40 minutes  Recommendations for Outpatient Follow-up:  1. Follow up with Jory Sims 05/12/15 for evaluation on candidacy for an ICD and possible EP referral.    Discharge Diagnoses:  Principal Problem:   Chest pain Active Problems:   Atrial fibrillation   GASTROESOPHAGEAL REFLUX DISEASE   CAD (coronary artery disease), native coronary artery   H/O cardiomyopathy   Essential hypertension   Chronic systolic congestive heart failure   Discharge Condition: stable  Diet recommendation: heart healthy  Filed Weights   05/06/15 1310 05/06/15 1729  Weight: 92.987 kg (205 lb) 92.352 kg (203 lb 9.6 oz)    History of present illness:  Presented to ED on 05/06/15 with cc chest pain.  patient reported walking in house and developing sudden onset CP. TUMS w/o benefit (pt states this typically works for gas pain). Described as constant but worse when lets out a deep breath. Associated w/ diaphoresis and radiated to L arm. Improved w/ rest and morphine. Denied nausea, palpitations, SOB.  Hospital Course:  CP: Cardiac vs GI vs pleuritic vs MSK. H/o non-ischemic cardiomyopathy. Last Cath in 2015 showing 70% distal AV circumflex stenosis with the plan for medical management. Troponin neg x3. CXR w/o acute process. Improved after morphine and nitroglycerin patch in ED. No events on tele. Has ruled out for ACS. Evaluated by cardiology who opine chest pain atypical for cardiac etiology but pain relieved by MS and SL nitro. Recommended discharge with SL nitro.   HTN: normotensive.   Chronic Afib: Patient followed by Dr. Domenic Polite. As of last office visit on 02/26/2015 was decided the patient would not as of yet begin anticoagulation therapy. Evaluated by cardiology who opine given CAD and reduced LVEF anticoagulation  warranted. Started on Xarelto. Has follow up appointment 05/12/15 with cards  CHF: chronic systolic failure. No sign of acute failure. Last Echo 03/12/2015 EF 20-25%.  GERD: h/o Barrett's. continue PPI  Procedures:  none  Consultations:  cardiology  Discharge Exam: Filed Vitals:   05/07/15 1424  BP: 120/85  Pulse: 67  Temp: 97.7 F (36.5 C)  Resp: 18    General: well nourished appears well Cardiovascular: irregularly irregular no MGR No LE edema Respiratory: normal effort BS clear bilaterally no wheeze  Discharge Instructions   Discharge Instructions    Diet - low sodium heart healthy    Complete by:  As directed      Discharge instructions    Complete by:  As directed   Take medications as directed.  Follow up with cardiology as scheduled     Increase activity slowly    Complete by:  As directed           Current Discharge Medication List    START taking these medications   Details  nitroGLYCERIN (NITROSTAT) 0.4 MG SL tablet Place 1 tablet (0.4 mg total) under the tongue every 5 (five) minutes as needed for chest pain. Qty: 30 tablet, Refills: 12    rivaroxaban (XARELTO) 20 MG TABS tablet Take 1 tablet (20 mg total) by mouth daily. Qty: 30 tablet, Refills: 1      CONTINUE these medications which have CHANGED   Details  carvedilol (COREG) 3.125 MG tablet Take 1 tablet (3.125 mg total) by mouth 2 (two) times daily. Refills: 0    losartan (COZAAR) 25 MG tablet Take 1 tablet (25 mg  total) by mouth daily.    omeprazole (PRILOSEC) 20 MG capsule Take 1 capsule (20 mg total) by mouth daily as needed (acid reflux).    spironolactone (ALDACTONE) 25 MG tablet Take 1 tablet (25 mg total) by mouth daily.      CONTINUE these medications which have NOT CHANGED   Details  calcium carbonate (TUMS - DOSED IN MG ELEMENTAL CALCIUM) 500 MG chewable tablet Chew 1 tablet by mouth as needed for indigestion or heartburn.    acetaminophen (TYLENOL) 500 MG tablet Take 500 mg  by mouth every 6 (six) hours as needed (for pain).       STOP taking these medications     aspirin EC 81 MG tablet      sulfamethoxazole-trimethoprim (BACTRIM DS,SEPTRA DS) 800-160 MG per tablet        No Known Allergies Follow-up Information    Follow up with Jory Sims, NP On 05/12/2015.   Specialties:  Nurse Practitioner, Radiology, Cardiology   Why:  FOLLOW-UP APPT ON JUNE 7,2016 AT 2:10   Contact information:   [place] [address] [phone]        The results of significant diagnostics from this hospitalization (including imaging, microbiology, ancillary and laboratory) are listed below for reference.    Significant Diagnostic Studies: Dg Chest Port 1 View  05/06/2015   CLINICAL DATA:  Began having LEFT side chest pain at 0000 hours this morning, shortness of breath, diaphoresis, history atrial fibrillation, coronary artery disease, non ischemic cardiomyopathy, smoking  EXAM: PORTABLE CHEST - 1 VIEW  COMPARISON:  Portable exam 1330 hours compared to 11/21/2014  FINDINGS: Enlargement of cardiac silhouette.  Mediastinal contours and pulmonary vascularity normal.  Lungs clear.  No pleural effusion or pneumothorax.  Bones unremarkable.  IMPRESSION: Enlargement of cardiac silhouette without acute infiltrate.   Electronically Signed   By: Lavonia Dana M.D.   On: 05/06/2015 14:00    Microbiology: No results found for this or any previous visit (from the past 240 hour(s)).   Labs: Basic Metabolic Panel:  Recent Labs Lab 05/06/15 1315 05/07/15 0645  NA 136 138  K 3.7 4.0  CL 103 106  CO2 24 26  GLUCOSE 100* 113*  BUN 21* 15  CREATININE 1.08 0.88  CALCIUM 9.4 8.8*  MG  --  2.0   Liver Function Tests: No results for input(s): AST, ALT, ALKPHOS, BILITOT, PROT, ALBUMIN in the last 168 hours. No results for input(s): LIPASE, AMYLASE in the last 168 hours. No results for input(s): AMMONIA in the last 168 hours. CBC:  Recent Labs Lab 05/06/15 1315  05/07/15 0645  WBC 5.9 5.2  HGB 15.3 14.1  HCT 45.2 43.5  MCV 87.8 88.8  PLT 177 169   Cardiac Enzymes:  Recent Labs Lab 05/06/15 1315 05/06/15 1803 05/06/15 2009 05/06/15 2306  TROPONINI <0.03 <0.03 <0.03 <0.03   BNP: BNP (last 3 results) No results for input(s): BNP in the last 8760 hours.  ProBNP (last 3 results) No results for input(s): PROBNP in the last 8760 hours.  CBG: No results for input(s): GLUCAP in the last 168 hours.     SignedRadene Gunning  [facility] 05/07/2015, 2:27 PM

## 2015-05-07 NOTE — Care Management Note (Signed)
Case Management Note  Patient Details  Name: David Dougherty MRN: [id number] Date of Birth: 1951-09-16  Subjective/Objective:                  Pt admitted from home with CP. Pt lives with his wife and will return home at discharge. Pt is independent with ADl's.  Action/Plan: Pt discharged home today. Pt given xarelto drug card.  Expected Discharge Date:  05/09/15               Expected Discharge Plan:  Home/Self Care  In-House Referral:  NA  Discharge planning Services  CM Consult  Post Acute Care Choice:  NA Choice offered to:  NA  DME Arranged:    DME Agency:     HH Arranged:    HH Agency:     Status of Service:  Completed, signed off  Medicare Important Message Given:    Date Medicare IM Given:    Medicare IM give by:    Date Additional Medicare IM Given:    Additional Medicare Important Message give by:     If discussed at [place] of Stay Meetings, dates discussed:    Additional Comments:  Joylene Draft, RN 05/07/2015, 2:01 PM

## 2015-05-12 ENCOUNTER — Ambulatory Visit: Payer: BLUE CROSS/BLUE SHIELD | Admitting: Adult Health

## 2015-05-13 ENCOUNTER — Ambulatory Visit (INDEPENDENT_AMBULATORY_CARE_PROVIDER_SITE_OTHER): Payer: BLUE CROSS/BLUE SHIELD | Admitting: Cardiology

## 2015-05-13 ENCOUNTER — Encounter: Payer: Self-pay | Admitting: Cardiology

## 2015-05-13 VITALS — BP 128/78 | HR 55 | Ht 73.0 in | Wt 209.0 lb

## 2015-05-13 DIAGNOSIS — I429 Cardiomyopathy, unspecified: Secondary | ICD-10-CM

## 2015-05-13 DIAGNOSIS — I482 Chronic atrial fibrillation, unspecified: Secondary | ICD-10-CM

## 2015-05-13 DIAGNOSIS — I2510 Atherosclerotic heart disease of native coronary artery without angina pectoris: Secondary | ICD-10-CM | POA: Diagnosis not present

## 2015-05-13 NOTE — Progress Notes (Signed)
Cardiology Office Note  Date: 05/13/2015   ID: David, Dougherty Feb 20, 1951, MRN [id number]  PCP: Sallee Lange, MD  Primary Cardiologist: Rozann Lesches, MD   Chief Complaint  Patient presents with  . Hospitalization Follow-up    History of Present Illness: David Dougherty is a 64 y.o. male that I last saw in March. At that time we continued medical therapy with modifications made, and plan for follow-up assessment of LVEF with known nonischemic cardiomyopathy. He had also preferred to stay on aspirin instead of anticoagulation with persistent atrial fibrillation. Recent records reviewed, patient was admitted to [facility] just recently with chest discomfort. He ruled out for ACS with normal troponin I levels and was seen by Dr. Bronson Ing in consultation - I reviewed his note. His medications were modified, aspirin was stopped and he agreed to start Xarelto for stroke prophylaxis with CHADSVASC score of 2.   Most recent follow-up echocardiogram from April is noted below. He had continued evidence of severe LV dysfunction at that time despite medical therapy. Interestingly however, I see in Dr. Lance Sell office visit notes in the interim, David Dougherty had actually stopped taking his cardiac medications, so it is not entirely clear to me how consistent he has been with treatment.  He comes today with his wife for a follow-up visit. He has been frustrated overall, does not seem to be at all clear about his cardiac diagnosis, treatment options, and prognosis. I have discussed all these issues with him before, but we again had a detailed discussion today regarding his diagnosis of atrial fibrillation, nonischemic cardiomyopathy, single vessel CAD, and rationale for medical therapy including anticoagulation for stroke prophylaxis. He tells me that he did stop all of his medications as Dr. Wolfgang Phoenix had mentioned, but has been back on them at least over the last few weeks.   Today I reinforced  with him compliance with regular cardiac medical regimen as his best option for maintaining clinical stability, and perhaps seeing some improvement in LVEF (although this has not been the case as yet). We also talked about referral for EP consultation to discuss prophylactic ICD to reduce risk of sudden cardiac death. After discussion, he made it clear that he did not want to consider a defibrillator at this time.  He does state that he has been taking Xarelto as directed, has had no bleeding problems. He was under the impression that this medication was there to help his heart function improve, but we clarified this today.   Past Medical History  Diagnosis Date  . GERD (gastroesophageal reflux disease)   . Tobacco abuse     Snuff  . Atrial fibrillation   . Nonischemic cardiomyopathy   . CAD (coronary artery disease)     70% distal circumflex 11/2014     Past Surgical History  Procedure Laterality Date  . Appendectomy    . Tonsillectomy    . Colonoscopy  2005    Neg  . Left and right heart catheterization with coronary angiogram N/A 12/03/2014    Procedure: LEFT AND RIGHT HEART CATHETERIZATION WITH CORONARY ANGIOGRAM;  Surgeon: Blane Ohara, MD;  Location: [facility] CATH LAB;  Service: Cardiovascular;  Laterality: N/A;  . Colonoscopy N/A 01/05/2015    SLF:17 colon polyps removed/moderate sized hemorrhoids  . 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 Hemorrhoid banding N/A 01/05/2015    Procedure: HEMORRHOID BANDING;  Surgeon: Danie Binder, MD;  Location: [facility] ENDO SUITE;  Service: Endoscopy;  Laterality: N/A;    Current Outpatient Prescriptions  Medication Sig Dispense Refill  . acetaminophen (TYLENOL) 500 MG tablet Take 500 mg by mouth every 6 (six) hours as needed (for pain).     . calcium carbonate (TUMS - DOSED IN MG ELEMENTAL CALCIUM) 500 MG chewable tablet Chew 1 tablet by  mouth as needed for indigestion or heartburn.    . carvedilol (COREG) 3.125 MG tablet Take 1 tablet (3.125 mg total) by mouth 2 (two) times daily.  0  . losartan (COZAAR) 25 MG tablet Take 1 tablet (25 mg total) by mouth daily.    . nitroGLYCERIN (NITROSTAT) 0.4 MG SL tablet Place 1 tablet (0.4 mg total) under the tongue every 5 (five) minutes as needed for chest pain. 30 tablet 12  . omeprazole (PRILOSEC) 20 MG capsule Take 1 capsule (20 mg total) by mouth daily as needed (acid reflux).    . rivaroxaban (XARELTO) 20 MG TABS tablet Take 1 tablet (20 mg total) by mouth daily. 30 tablet 1  . spironolactone (ALDACTONE) 25 MG tablet Take 1 tablet (25 mg total) by mouth daily.     No current facility-administered medications for this visit.    Allergies:  Review of patient's allergies indicates no known allergies.   Social History: The patient  reports that he quit smoking about 42 years ago. His smoking use included Cigars and Cigarettes. He started smoking about 45 years ago. He has a .75 pack-year smoking history. His smokeless tobacco use includes Snuff. He reports that he does not drink alcohol or use illicit drugs.   ROS:  Please see the history of present illness. Otherwise, complete review of systems is positive for fatigue at times.  All other systems are reviewed and negative.   Physical Exam: VS:  BP 128/78 mmHg  Pulse 55  Ht 6\' 1"  (1.854 m)  Wt 209 lb (94.802 kg)  BMI 27.58 kg/m2  SpO2 96%, BMI Body mass index is 27.58 kg/(m^2).  Wt Readings from Last 3 Encounters:  05/13/15 209 lb (94.802 kg)  05/06/15 203 lb 9.6 oz (92.352 kg)  04/10/15 210 lb (95.255 kg)     Patient appears comfortable at rest. HEENT: Conjunctiva and lids normal, oropharynx clear. Neck: Supple, no elevated JVP or carotid bruits, no thyromegaly. Lungs: Clear to auscultation, nonlabored breathing at rest. Cardiac: Indistinct PMI, irregularly irregular, no S3, soft systolic murmur, no pericardial  rub. Abdomen: Soft, nontender, bowel sounds present, no guarding or rebound. Extremities: 1+ edema, distal pulses 2+. Skin: Warm and dry. Musculoskeletal: No kyphosis. Neuropsychiatric: Alert and oriented x3, affect grossly appropriate.   ECG: Tracing from 05/06/2015 showed atrial fibrillation with nonspecific T-wave changes.  Recent Labwork: 11/13/2014: ALT 18; AST 18 04/14/2015: TSH 3.210 05/07/2015: BUN 15; Creatinine, Ser 0.88; Hemoglobin 14.1; Magnesium 2.0; Platelets 169; Potassium 4.0; Sodium 138     Component Value Date/Time   CHOL 179 11/13/2014 0930   TRIG 72 11/13/2014 0930   TRIG 50 08/12/2010 0000   HDL 41 11/13/2014 0930   CHOLHDL 4.4 11/13/2014 0930   VLDL 14 11/13/2014 0930   LDLCALC 124* 11/13/2014 0930   LDLCALC 109 08/12/2010 0000    Other Studies Reviewed Today:  1. Echocardiogram 03/12/2015: Study Conclusions  - Left ventricle: The cavity size was normal. There was mild concentric hypertrophy. Systolic function was severely reduced. The estimated ejection fraction was in the range of 20% to 25%. Diffusely hypokinetic. The study was not technically sufficient to  allow evaluation of LV diastolic dysfunction due to atrial fibrillation. - Aortic valve: Mildly calcified annulus. Trileaflet. There was trivial regurgitation. - Aorta: Aortic root upper limits of normal in diameter to mildly dilated. Aortic root dimension: 39 mm (ED). - Mitral valve: Mildly calcified annulus. There was mild regurgitation. - Left atrium: The atrium was moderately dilated. Volume/bsa, ES, (1-plane Simpson&'s, A2C): 38.8 ml/m^2. - Right ventricle: Systolic function was mildly to moderately reduced. - Right atrium: The atrium was moderately dilated. - Tricuspid valve: There was mild-moderate regurgitation.  Impressions:  - LV systolic function remains severely reduced.  2. Cardiac catheterization performed in December 2015 by Dr. Burt Knack was consistent with  nonischemic cardiomyopathy No major obstructive CAD in the large epicardials, however 70% distal AV circumflex stenosis that was managed medically. Cardiac output was normal, PCWP 15, mean PA 19.   ASSESSMENT AND PLAN:  1. Chronic atrial fibrillation. Plan at this time is to continue Coreg, agree with the addition of Xarelto and discontinuation of aspirin for stroke prophylaxis. He does not endorse any palpitations, and it is not clear to me that cardioversion would necessarily changes overall course. We will continue to monitor him over time.  2. Nonischemic cardiomyopathy with LVEF 20-25% by echocardiogram in April. Although he has not shown any significant improvement in LV function as yet, he has had documented medication noncompliance. We reinforced medication use today, and he also made it clear that he did not want to consider referral to EP for discussion about prophylactic ICD placement at this time.  3. Single vessel CAD involving circumflex, not clearly having angina symptoms. The chest pain he describes prompting recent hospitalization was  atypical. He does not report any exertional chest pain at this point.  Current medicines were reviewed at length with the patient today.   Orders Placed This Encounter  Procedures  . CBC  . Basic Metabolic Panel (BMET)    Disposition: FU with me in 3 months.   Signed, Satira Sark, MD, [facility] 05/13/2015 11:31 AM    [facility]. [address], [place], [place] [address] Phone: [phone]; Fax: [phone]

## 2015-05-13 NOTE — Patient Instructions (Signed)
Your physician recommends that you schedule a follow-up appointment in: 3 months with Dr Domenic Polite  Your physician recommends that you continue on your current medications as directed. Please refer to the Current Medication list given to you today.    Please get blood work JUST BEFORE next visit      Thank you for choosing [facility] !

## 2015-05-18 ENCOUNTER — Telehealth: Payer: Self-pay | Admitting: Cardiology

## 2015-05-18 ENCOUNTER — Ambulatory Visit (HOSPITAL_COMMUNITY)
Admission: RE | Admit: 2015-05-18 | Discharge: 2015-05-18 | Disposition: A | Discharge status: Home / Self Care | Payer: BLUE CROSS/BLUE SHIELD | Source: Ambulatory Visit | Attending: Cardiology | Admitting: Cardiology

## 2015-05-18 DIAGNOSIS — R51 Headache: Secondary | ICD-10-CM | POA: Diagnosis present

## 2015-05-18 DIAGNOSIS — R519 Headache, unspecified: Secondary | ICD-10-CM

## 2015-05-18 NOTE — Telephone Encounter (Signed)
CT Of the head w/o contrast /headache on Xalrto need to scheduled ASAP  They will not schedule until precert is completed

## 2015-05-18 NOTE — Telephone Encounter (Signed)
Approved and in epic.  Cher Nakai aware

## 2015-05-18 NOTE — Telephone Encounter (Signed)
Wife states since patient started Xarelto he has had headaches daily, barely relieved by Tylenol

## 2015-05-18 NOTE — Telephone Encounter (Signed)
Wife aware to [place] today at soon as patient can come to hospital

## 2015-05-18 NOTE — Telephone Encounter (Signed)
Pt is having severe headaches thinks it may be due to the Xarelto

## 2015-05-18 NOTE — Telephone Encounter (Signed)
Patient started on Xarelto by Dr. Bronson Ing during recent hospital stay. He was not complaining of any headaches when I saw him in the office last week. Have him stop Xarelto for now. Would also recommend that he get a noncontrasted head CT today to exclude any acute bleeding issues.

## 2015-05-26 ENCOUNTER — Ambulatory Visit (INDEPENDENT_AMBULATORY_CARE_PROVIDER_SITE_OTHER): Payer: BLUE CROSS/BLUE SHIELD | Admitting: Family Medicine

## 2015-05-26 ENCOUNTER — Encounter: Payer: Self-pay | Admitting: Family Medicine

## 2015-05-26 VITALS — BP 118/82 | Temp 98.4°F | Ht 71.0 in | Wt 209.0 lb

## 2015-05-26 DIAGNOSIS — R519 Headache, unspecified: Secondary | ICD-10-CM

## 2015-05-26 DIAGNOSIS — R51 Headache: Secondary | ICD-10-CM

## 2015-05-26 NOTE — Progress Notes (Signed)
   Subjective:    Patient ID: David Dougherty, male    DOB: 1951-04-04, 64 y.o.   MRN: [id number]  Headache  This is a new problem. Episode onset: 2 weeks ago. Pertinent negatives include no coughing, fever, nausea or vomiting.  headache started after starting xarelto. Pt called cardiologist. Pt states they told him to hold off on xarelto and have ct scan done. Pt stopped xarelto on 6/13 and had ct head without contrast done. Pt is still having a headache. Taking ibuprofen. It helps for a little while.   I reviewed over his CAT scan and inform the patient on his results.  Review of Systems  Constitutional: Negative for fever, chills and fatigue.  HENT: Negative for congestion.   Respiratory: Negative for cough.   Gastrointestinal: Negative for nausea and vomiting.  Neurological: Positive for headaches.       Objective:   Physical Exam No neurologic deficits noted heart irregular rate controlled blood pressure good mild subjective temporal pain both sides but no tenderness. Neurologic grossly normal       Assessment & Plan:  Bilateral temporal headache-check sedimentation rate. More than likely will restart his blood thinner. May need to place him on different medicine. Will not be able to take ibuprofen with these medicines.

## 2015-05-27 LAB — SEDIMENTATION RATE: Sed Rate: 19 mm/hr (ref 0–30)

## 2015-06-01 ENCOUNTER — Encounter: Payer: Self-pay | Admitting: Family Medicine

## 2015-06-01 ENCOUNTER — Ambulatory Visit (INDEPENDENT_AMBULATORY_CARE_PROVIDER_SITE_OTHER): Payer: BLUE CROSS/BLUE SHIELD | Admitting: Family Medicine

## 2015-06-01 VITALS — BP 138/80 | Ht 71.0 in | Wt 213.1 lb

## 2015-06-01 DIAGNOSIS — R51 Headache: Secondary | ICD-10-CM

## 2015-06-01 DIAGNOSIS — R519 Headache, unspecified: Secondary | ICD-10-CM

## 2015-06-01 MED ORDER — TOPIRAMATE 50 MG PO TABS: 50.0000 mg | ORAL_TABLET | Freq: Two times a day (BID) | ORAL | Status: DC

## 2015-06-01 NOTE — Progress Notes (Signed)
   Subjective:    Patient ID: David Dougherty, male    DOB: 1951/06/17, 64 y.o.   MRN: [id number]  Headache  This is a recurrent problem. The current episode started 1 to 4 weeks ago. The problem occurs daily. The problem has been unchanged. The pain is located in the bilateral and temporal region. The pain does not radiate. The quality of the pain is described as aching. The pain is at a severity of 6/10. The pain is moderate. Associated symptoms include rhinorrhea (clear). Pertinent negatives include no abdominal pain, fever, nausea or vomiting. Cough: occasional.   Patient is here today to discuss his recent blood work results. Patient is also here for a follow up visit on headaches. Patient states that his headaches have not improved at all.   Patient states that he has some acid reflux issues but he has an appointment with Dr. Oneida Alar about this tomorrow.   [place] Headaches no betteres Decaff tea, drinks pepsi 2 per day No new meds Review of Systems  Constitutional: Negative for fever and fatigue.  HENT: Positive for rhinorrhea (clear).   Respiratory: Cough: occasional.   Gastrointestinal: Negative for nausea, vomiting, abdominal pain and diarrhea.  Neurological: Positive for headaches.       Objective:   Physical Exam  On examination lungs clear heart regular temporal area nontender neurologically normal no focal findings      Assessment & Plan:  Patient with ongoing headaches CAT scan nonremarkable. Sedimentation rate normal. Start Topamax 50 mg daily at bedtime for the first 2 weeks then more than likely will increase it to twice daily. Family to give [place] update in a couple weeks how things are going if ongoing troubles may need referral to neurology. Patient does not want to go neurologist currently. I told him to a avoid ibuprofen. Use Tylenol instead. If not doing significantly better may need neurology  Reinitiate blood thinners in 2 weeks if patient is doing good job  without having to take ibuprofen

## 2015-06-01 NOTE — Patient Instructions (Signed)
Call us in 2 weeks with headache update  Use tylenol only  Topamax 50 mg , 1 qhs for first 2 weeks

## 2015-06-02 ENCOUNTER — Encounter: Payer: Self-pay | Admitting: Nurse Practitioner

## 2015-06-02 ENCOUNTER — Ambulatory Visit (INDEPENDENT_AMBULATORY_CARE_PROVIDER_SITE_OTHER): Payer: BLUE CROSS/BLUE SHIELD | Admitting: Nurse Practitioner

## 2015-06-02 VITALS — BP 136/85 | HR 68 | Temp 98.2°F | Ht 73.0 in | Wt 211.8 lb

## 2015-06-02 DIAGNOSIS — R195 Other fecal abnormalities: Secondary | ICD-10-CM | POA: Diagnosis not present

## 2015-06-02 DIAGNOSIS — K649 Unspecified hemorrhoids: Secondary | ICD-10-CM | POA: Insufficient documentation

## 2015-06-02 DIAGNOSIS — K2100 Gastro-esophageal reflux disease with esophagitis, without bleeding: Secondary | ICD-10-CM

## 2015-06-02 NOTE — Patient Instructions (Signed)
1. Continue taking the acid blocking medicine (Prilosec). 2. Return to stool samples to the lab 1 year able to. 3. Add Benefiber 2 teaspoons daily to your diet to help increase fiber supplementation. 4. Return in 1-2 months to see Dr. Oneida Alar to evaluate hemorrhoids status post banding and if any other banding needs to occur.

## 2015-06-02 NOTE — Progress Notes (Signed)
Referring Provider: Kathyrn Drown, MD Primary Care Physician:  Sallee Lange, MD Primary GI:  Dr. Oneida Alar  Chief Complaint  Patient presents with  . Follow-up    HPI:   64 year old male presents for follow-up on acid reflux. Colonoscopy done on 01/05/2015 for hematochezia which performed with conscious sedation. Findings included 17 colon polyps which were removed, moderate size hemorrhoids which were banded in the procedure. Recommend next colonoscopy in 3 years and recommended 3 week follow-up to follow-up on hemorrhoid banding (which was not done.) Pathology found all polyps to be either tubular adenoma or hyperplastic. An endoscopy done the same day for Barrett's screening found stricture of the EG junction most likely due to acid reflux, probable Barrett's esophagus, small hiatal hernia, mild erosive gastritis and duodenitis. Stomach biopsies found associated mild chronic inflammation without H. 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 Recommended adequate omeprazole 30 minutes prior to breakfast forever. Recommend repeat office visit in 1 year for follow up EGD for Barrett's screening.  Today he states he had stopped taking his PPI because of a news story about dangers of PPI. His symptoms subsequently returned. However, he has started his PPI back today again. While off his PPI was taking TUMS with some relief.   Is also complaining of soft bowel movements which are "mostly watery" since having the colonoscopy. His bowel movements start at [place] 4 and will end up at 7. When his stools are watery they feel "like acid." Has 2+ bowel movements a day. Is also having some toilet tissue hematochezia as well and can feel his hemorrhoids bulging out, though not as bad as it was before the intra-procedure hemorrhoid banding. Denies frank rectal bleeding. Preparation H helps generally.  Denies chest pain, dyspnea, dizziness,  lightheadedness, syncope, near syncope. Denies any other upper or lower GI symptoms.  Past Medical History  Diagnosis Date  . GERD (gastroesophageal reflux disease)   . Tobacco abuse     Snuff  . Atrial fibrillation   . Nonischemic cardiomyopathy   . CAD (coronary artery disease)     70% distal circumflex 11/2014     Past Surgical History  Procedure Laterality Date  . Appendectomy    . Tonsillectomy    . Colonoscopy  2005    Neg  . Left and right heart catheterization with coronary angiogram N/A 12/03/2014    Procedure: LEFT AND RIGHT HEART CATHETERIZATION WITH CORONARY ANGIOGRAM;  Surgeon: Blane Ohara, MD;  Location: [facility] CATH LAB;  Service: Cardiovascular;  Laterality: N/A;  . Colonoscopy N/A 01/05/2015    SLF:17 colon polyps removed/moderate sized hemorrhoids  . 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 Hemorrhoid banding N/A 01/05/2015    Procedure: HEMORRHOID BANDING;  Surgeon: Danie Binder, MD;  Location: [facility] ENDO SUITE;  Service: Endoscopy;  Laterality: N/A;    Current Outpatient Prescriptions  Medication Sig Dispense Refill  . aspirin 81 MG tablet Take 81 mg by mouth daily.    . calcium carbonate (TUMS - DOSED IN MG ELEMENTAL CALCIUM) 500 MG chewable tablet Chew 1 tablet by mouth as needed for indigestion or heartburn.    . carvedilol (COREG) 3.125 MG tablet Take 1 tablet (3.125 mg total) by mouth 2 (two) times daily.  0  . losartan (COZAAR) 25 MG tablet Take 1 tablet (25 mg total) by mouth daily.    . nitroGLYCERIN (NITROSTAT) 0.4 MG SL  tablet Place 1 tablet (0.4 mg total) under the tongue every 5 (five) minutes as needed for chest pain. 30 tablet 12  . omeprazole (PRILOSEC) 20 MG capsule Take 1 capsule (20 mg total) by mouth daily as needed (acid reflux).    . rivaroxaban (XARELTO) 20 MG TABS tablet Take 1 tablet (20 mg total) by mouth daily. 30 tablet 1  .  spironolactone (ALDACTONE) 25 MG tablet Take 1 tablet (25 mg total) by mouth daily.    Marland Kitchen topiramate (TOPAMAX) 50 MG tablet Take 1 tablet (50 mg total) by mouth 2 (two) times daily. 60 tablet 2   No current facility-administered medications for this visit.    Allergies as of 06/02/2015  . (No Known Allergies)    Family History  Problem Relation Age of Onset  . Heart attack Father   . Hypertension Father   . Diabetes Father   . Colon cancer Neg Hx   . Colon polyps Neg Hx     History   Social History  . Marital Status: Married    Spouse Name: N/A  . Number of Children: N/A  . Years of Education: N/A   Occupational History  . 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 Smoking status: Former Smoker -- 0.25 packs/day for 3 years    Types: 81, Cigarettes    Start date: 10/19/1969    Quit date: 10/22/1972  . Smokeless tobacco: Current User    Types: Snuff  . Alcohol Use: No  . Drug Use: No  . Sexual Activity: Not on file   Other Topics Concern  . None   Social History Narrative   Married with 5 children   No regular exercise    Review of Systems: 10-point ROS negative except as per HPI.   Physical Exam: BP 136/85 mmHg  Pulse 68  Temp(Src) 98.2 F (36.8 C) (Oral)  Ht 6\' 1"  (1.854 m)  Wt 211 lb 12.8 oz (96.072 kg)  BMI 27.95 kg/m2 General:   Alert and oriented. Pleasant and cooperative. Well-nourished and well-developed.  Head:  Normocephalic and atraumatic. Eyes:  Without icterus, sclera clear and conjunctiva pink.  Ears:  Normal auditory acuity. Cardiovascular:  S1, S2 present without murmurs appreciated. Normal pulses noted. Extremities without clubbing or edema. Respiratory:  Clear to auscultation bilaterally. No wheezes, rales, or rhonchi. No distress.  Gastrointestinal:  +BS, soft, non-tender and non-distended. No HSM noted. No guarding or rebound. No masses appreciated.  Rectal:  Deferred  Neurologic:  Alert and oriented x4;   grossly normal neurologically. Psych:  Alert and cooperative. Normal mood and affect. Heme/Lymph/Immune: No excessive bruising noted.    06/02/2015 8:35 AM

## 2015-06-04 NOTE — Assessment & Plan Note (Signed)
Patient with GERD and was previously on a PPI which he electively stop on his own and switched for Tums due to hearing a new story that PPIs. 3. Due to his rebound symptoms he is since recently started taking his PPI again in the past couple days. Recommend continue his PPIs and we can reassess on his next visit. Follow-up in 1-2 months.

## 2015-06-04 NOTE — Assessment & Plan Note (Signed)
64 year old male who had a colonoscopy done on 01/05/2015 for hematochezia concluded banding of moderate size hemorrhoids. States his symptoms improved however he is recently started having bulging of his hemorrhoids began. Is also having softer to loose stools. We will have him follow up in one to 2 months with Dr. Oneida Alar to reevaluate his hemorrhoids and the possibility of further banding in office.

## 2015-06-04 NOTE — Assessment & Plan Note (Signed)
Patient is having looser stools and normal although there fluctuating between [place] 4 and [place] 7. Usually starts is more solid stools and in has diarrhea subsequently after his initial bowel movement. This point we'll check stool studies to rule out any infection which I think is unlikely. Also try to bulk his stools with Benefiber 2 teaspoons daily. Follow-up in 1-2 months

## 2015-06-04 NOTE — Progress Notes (Signed)
CC'ED TO PCP 

## 2015-06-20 LAB — CLOSTRIDIUM DIFFICILE BY PCR: Toxigenic C. Difficile by PCR: NOT DETECTED

## 2015-06-22 LAB — GIARDIA ANTIGEN: Giardia Screen (EIA): NEGATIVE

## 2015-06-23 LAB — STOOL CULTURE

## 2015-06-26 ENCOUNTER — Ambulatory Visit (INDEPENDENT_AMBULATORY_CARE_PROVIDER_SITE_OTHER): Payer: BLUE CROSS/BLUE SHIELD | Admitting: Family Medicine

## 2015-06-26 ENCOUNTER — Encounter: Payer: Self-pay | Admitting: Family Medicine

## 2015-06-26 VITALS — BP 118/74 | Ht 71.0 in | Wt 210.0 lb

## 2015-06-26 DIAGNOSIS — I481 Persistent atrial fibrillation: Secondary | ICD-10-CM

## 2015-06-26 DIAGNOSIS — I4819 Other persistent atrial fibrillation: Secondary | ICD-10-CM

## 2015-06-26 NOTE — Progress Notes (Signed)
   Subjective:    Patient ID: David Dougherty, male    DOB: 1951/08/31, 64 y.o.   MRN: [id number]  HPIpt arrives today with wife sheila for a follow up on headaches. Pt states he is not having any more headaches.   He has concerns about knot on left palm. Came up about 1 week ago. Pt states it is painful.  Denies bleeding issues denies chest tightness pressure pain shortness of breath Pt is not taking xarelto.   Review of Systems  Constitutional: Negative for activity change, appetite change and fatigue.  HENT: Negative for congestion.   Respiratory: Negative for cough.   Cardiovascular: Negative for chest pain.  Gastrointestinal: Negative for abdominal pain.  Endocrine: Negative for polydipsia and polyphagia.  Neurological: Negative for weakness.  Psychiatric/Behavioral: Negative for confusion.       Objective:   Physical Exam  Constitutional: He appears well-nourished. No distress.  Cardiovascular: Normal rate, regular rhythm and normal heart sounds.   No murmur heard. Pulmonary/Chest: Effort normal and breath sounds normal. No respiratory distress.  Musculoskeletal: He exhibits no edema.  Lymphadenopathy:    He has no cervical adenopathy.  Neurological: He is alert.  Psychiatric: His behavior is normal.  Vitals reviewed.         Assessment & Plan:  Patient does not want to go back to see cardiology. He states he will do management of his illness here  He has persistent atrial fib with can a low ejection fraction he is to continue his medicines this patient is not taking any type of blood better I explained to the patient in detail the benefit being on a Coumadin or one of the newer blood thinners he does not want any of those. He states he will only take 81 mg aspirin he understands the risk of stroke.  He will follow-up 3-4 weeks he will have further thoughts regarding blood thinners  Headaches are doing better he will taper off Topamax over the next 3-4  weeks  The small nodule on his palm has to do with the tendon there is no need for any type of intervention

## 2015-07-17 ENCOUNTER — Ambulatory Visit: Payer: BLUE CROSS/BLUE SHIELD | Admitting: Family Medicine

## 2015-07-21 ENCOUNTER — Encounter: Payer: BLUE CROSS/BLUE SHIELD | Admitting: Gastroenterology

## 2015-08-18 ENCOUNTER — Encounter: Payer: Self-pay | Admitting: Cardiology

## 2015-08-18 ENCOUNTER — Encounter: Payer: BLUE CROSS/BLUE SHIELD | Admitting: Cardiology

## 2015-08-18 NOTE — Progress Notes (Signed)
No show  This encounter was created in error - please disregard.

## 2015-10-05 ENCOUNTER — Other Ambulatory Visit: Payer: Self-pay | Admitting: Cardiology

## 2015-10-05 ENCOUNTER — Other Ambulatory Visit: Payer: Self-pay | Admitting: Family Medicine

## 2015-11-18 ENCOUNTER — Emergency Department (HOSPITAL_COMMUNITY): Payer: Medicare Other

## 2015-11-18 ENCOUNTER — Encounter (HOSPITAL_COMMUNITY): Payer: Self-pay | Admitting: Emergency Medicine

## 2015-11-18 ENCOUNTER — Emergency Department (HOSPITAL_COMMUNITY)
Admission: EM | Admit: 2015-11-18 | Discharge: 2015-11-18 | Disposition: A | Discharge status: Home / Self Care | Payer: Medicare Other | Attending: Emergency Medicine | Admitting: Emergency Medicine

## 2015-11-18 DIAGNOSIS — I429 Cardiomyopathy, unspecified: Secondary | ICD-10-CM | POA: Diagnosis not present

## 2015-11-18 DIAGNOSIS — K219 Gastro-esophageal reflux disease without esophagitis: Secondary | ICD-10-CM | POA: Insufficient documentation

## 2015-11-18 DIAGNOSIS — Z7982 Long term (current) use of aspirin: Secondary | ICD-10-CM | POA: Insufficient documentation

## 2015-11-18 DIAGNOSIS — I4891 Unspecified atrial fibrillation: Secondary | ICD-10-CM | POA: Diagnosis not present

## 2015-11-18 DIAGNOSIS — R55 Syncope and collapse: Secondary | ICD-10-CM | POA: Diagnosis not present

## 2015-11-18 DIAGNOSIS — Z79899 Other long term (current) drug therapy: Secondary | ICD-10-CM | POA: Diagnosis not present

## 2015-11-18 DIAGNOSIS — Z87891 Personal history of nicotine dependence: Secondary | ICD-10-CM | POA: Diagnosis not present

## 2015-11-18 DIAGNOSIS — Z9889 Other specified postprocedural states: Secondary | ICD-10-CM | POA: Diagnosis not present

## 2015-11-18 DIAGNOSIS — M542 Cervicalgia: Secondary | ICD-10-CM | POA: Diagnosis not present

## 2015-11-18 DIAGNOSIS — I2510 Atherosclerotic heart disease of native coronary artery without angina pectoris: Secondary | ICD-10-CM | POA: Insufficient documentation

## 2015-11-18 DIAGNOSIS — R42 Dizziness and giddiness: Secondary | ICD-10-CM | POA: Diagnosis present

## 2015-11-18 LAB — BASIC METABOLIC PANEL
Anion gap: 9 (ref 5–15)
BUN: 14 mg/dL (ref 6–20)
CO2: 25 mmol/L (ref 22–32)
Calcium: 9.2 mg/dL (ref 8.9–10.3)
Chloride: 105 mmol/L (ref 101–111)
Creatinine, Ser: 1.05 mg/dL (ref 0.61–1.24)
GFR calc Af Amer: >60 mL/min (ref >60)
GFR calc non Af Amer: >60 mL/min (ref >60)
Glucose, Bld: 96 mg/dL (ref 65–99)
Potassium: 4.3 mmol/L (ref 3.5–5.1)
Sodium: 139 mmol/L (ref 135–145)

## 2015-11-18 LAB — CBC
HCT: 42.7 % (ref 39.0–52.0)
Hemoglobin: 14.3 g/dL (ref 13.0–17.0)
MCH: 30 pg (ref 26.0–34.0)
MCHC: 33.5 g/dL (ref 30.0–36.0)
MCV: 89.7 fL (ref 78.0–100.0)
Platelets: 204 10*3/uL (ref 150–400)
RBC: 4.76 MIL/uL (ref 4.22–5.81)
RDW: 12.8 % (ref 11.5–15.5)
WBC: 5.7 10*3/uL (ref 4.0–10.5)

## 2015-11-18 LAB — PROTIME-INR
INR: 1.08 (ref 0.00–1.49)
Prothrombin Time: 14.2 seconds (ref 11.6–15.2)

## 2015-11-18 LAB — TROPONIN I: Troponin I: <0.03 ng/mL (ref <0.031)

## 2015-11-18 LAB — APTT: aPTT: 30 seconds (ref 24–37)

## 2015-11-18 MED ORDER — ASPIRIN 81 MG PO CHEW
324.0000 mg | CHEWABLE_TABLET | Freq: Once | ORAL | Status: AC
  Administered 2015-11-18: 243 mg via ORAL
  Filled 2015-11-18: qty 4

## 2015-11-18 NOTE — Discharge Instructions (Signed)

## 2015-11-18 NOTE — ED Notes (Signed)
Discussed pt with MD - verbal orders received

## 2015-11-18 NOTE — ED Provider Notes (Signed)
CSN: [id number]     Arrival date & time 11/18/15  1430 History   First MD Initiated Contact with Patient 11/18/15 1545     Chief Complaint  Patient presents with  . Dizziness     (Consider location/radiation/quality/duration/timing/severity/associated sxs/prior Treatment) HPI Comments: 64 y/o male with hx of A fib and CAD - known occlusion in the OM1 who has been seen by Cards, cath'd 1 year ago and is followed by his PCP Dr. Wolfgang Phoenix for all of his PMH - he is on meds for his heart / rate and for hypertension - he is not taking xarelto anymore.  He states that he was helping family out at the automobile shop today - he had bent over to clean up some oil on the floor after which he became acutely light headed a short time later and had to sit down as he felt near syncopal - he had a feeling of his eyes being swollen and full and felt as though he would pass out - this lasted about 30 minutes and the n resolved - he had blurred vision but no diplopia and no weakness or numbness or difficulty speaking - he was able to speak.  He has had this occur a couple of times in the past with similar sx.  He reports that a this time he has had 3 cookies to eat today - no other intake and no other fluids.  He has no CP, no palpitations, SOB, or dysuria, diarrhea, swelling, rashes or any other c/o.  He is asymptomatic at this time.  He does report that 2 weeks ago he struck his head on teh undercarriage of a hydraulic car lift and caused his head and neck to go into hyperextention and since has had some posterior R neck pain and amild headahce.  No numbness or weakness of the arms or legs.  Normal gait and balance since.  Patient is a 64 y.o. male presenting with dizziness. The history is provided by the patient and the spouse.  Dizziness   Past Medical History  Diagnosis Date  . GERD (gastroesophageal reflux disease)   . Tobacco abuse     Snuff  . Atrial fibrillation ([place])   . Nonischemic cardiomyopathy  ([place])   . CAD (coronary artery disease)     70% distal circumflex 11/2014    Past Surgical History  Procedure Laterality Date  . Appendectomy    . Tonsillectomy    . Colonoscopy  2005    Neg  . Left and right heart catheterization with coronary angiogram N/A 12/03/2014    Procedure: LEFT AND RIGHT HEART CATHETERIZATION WITH CORONARY ANGIOGRAM;  Surgeon: Blane Ohara, MD;  Location: [facility] CATH LAB;  Service: Cardiovascular;  Laterality: N/A;  . Colonoscopy N/A 01/05/2015    SLF:17 colon polyps removed/moderate sized hemorrhoids  . 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 Hemorrhoid banding N/A 01/05/2015    Procedure: HEMORRHOID BANDING;  Surgeon: Danie Binder, MD;  Location: [facility] ENDO SUITE;  Service: Endoscopy;  Laterality: N/A;   Family History  Problem Relation Age of Onset  . Heart attack Father   . Hypertension Father   . Diabetes Father   . Colon cancer Neg Hx   . Colon polyps Neg Hx    Social History  Substance Use Topics  . Smoking status: Former Smoker -- 0.25 packs/day for 3 years    Types: Cigars, Cigarettes  Start date: 10/19/1969    Quit date: 10/22/1972  . Smokeless tobacco: Current User    Types: Snuff  . Alcohol Use: No    Review of Systems  Neurological: Positive for dizziness.  All other systems reviewed and are negative.     Allergies  Review of patient's allergies indicates no known allergies.  Home Medications   Prior to Admission medications   Medication Sig Start Date End Date Taking? Authorizing Provider  aspirin EC 81 MG tablet Take 81 mg by mouth daily.   Yes Historical Provider, MD  carvedilol (COREG) 3.125 MG tablet Take 1 tablet (3.125 mg total) by mouth 2 (two) times daily. 05/07/15  Yes Lezlie Octave Black, NP  losartan (COZAAR) 25 MG tablet TAKE 1 TABLET BY MOUTH ONCE DAILY. 10/05/15  Yes Satira Sark, MD  spironolactone  (ALDACTONE) 25 MG tablet Take 1 tablet (25 mg total) by mouth daily. 05/07/15  Yes Lezlie Octave Black, NP  topiramate (TOPAMAX) 50 MG tablet TAKE 1 TABLET BY MOUTH TWICE DAILY. 10/05/15  Yes Kathyrn Drown, MD  calcium carbonate (TUMS - DOSED IN MG ELEMENTAL CALCIUM) 500 MG chewable tablet Chew 1 tablet by mouth as needed for indigestion or heartburn.    Historical Provider, MD  nitroGLYCERIN (NITROSTAT) 0.4 MG SL tablet Place 1 tablet (0.4 mg total) under the tongue every 5 (five) minutes as needed for chest pain. 05/07/15   Radene Gunning, NP  omeprazole (PRILOSEC) 20 MG capsule Take 1 capsule (20 mg total) by mouth daily as needed (acid reflux). 05/07/15   Radene Gunning, NP  rivaroxaban (XARELTO) 20 MG TABS tablet Take 1 tablet (20 mg total) by mouth daily. Patient not taking: Reported on 11/18/2015 05/08/15   Lezlie Octave Black, NP   BP 139/88 mmHg  Pulse 54  Temp(Src) 97.3 F (36.3 C) (Oral)  Resp 13  Ht 6\' 1"  (1.854 m)  Wt 200 lb (90.719 kg)  BMI 26.39 kg/m2  SpO2 100% Physical Exam  Constitutional: He appears well-developed and well-nourished. No distress.  HENT:  Head: Normocephalic and atraumatic.  Mouth/Throat: Oropharynx is clear and moist. No oropharyngeal exudate.  Eyes: Conjunctivae and EOM are normal. Pupils are equal, round, and reactive to light. Right eye exhibits no discharge. Left eye exhibits no discharge. No scleral icterus.  Neck: Normal range of motion. Neck supple. No JVD present. No thyromegaly present.  Cardiovascular: Normal rate, normal heart sounds and intact distal pulses.  Exam reveals no gallop and no friction rub.   No murmur heard. Afib, normal pulses, no murmurs  Pulmonary/Chest: Effort normal and breath sounds normal. No respiratory distress. He has no wheezes. He has no rales.  Abdominal: Soft. Bowel sounds are normal. He exhibits no distension and no mass. There is no tenderness.  Musculoskeletal: Normal range of motion. He exhibits no edema or tenderness.   Lymphadenopathy:    He has no cervical adenopathy.  Neurological: He is alert. Coordination normal.  Skin: Skin is warm and dry. No rash noted. No erythema.  Psychiatric: He has a normal mood and affect. His behavior is normal.  Nursing note and vitals reviewed.   ED Course  Procedures (including critical care time) Labs Review Labs Reviewed  APTT  CBC  PROTIME-INR  BASIC METABOLIC PANEL  TROPONIN I    Imaging Review Ct Head Wo Contrast  11/18/2015  CLINICAL DATA:  Injured head 2 weeks ago. Persistent neck pain, headache and dizziness. EXAM: CT HEAD WITHOUT CONTRAST CT CERVICAL SPINE WITHOUT CONTRAST  TECHNIQUE: Multidetector CT imaging of the head and cervical spine was performed following the standard protocol without intravenous contrast. Multiplanar CT image reconstructions of the cervical spine were also generated. COMPARISON:  Head CT 05/18/2015 FINDINGS: CT HEAD FINDINGS The ventricles are normal in size and configuration. No extra-axial fluid collections are identified. The gray-white differentiation is normal. No CT findings for acute intracranial process such as hemorrhage or infarction. No mass lesions. The brainstem and cerebellum are grossly normal. The bony structures are intact. No fractures identified. There are changes of chronic right half sphenoid sinusitis. Stable mucous retention cyst or polyp in a left sided ethmoid air cell with mild expansion. The other paranasal sinuses are clear. The mastoid air cells and middle ear cavities are clear. The globes are intact. CT CERVICAL SPINE FINDINGS Mild degenerative cervical spondylosis with multilevel disc disease and facet disease. Moderate C1-2 degenerative changes but no significant pannus formation. The facets are normally aligned. No facet or laminar fractures. The spinal canal is generous. No spinal or foraminal stenosis. IMPRESSION: 1. No acute intracranial findings or skull fracture. Stable mild cerebral atrophy. 2.  Chronic right half sphenoid sinusitis and mucous retention cyst or polyp in a left ethmoid air cell. 3. Degenerative cervical spondylosis with multilevel disc disease and facet disease but no cervical spine fracture or spinal canal compromise. Electronically Signed   By: Marijo Sanes M.D.   On: 11/18/2015 15:43   Ct Cervical Spine Wo Contrast  11/18/2015  CLINICAL DATA:  Injured head 2 weeks ago. Persistent neck pain, headache and dizziness. EXAM: CT HEAD WITHOUT CONTRAST CT CERVICAL SPINE WITHOUT CONTRAST TECHNIQUE: Multidetector CT imaging of the head and cervical spine was performed following the standard protocol without intravenous contrast. Multiplanar CT image reconstructions of the cervical spine were also generated. COMPARISON:  Head CT 05/18/2015 FINDINGS: CT HEAD FINDINGS The ventricles are normal in size and configuration. No extra-axial fluid collections are identified. The gray-white differentiation is normal. No CT findings for acute intracranial process such as hemorrhage or infarction. No mass lesions. The brainstem and cerebellum are grossly normal. The bony structures are intact. No fractures identified. There are changes of chronic right half sphenoid sinusitis. Stable mucous retention cyst or polyp in a left sided ethmoid air cell with mild expansion. The other paranasal sinuses are clear. The mastoid air cells and middle ear cavities are clear. The globes are intact. CT CERVICAL SPINE FINDINGS Mild degenerative cervical spondylosis with multilevel disc disease and facet disease. Moderate C1-2 degenerative changes but no significant pannus formation. The facets are normally aligned. No facet or laminar fractures. The spinal canal is generous. No spinal or foraminal stenosis. IMPRESSION: 1. No acute intracranial findings or skull fracture. Stable mild cerebral atrophy. 2. Chronic right half sphenoid sinusitis and mucous retention cyst or polyp in a left ethmoid air cell. 3. Degenerative  cervical spondylosis with multilevel disc disease and facet disease but no cervical spine fracture or spinal canal compromise. Electronically Signed   By: Marijo Sanes M.D.   On: 11/18/2015 15:43   Dg Chest Portable 1 View  11/18/2015  CLINICAL DATA:  64 year old male with right neck pain and dizziness. Initial encounter. EXAM: PORTABLE CHEST 1 VIEW COMPARISON:  05/06/2015 and earlier. FINDINGS: Large body habitus. Portable AP upright view at 1602 hours. Stable cardiomegaly and mediastinal contours. Allowing for portable technique, the lungs are clear. No pneumothorax. IMPRESSION: No acute cardiopulmonary abnormality. Electronically Signed   By: Genevie Ann M.D.   On: 11/18/2015 16:13  I have personally reviewed and evaluated these images and lab results as part of my medical decision-making.   EKG Interpretation   Date/Time:  Wednesday November 18 2015 16:01:26 EST Ventricular Rate:  73 PR Interval:    QRS Duration: 104 QT Interval:  404 QTC Calculation: 445 R Axis:   -4 Text Interpretation:  Atrial fibrillation Nonspecific T wave abnormality  since last tracing no significant change Confirmed by Manar Smalling  MD, [place]  [phone]) on 11/18/2015 4:05:50 PM      EKG Interpretation  Date/Time:  Wednesday November 18 2015 17:55:06 EST Ventricular Rate:  68 PR Interval:    QRS Duration: 105 QT Interval:  397 QTC Calculation: 422 R Axis:   -22 Text Interpretation:  Atrial fibrillation Borderline left axis deviation since last tracing no significant change Confirmed by Marcelus Dubberly  MD, Thompson Mckim ([id number]) on 11/18/2015 6:23:02 PM          MDM   Final diagnoses:  Near syncope    The pt is known to have EF of 25%, he has NICM but also has OM1 obstrution that has thus far been treated medically - he has normal ECG for him with afib and no acute ischemia, VS normal other than mild htn, he is asymptomatic right now.  CT head and C spine without acute findings. Labs pending including trop,  Labs  normal -  Repeat ECG normal No recurrent sx No hypotension Tolerated PO meal Ambulated without diffiuculty No CP and no SOB throughout stay, no recurrent near syncope. Seems stable for d/c - pt in agreement with plan - will f/u close with PCP    Noemi Chapel, MD 11/18/15 [phone]

## 2015-11-18 NOTE — ED Notes (Signed)
Pt states that about 2 weeks ago was walking under a car lift when he struck is forehead on the lift-- Has been having neck pain with certain movements after this incident , Headache started   today with dizziness - Pain is Rt frontal area- Pt states that he has also been having some nausea and blurred vision

## 2015-11-25 ENCOUNTER — Ambulatory Visit (INDEPENDENT_AMBULATORY_CARE_PROVIDER_SITE_OTHER): Payer: Medicare Other | Admitting: Family Medicine

## 2015-11-25 ENCOUNTER — Encounter: Payer: Self-pay | Admitting: Family Medicine

## 2015-11-25 VITALS — BP 118/80 | Ht 71.0 in | Wt 214.0 lb

## 2015-11-25 DIAGNOSIS — M542 Cervicalgia: Secondary | ICD-10-CM | POA: Diagnosis not present

## 2015-11-25 DIAGNOSIS — R42 Dizziness and giddiness: Secondary | ICD-10-CM

## 2015-11-25 DIAGNOSIS — I2510 Atherosclerotic heart disease of native coronary artery without angina pectoris: Secondary | ICD-10-CM

## 2015-11-25 DIAGNOSIS — E785 Hyperlipidemia, unspecified: Secondary | ICD-10-CM | POA: Diagnosis not present

## 2015-11-25 DIAGNOSIS — Z125 Encounter for screening for malignant neoplasm of prostate: Secondary | ICD-10-CM

## 2015-11-25 DIAGNOSIS — M47812 Spondylosis without myelopathy or radiculopathy, cervical region: Secondary | ICD-10-CM | POA: Diagnosis not present

## 2015-11-25 MED ORDER — DICLOFENAC SODIUM 75 MG PO TBEC: 75.0000 mg | DELAYED_RELEASE_TABLET | Freq: Two times a day (BID) | ORAL | Status: DC

## 2015-11-25 MED ORDER — AMOXICILLIN 500 MG PO TABS: 500.0000 mg | ORAL_TABLET | Freq: Three times a day (TID) | ORAL | Status: DC

## 2015-11-25 NOTE — Progress Notes (Signed)
   Subjective:    Patient ID: David Dougherty, male    DOB: [birth year], 64 y.o.   MRN: [id number]  HPIFollow up ER visit. Went to ER for headache, neck pain and dizziness. Still having neck pain on right side and headache. Taking tylenol for the pain.   this patient several weeks ago struck his head with his head backward he has had increased neck pain ever since then he has had neck pain for several months worse recently even for several years if he tries to sit up he often has to support his head to sit up he denies any injuries in the past denies any numbness or tingling into the arms or legs.    on the day the patient went to the ER he felt unsteady and dizzy this lasting for several hours then went away there was no vertigo symptoms no nausea no vomiting no unilateral numbness or weakness. In the ER they did head scans and lab work. They released him. He follows up here today.   Review of Systems  Constitutional: Negative for fever and fatigue.  HENT: Negative for congestion.   Respiratory: Negative for cough and choking.   Cardiovascular: Negative for chest pain.  Gastrointestinal: Negative for abdominal pain.  Neurological: Positive for dizziness and headaches.       Objective:   Physical Exam  Constitutional: He appears well-nourished. No distress.  Cardiovascular: Normal rate, regular rhythm and normal heart sounds.   No murmur heard. Pulmonary/Chest: Effort normal and breath sounds normal. No respiratory distress.  Musculoskeletal: He exhibits no edema.  Lymphadenopathy:    He has no cervical adenopathy.  Neurological: He is alert.  Psychiatric: His behavior is normal.  Vitals reviewed.  Romberg was negative finger to nose normal EOMI. No ataxia. Patient able to ambulate in the hallway without difficulty.  25 minutes was spent with the patient. Greater than half the time was spent in discussion and answering questions and counseling regarding the issues that the patient  came in for today.  patient has history hyperlipidemia also a history of one elevation of the PSA which then came back down patient did not follow through with seeing urology. He does need a repeated PSA and lipid profile this was discussed in detail as well therapy.   I talked with the patient at length how I did not feel that he is had a stroke he states his main concern is his neck pain and he would just like to have some medication to help this. Patient does not want to be on any type of blood thinner medication. Other then his 81 mg aspirin. He is aware that that does not significantly reduce her risk of strokes.    Assessment & Plan:   atrial fibrillation patient stable. Patient refuses anticoagulation medication. Patient will take 81 mg aspirin.   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 Voltaren twice a day for the neck follow-up within 3-4 weeks if it bothers stomach or shows any signs of GI bleeding immediately seek help stop medication   I find no evidence of his chronic heart failure causing the recent dizziness   I find no evidence of a stroke on today's exam.

## 2015-12-01 ENCOUNTER — Other Ambulatory Visit: Payer: Self-pay | Admitting: Cardiology

## 2015-12-01 NOTE — Telephone Encounter (Signed)
REFILL 

## 2015-12-08 DIAGNOSIS — E785 Hyperlipidemia, unspecified: Secondary | ICD-10-CM | POA: Diagnosis not present

## 2015-12-08 DIAGNOSIS — Z125 Encounter for screening for malignant neoplasm of prostate: Secondary | ICD-10-CM | POA: Diagnosis not present

## 2015-12-09 ENCOUNTER — Encounter: Payer: Self-pay | Admitting: Family Medicine

## 2015-12-09 LAB — LIPID PANEL
Chol/HDL Ratio: 5.3 ratio units — ABNORMAL HIGH (ref 0.0–5.0)
Cholesterol, Total: 185 mg/dL (ref 100–199)
HDL: 35 mg/dL — ABNORMAL LOW (ref >39)
LDL Calculated: 118 mg/dL — ABNORMAL HIGH (ref 0–99)
Triglycerides: 160 mg/dL — ABNORMAL HIGH (ref 0–149)
VLDL Cholesterol Cal: 32 mg/dL (ref 5–40)

## 2015-12-09 LAB — PSA: Prostate Specific Ag, Serum: 2.9 ng/mL (ref 0.0–4.0)

## 2015-12-21 ENCOUNTER — Encounter: Payer: Self-pay | Admitting: Gastroenterology

## 2015-12-21 ENCOUNTER — Encounter: Payer: Self-pay | Admitting: Family Medicine

## 2015-12-21 ENCOUNTER — Ambulatory Visit (INDEPENDENT_AMBULATORY_CARE_PROVIDER_SITE_OTHER): Payer: Medicare Other | Admitting: Family Medicine

## 2015-12-21 VITALS — BP 118/80 | Ht 71.0 in | Wt 213.5 lb

## 2015-12-21 DIAGNOSIS — M47812 Spondylosis without myelopathy or radiculopathy, cervical region: Secondary | ICD-10-CM | POA: Diagnosis not present

## 2015-12-21 DIAGNOSIS — I481 Persistent atrial fibrillation: Secondary | ICD-10-CM

## 2015-12-21 DIAGNOSIS — I4819 Other persistent atrial fibrillation: Secondary | ICD-10-CM

## 2015-12-21 MED ORDER — HYDROCODONE-ACETAMINOPHEN 5-325 MG PO TABS: 1.0000 | ORAL_TABLET | Freq: Three times a day (TID) | ORAL | Status: DC | PRN

## 2015-12-21 MED ORDER — NABUMETONE 500 MG PO TABS: 500.0000 mg | ORAL_TABLET | Freq: Two times a day (BID) | ORAL | Status: DC

## 2015-12-21 NOTE — Progress Notes (Signed)
   Subjective:    Patient ID: David Dougherty, male    DOB: 1951/10/17, 65 y.o.   MRN: [id number]  Neck Pain  This is a recurrent problem. The pain is associated with nothing. Pain location: Cervical pain. The symptoms are aggravated by position. Stiffness is present all day. Treatments tried: Voltaren.    patient states current medications are not helping he wants to try something different Patient in today for a recheck of cervical neck pain. Patient states no other concerns this visit.  Review of Systems  Musculoskeletal: Positive for neck pain.   patient denies chest tightness pressure pain shortness of breath denies nausea vomiting     Objective:   Physical Exam   subjective discomfort on the right side of the neck with rotation to the right side in rotation to the left denies radiation down the arms lungs clear heart irregular rate controlled      Assessment & Plan:   cervical spondylosis- this is an arthritic condition we'll try Relafen twice a day. Stop  Diclofenac. If new medication bothers his stomach stop this as well    pain medication- hydrocodone 5 mg/325 one 3 times a day when necessary discomfort caution drowsiness

## 2015-12-28 ENCOUNTER — Other Ambulatory Visit: Payer: Self-pay | Admitting: Cardiology

## 2016-01-06 ENCOUNTER — Other Ambulatory Visit: Payer: Self-pay | Admitting: Gastroenterology

## 2016-01-11 ENCOUNTER — Other Ambulatory Visit: Payer: Self-pay | Admitting: Family Medicine

## 2016-02-08 ENCOUNTER — Other Ambulatory Visit: Payer: Self-pay | Admitting: Family Medicine

## 2016-02-10 ENCOUNTER — Telehealth: Payer: Self-pay | Admitting: Family Medicine

## 2016-02-10 NOTE — Telephone Encounter (Signed)
Pt is requesting the dose on his topiramate (TOPAMAX) 50 MG tablet to be increased. Pt states that the 50 mg is not working.      [facility]

## 2016-02-10 NOTE — Telephone Encounter (Signed)
Consult with Dr Nicki Reaper- this is the highest dose appropriate for that medication.

## 2016-02-10 NOTE — Telephone Encounter (Signed)
Patient is still having neck pain and not seeing a lot of improvement on the relafen 500mg  and was told to call back and let you know-patient has a follow up office visit for recheck neck next month

## 2016-02-10 NOTE — Telephone Encounter (Signed)
Patients spouse called back to correct the medication she was referring to.  She said it is not the topamax, it is the Nabumetone 500 mg.

## 2016-02-10 NOTE — Telephone Encounter (Signed)
Stop Relafen, start meloxicam 15 mg 1 daily, #30, 2 refills. We will discuss at his follow-up if this is helping-it is difficult to totally take away the discomfort because he has fairly advanced arthritic issues-medication can only take the edge off the symptoms it cannot cure the symptoms/disease

## 2016-02-11 MED ORDER — MELOXICAM 15 MG PO TABS: 15.0000 mg | ORAL_TABLET | Freq: Every day | ORAL | Status: DC

## 2016-02-11 NOTE — Telephone Encounter (Signed)
[facility] ( Meloxicam sent into pharmacy)

## 2016-02-11 NOTE — Telephone Encounter (Signed)
Spoke with patient and informed him per Dr.Scott Luking- Stop Relafen, start meloxicam 15 mg 1 daily, #30, 2 refills. We will discuss at his follow-up if this is helping-it is difficult to totally take away the discomfort because he has fairly advanced arthritic issues-medication can only take the edge off the symptoms it cannot cure the symptoms/disease. Patient verbalized understanding.

## 2016-03-21 ENCOUNTER — Ambulatory Visit: Payer: Medicare Other | Admitting: Family Medicine

## 2016-03-28 ENCOUNTER — Other Ambulatory Visit: Payer: Self-pay | Admitting: Cardiology

## 2016-04-09 IMAGING — CR DG CHEST 1V PORT
1 series · 1 of 1 positions shown · non-contrast
Comparison: Portable exam 2889 hours compared to 11/21/2014

CLINICAL DATA: Began having LEFT side chest pain at 0000 hours this
morning, shortness of breath, diaphoresis, history atrial
fibrillation, coronary artery disease, non ischemic cardiomyopathy,
smoking

EXAM:
PORTABLE CHEST - 1 VIEW

[ap portable]
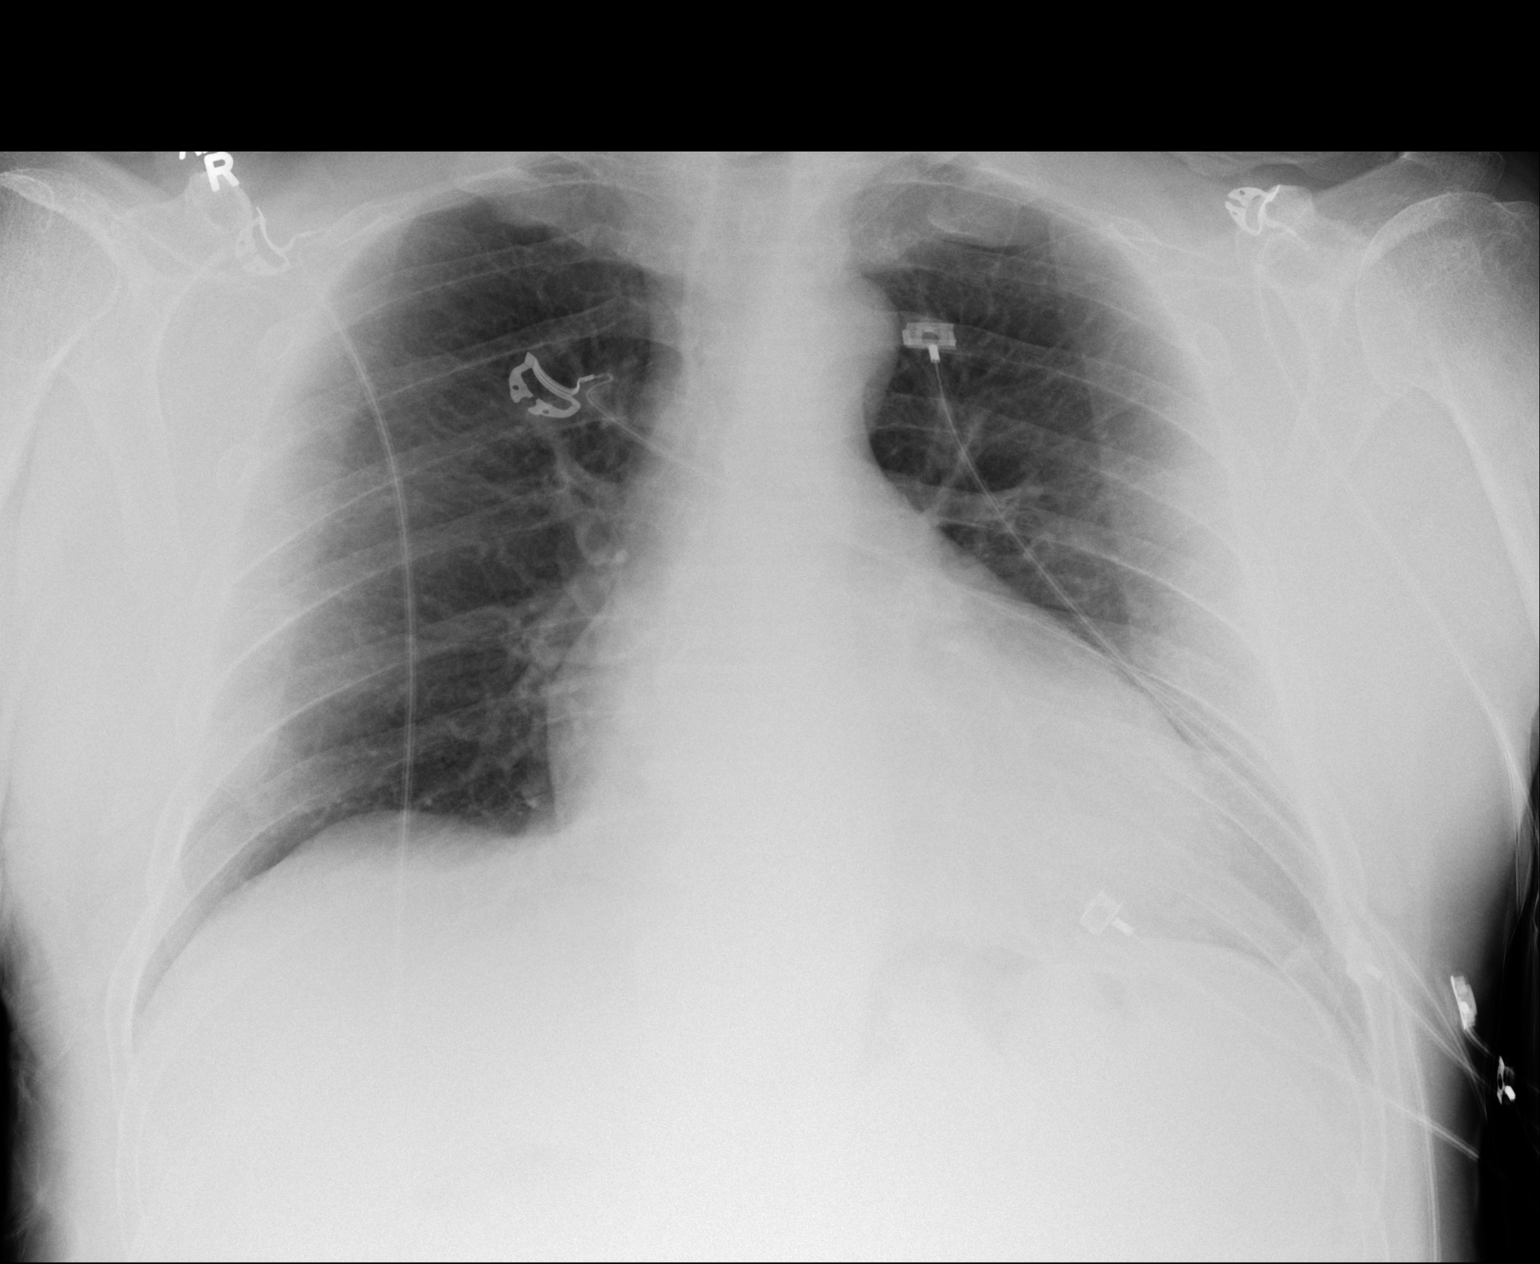

[1 of 1 positions shown; findings below may reference images not displayed]

FINDINGS: Enlargement of cardiac silhouette.

Mediastinal contours and pulmonary vascularity normal.

Lungs clear.

No pleural effusion or pneumothorax.

Bones unremarkable.
IMPRESSION: Enlargement of cardiac silhouette without acute infiltrate.

## 2016-04-21 IMAGING — CT CT HEAD W/O CM
1 series · 16 of 30 positions shown, 20 images · non-contrast
Comparison: None.

CLINICAL DATA: Headache x6 days, on Xarelto

EXAM:
CT HEAD WITHOUT CONTRAST
TECHNIQUE: Contiguous axial images were obtained from the base of the skull
through the vertex without intravenous contrast.

[Series 2: headseq 4.8 h37s · axial · 0.47mm/px · z∈[+136,+272]mm · 16 of 30 slices shown, 20 images]
[im 2/30  brain]
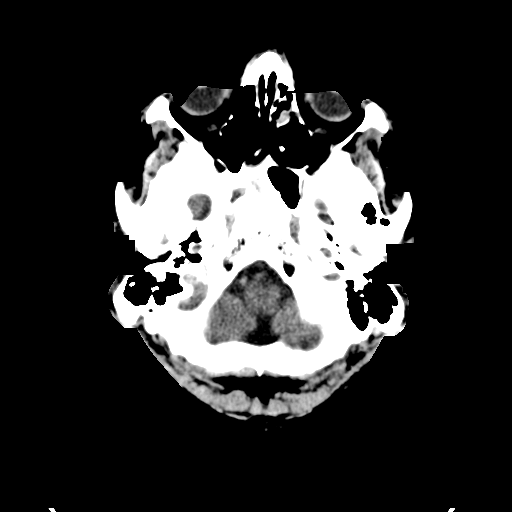
[im 2/30  bone]
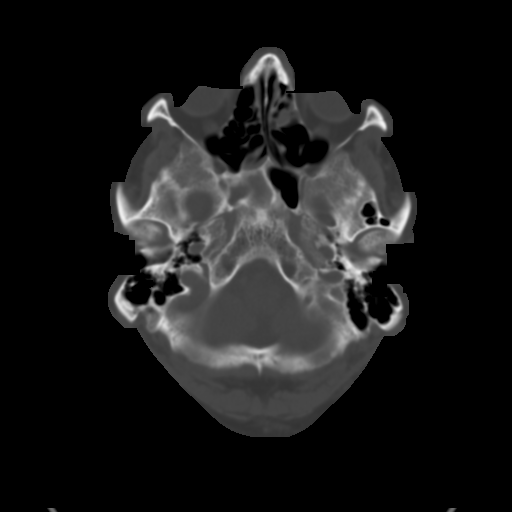
[im 4/30  brain]
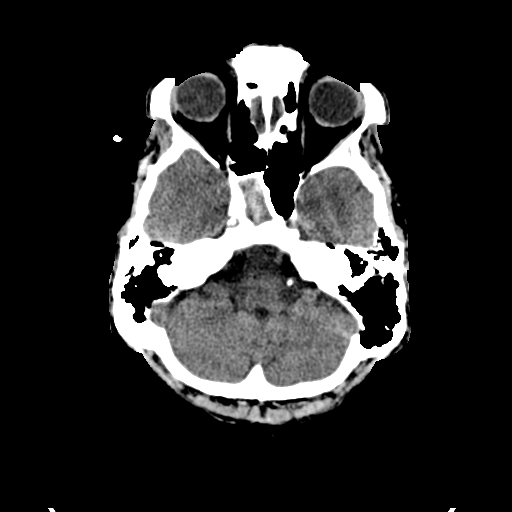
[im 6/30  brain]
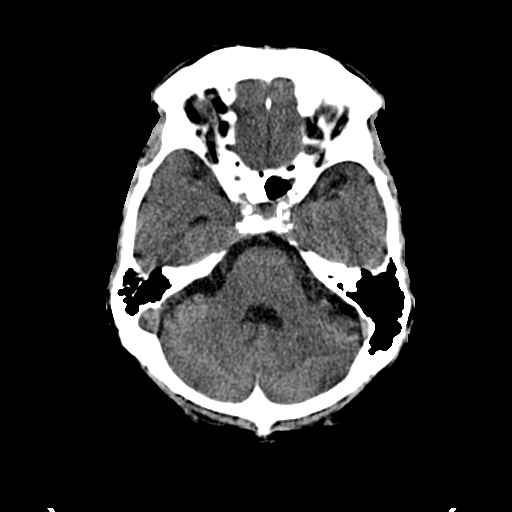
[im 8/30  brain]
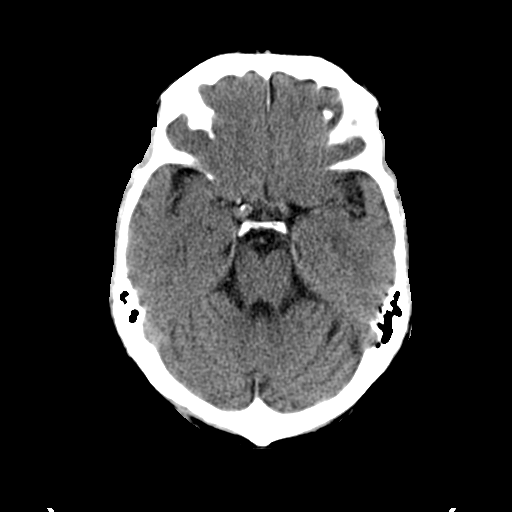
[im 9/30  brain]
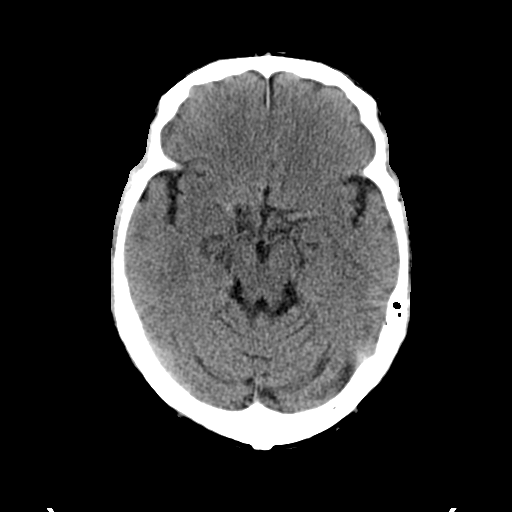
[im 9/30  bone]
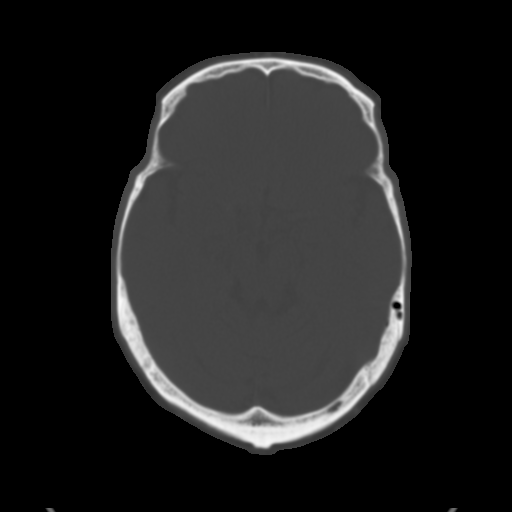
[im 11/30  brain]
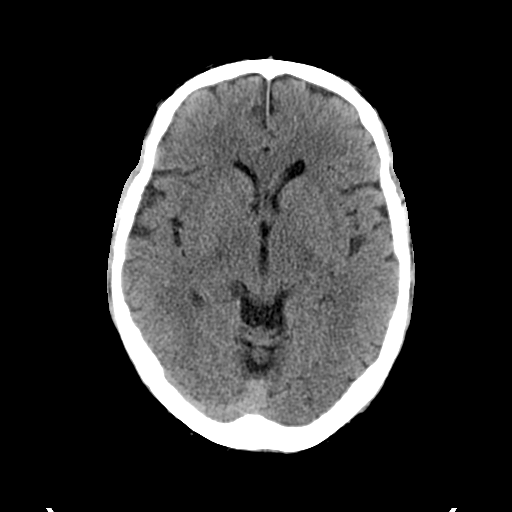
[im 13/30  brain]
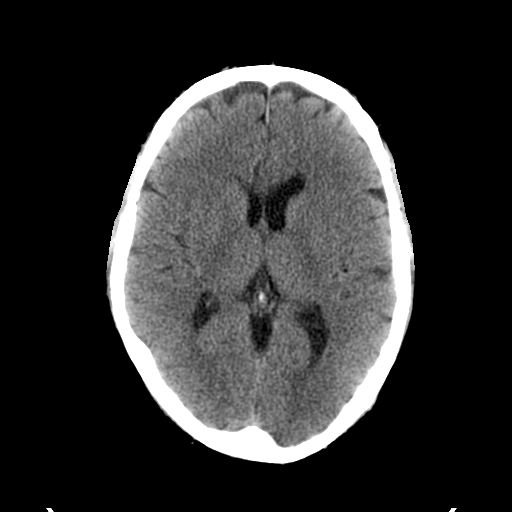
[im 15/30  brain]
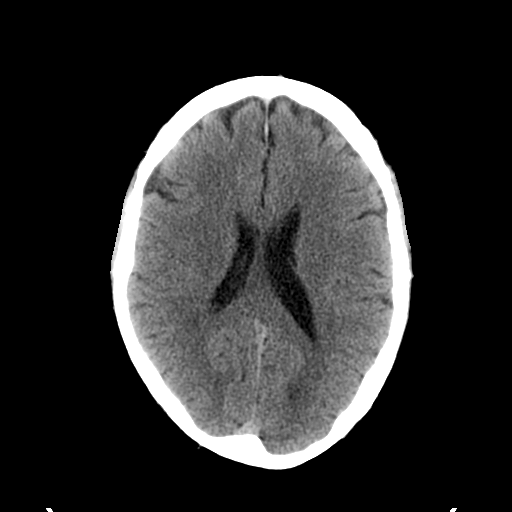
[im 16/30  brain]
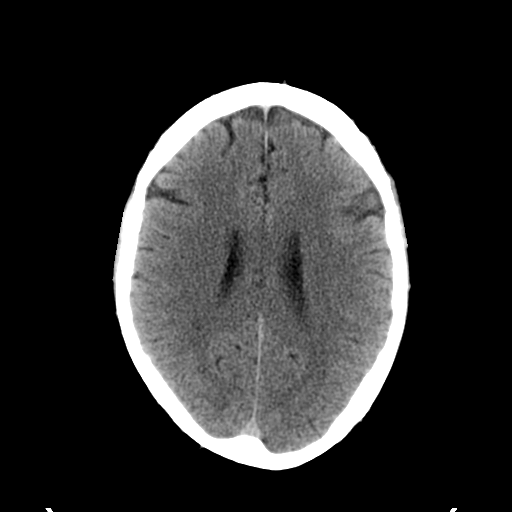
[im 16/30  bone]
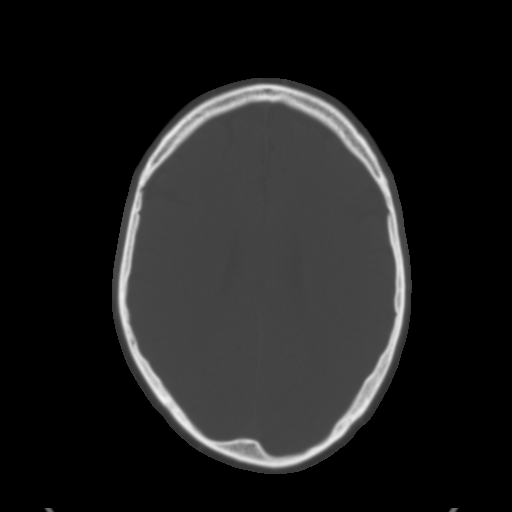
[im 18/30  brain]
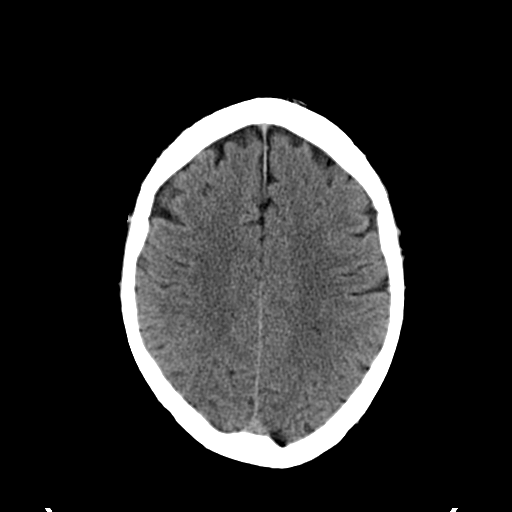
[im 20/30  brain]
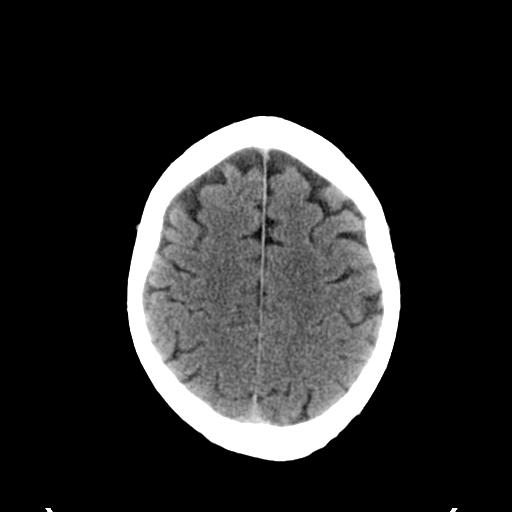
[im 22/30  brain]
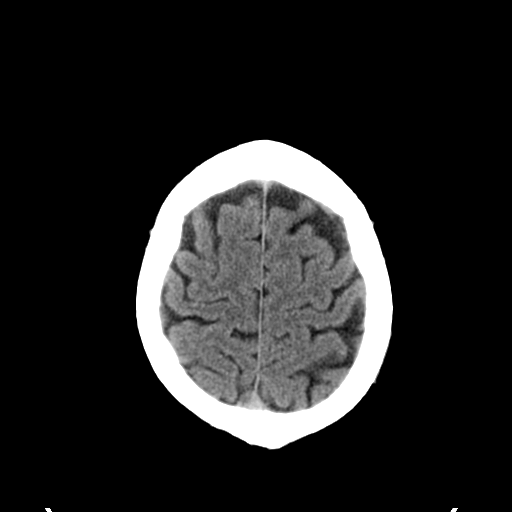
[im 23/30  brain]
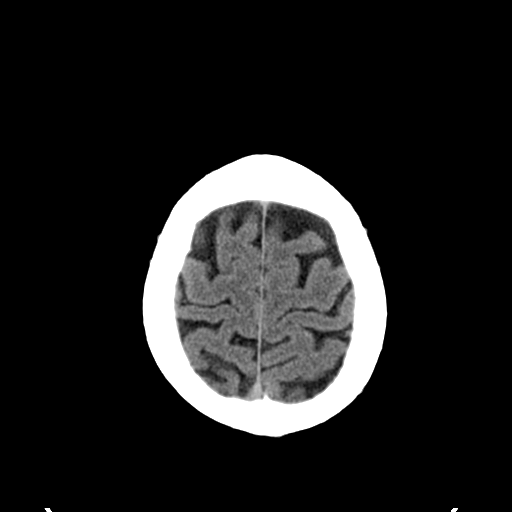
[im 23/30  bone]
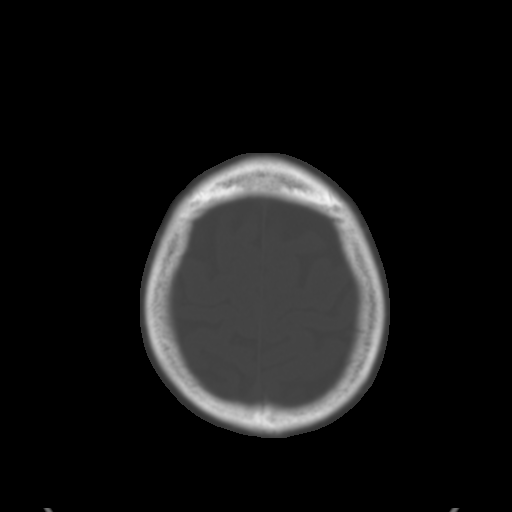
[im 25/30  brain]
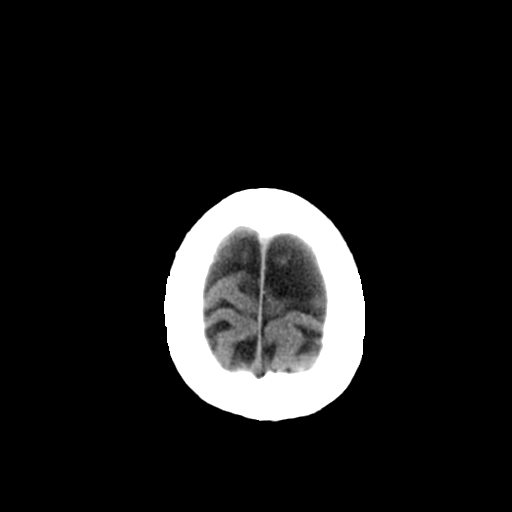
[im 27/30  brain]
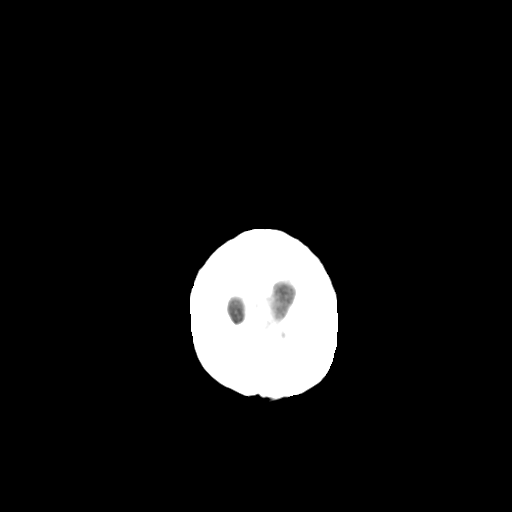
[im 29/30  brain]
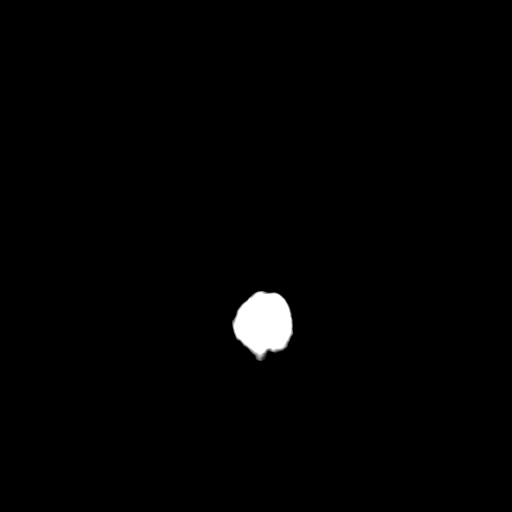

[16 of 30 positions shown; findings below may reference images not displayed]

FINDINGS: No evidence of parenchymal hemorrhage or extra-axial fluid
collection. No mass lesion, mass effect, or midline shift.

No CT evidence of acute infarction.

Cerebral volume is within normal limits.  No ventriculomegaly.

Chronic opacification with expansion of the right sphenoid sinus.
Partial opacification of the left ethmoid air cells. Bilateral
mastoid air cells are clear.

No evidence of calvarial fracture.
IMPRESSION: No evidence of acute intracranial abnormality.

## 2016-05-20 DIAGNOSIS — S92325A Nondisplaced fracture of second metatarsal bone, left foot, initial encounter for closed fracture: Secondary | ICD-10-CM | POA: Diagnosis not present

## 2016-05-20 DIAGNOSIS — S92322A Displaced fracture of second metatarsal bone, left foot, initial encounter for closed fracture: Secondary | ICD-10-CM | POA: Diagnosis not present

## 2016-05-20 DIAGNOSIS — S92592A Other fracture of left lesser toe(s), initial encounter for closed fracture: Secondary | ICD-10-CM | POA: Diagnosis not present

## 2016-05-24 ENCOUNTER — Telehealth: Payer: Self-pay | Admitting: Family Medicine

## 2016-05-24 MED ORDER — CARVEDILOL 3.125 MG PO TABS: 3.1250 mg | ORAL_TABLET | Freq: Two times a day (BID) | ORAL | Status: DC

## 2016-05-24 NOTE — Telephone Encounter (Signed)
carvedilol (COREG) 3.125 MG   Pt states he will not be going back to see Dr Domenic Polite because he  Does not like him. He states you are aware of this and agreed to refill This med when the time came for a refill.   Please send to  [facility]

## 2016-05-24 NOTE — Telephone Encounter (Signed)
May have 3 rf, pt due check with me in July plz have him schedule

## 2016-05-24 NOTE — Telephone Encounter (Signed)
Wife notified refill sent to pharmacy and schedule appointment in July.

## 2016-06-23 ENCOUNTER — Encounter: Payer: Self-pay | Admitting: Family Medicine

## 2016-06-23 ENCOUNTER — Telehealth: Payer: Self-pay | Admitting: Family Medicine

## 2016-06-23 ENCOUNTER — Ambulatory Visit (INDEPENDENT_AMBULATORY_CARE_PROVIDER_SITE_OTHER): Payer: Medicare Other | Admitting: Family Medicine

## 2016-06-23 VITALS — BP 128/78 | Ht 71.0 in | Wt 204.4 lb

## 2016-06-23 DIAGNOSIS — K219 Gastro-esophageal reflux disease without esophagitis: Secondary | ICD-10-CM | POA: Diagnosis not present

## 2016-06-23 DIAGNOSIS — I481 Persistent atrial fibrillation: Secondary | ICD-10-CM

## 2016-06-23 DIAGNOSIS — E785 Hyperlipidemia, unspecified: Secondary | ICD-10-CM

## 2016-06-23 DIAGNOSIS — I429 Cardiomyopathy, unspecified: Secondary | ICD-10-CM | POA: Diagnosis not present

## 2016-06-23 DIAGNOSIS — Z23 Encounter for immunization: Secondary | ICD-10-CM | POA: Diagnosis not present

## 2016-06-23 DIAGNOSIS — I10 Essential (primary) hypertension: Secondary | ICD-10-CM | POA: Diagnosis not present

## 2016-06-23 DIAGNOSIS — I4819 Other persistent atrial fibrillation: Secondary | ICD-10-CM

## 2016-06-23 MED ORDER — LOSARTAN POTASSIUM 25 MG PO TABS: 25.0000 mg | ORAL_TABLET | Freq: Every day | ORAL | Status: DC

## 2016-06-23 NOTE — Telephone Encounter (Signed)
Spoke with patient and asked if he is taking Spironloactone patient stated he is unsure but will have his wife call back because she handles his medications.

## 2016-06-23 NOTE — Telephone Encounter (Signed)
When would you like for me to mail a letter of reminder to this patient for his next appt 3 or 6 months?

## 2016-06-23 NOTE — Telephone Encounter (Signed)
Patients spouse calling to let us know that patient does have the spironolactone (ALDACTONE) 25 MG tablet at home.

## 2016-06-23 NOTE — Progress Notes (Signed)
   Subjective:    Patient ID: David Dougherty, male    DOB: [birth year], 64 y.o.   MRN: [id number]  Hyperlipidemia This is a chronic problem. The current episode started more than 1 year ago.  He does try to watch his diet does not want to be on any medicine He has atrial fibrillation states his rate is been under good control is best he can tell He does not want to be on anticoagulant He understands arthritis strokes He has reflux issues he takes medicine on a daily basis keeps it under control He has cardiomyopathy he does not want to see any specialists. He even states that he doesn't necessarily want to come see [place] but he will Right foot pain for one week. Patient was kneeling and squatting on his feet a lot last week had a lot of pain and discomfort   Review of Systems He denies PND orthopnea dramatic weight changes chest tightness pressure pain or abdominal pain    Objective:   Physical Exam Lungs clear heart irregular no murmurs extremities no edema except for some in the left lower a full no crackles in the lungs normal Some discomfort in the midfoot       Assessment & Plan:  HTN-watch diet continue medication  Cardiomyopathy continue Coreg family will clarify if taking Aldactone were not patient not sure  Persistent atrial fibrillation I recommended one of the newer blood thinners request to prevent strokes patient does not want to take any blood thinners he will only take aspirin but he understands that this does not offer much benefit at all  Reflux patient states when he does not take acid blocker he suffers with reflux he continues with his medicine  Metatarsalgia related to his activity last week should get better on its own no x-rays indicated  Patient not interested in a statin but agrees to watch diet check his lipid profile  Pneumonia vaccine today

## 2016-06-23 NOTE — Telephone Encounter (Signed)
Wife states that patient takes this medication once daily.

## 2016-06-23 NOTE — Telephone Encounter (Signed)
Please talk with his wife Freda Munro the most important question is-is he currently taking this daily or not taking it?

## 2016-06-25 DIAGNOSIS — E785 Hyperlipidemia, unspecified: Secondary | ICD-10-CM | POA: Diagnosis not present

## 2016-06-25 DIAGNOSIS — I10 Essential (primary) hypertension: Secondary | ICD-10-CM | POA: Diagnosis not present

## 2016-06-26 ENCOUNTER — Encounter: Payer: Self-pay | Admitting: Family Medicine

## 2016-06-26 LAB — BASIC METABOLIC PANEL
BUN/Creatinine Ratio: 16 (ref 10–24)
BUN: 13 mg/dL (ref 8–27)
CO2: 20 mmol/L (ref 18–29)
Calcium: 9 mg/dL (ref 8.6–10.2)
Chloride: 102 mmol/L (ref 96–106)
Creatinine, Ser: 0.81 mg/dL (ref 0.76–1.27)
GFR calc Af Amer: 108 mL/min/{1.73_m2} (ref >59)
GFR calc non Af Amer: 93 mL/min/{1.73_m2} (ref >59)
Glucose: 117 mg/dL — ABNORMAL HIGH (ref 65–99)
Potassium: 3.8 mmol/L (ref 3.5–5.2)
Sodium: 140 mmol/L (ref 134–144)

## 2016-06-26 LAB — LIPID PANEL
Chol/HDL Ratio: 3.9 ratio units (ref 0.0–5.0)
Cholesterol, Total: 145 mg/dL (ref 100–199)
HDL: 37 mg/dL — ABNORMAL LOW (ref >39)
LDL Calculated: 93 mg/dL (ref 0–99)
Triglycerides: 75 mg/dL (ref 0–149)
VLDL Cholesterol Cal: 15 mg/dL (ref 5–40)

## 2016-06-28 NOTE — Telephone Encounter (Signed)
Patient may continue this medication 1 daily. Patient does need to keep follow-up visit later this year

## 2016-06-28 NOTE — Telephone Encounter (Signed)
Spoke with patient's wife and informed her per Dr.Scott Luking- continue this medication 1 daily. Keep follow up visit later this year. Patient's wife verbalized understanding.

## 2016-06-29 NOTE — Telephone Encounter (Signed)
The original message was to find out exactly when Dr Nicki Reaper wants the patient back, do I set up a recall for 3 mos or 6 mos? Please advise

## 2016-06-29 NOTE — Telephone Encounter (Signed)
Return in 6 months

## 2016-08-12 DIAGNOSIS — M79672 Pain in left foot: Secondary | ICD-10-CM | POA: Diagnosis not present

## 2016-08-12 DIAGNOSIS — S92309A Fracture of unspecified metatarsal bone(s), unspecified foot, initial encounter for closed fracture: Secondary | ICD-10-CM | POA: Diagnosis not present

## 2016-08-15 ENCOUNTER — Other Ambulatory Visit: Payer: Self-pay | Admitting: Cardiovascular Disease

## 2016-09-03 ENCOUNTER — Other Ambulatory Visit: Payer: Self-pay | Admitting: Family Medicine

## 2016-09-09 DIAGNOSIS — S92335A Nondisplaced fracture of third metatarsal bone, left foot, initial encounter for closed fracture: Secondary | ICD-10-CM | POA: Diagnosis not present

## 2016-09-09 DIAGNOSIS — E559 Vitamin D deficiency, unspecified: Secondary | ICD-10-CM | POA: Diagnosis not present

## 2016-09-09 DIAGNOSIS — S92309A Fracture of unspecified metatarsal bone(s), unspecified foot, initial encounter for closed fracture: Secondary | ICD-10-CM | POA: Diagnosis not present

## 2016-09-09 DIAGNOSIS — M79672 Pain in left foot: Secondary | ICD-10-CM | POA: Diagnosis not present

## 2016-09-10 DIAGNOSIS — S92309A Fracture of unspecified metatarsal bone(s), unspecified foot, initial encounter for closed fracture: Secondary | ICD-10-CM | POA: Diagnosis not present

## 2016-09-22 ENCOUNTER — Telehealth: Payer: Self-pay | Admitting: Family Medicine

## 2016-09-22 ENCOUNTER — Other Ambulatory Visit: Payer: Self-pay | Admitting: *Deleted

## 2016-09-22 DIAGNOSIS — Z79899 Other long term (current) drug therapy: Secondary | ICD-10-CM

## 2016-09-22 MED ORDER — SPIRONOLACTONE 25 MG PO TABS: 25.0000 mg | ORAL_TABLET | Freq: Every day | ORAL | 1 refills | Status: DC

## 2016-09-22 NOTE — Telephone Encounter (Signed)
Discussed with pt. Refills sent to pharm. bw orders mailed to pt to do in 2 -3 weeks.

## 2016-09-22 NOTE — Telephone Encounter (Signed)
Requesting Rx for spironolactone (ALDACTONE) 25 MG tablet.  He is completely out.  [employer]

## 2016-09-22 NOTE — Telephone Encounter (Signed)
Medication last prescribed by a different MD . May we refill

## 2016-09-22 NOTE — Telephone Encounter (Signed)
He may have 30 day supply with 5 refills, also he needs to check metabolic 7 within the next 2-3 weeks to make sure his potassium is stable

## 2016-10-07 DIAGNOSIS — S92309A Fracture of unspecified metatarsal bone(s), unspecified foot, initial encounter for closed fracture: Secondary | ICD-10-CM | POA: Diagnosis not present

## 2016-10-07 DIAGNOSIS — E559 Vitamin D deficiency, unspecified: Secondary | ICD-10-CM | POA: Diagnosis not present

## 2016-10-07 DIAGNOSIS — M79672 Pain in left foot: Secondary | ICD-10-CM | POA: Diagnosis not present

## 2016-10-15 DIAGNOSIS — Z79899 Other long term (current) drug therapy: Secondary | ICD-10-CM | POA: Diagnosis not present

## 2016-10-16 ENCOUNTER — Encounter: Payer: Self-pay | Admitting: Family Medicine

## 2016-10-16 LAB — BASIC METABOLIC PANEL
BUN/Creatinine Ratio: 19 (ref 10–24)
BUN: 18 mg/dL (ref 8–27)
CO2: 23 mmol/L (ref 18–29)
Calcium: 9.3 mg/dL (ref 8.6–10.2)
Chloride: 102 mmol/L (ref 96–106)
Creatinine, Ser: 0.97 mg/dL (ref 0.76–1.27)
GFR calc Af Amer: 94 mL/min/{1.73_m2} (ref >59)
GFR calc non Af Amer: 82 mL/min/{1.73_m2} (ref >59)
Glucose: 108 mg/dL — ABNORMAL HIGH (ref 65–99)
Potassium: 4.4 mmol/L (ref 3.5–5.2)
Sodium: 142 mmol/L (ref 134–144)

## 2016-10-25 ENCOUNTER — Other Ambulatory Visit (HOSPITAL_COMMUNITY): Payer: Self-pay | Admitting: Podiatry

## 2016-10-25 DIAGNOSIS — S92309A Fracture of unspecified metatarsal bone(s), unspecified foot, initial encounter for closed fracture: Secondary | ICD-10-CM

## 2016-10-25 DIAGNOSIS — E559 Vitamin D deficiency, unspecified: Secondary | ICD-10-CM | POA: Diagnosis not present

## 2016-10-25 DIAGNOSIS — Z780 Asymptomatic menopausal state: Secondary | ICD-10-CM

## 2016-10-25 DIAGNOSIS — M79672 Pain in left foot: Secondary | ICD-10-CM | POA: Diagnosis not present

## 2016-11-03 ENCOUNTER — Ambulatory Visit (INDEPENDENT_AMBULATORY_CARE_PROVIDER_SITE_OTHER): Payer: Medicare Other | Admitting: Family Medicine

## 2016-11-03 ENCOUNTER — Encounter: Payer: Self-pay | Admitting: Family Medicine

## 2016-11-03 ENCOUNTER — Ambulatory Visit (HOSPITAL_COMMUNITY)
Admission: RE | Admit: 2016-11-03 | Discharge: 2016-11-03 | Disposition: A | Discharge status: Home / Self Care | Payer: Medicare Other | Source: Ambulatory Visit | Attending: Podiatry | Admitting: Podiatry

## 2016-11-03 VITALS — BP 122/76 | Ht 71.0 in | Wt 215.0 lb

## 2016-11-03 DIAGNOSIS — K219 Gastro-esophageal reflux disease without esophagitis: Secondary | ICD-10-CM | POA: Diagnosis not present

## 2016-11-03 DIAGNOSIS — S92309A Fracture of unspecified metatarsal bone(s), unspecified foot, initial encounter for closed fracture: Secondary | ICD-10-CM

## 2016-11-03 DIAGNOSIS — I10 Essential (primary) hypertension: Secondary | ICD-10-CM | POA: Diagnosis not present

## 2016-11-03 DIAGNOSIS — Z23 Encounter for immunization: Secondary | ICD-10-CM | POA: Diagnosis not present

## 2016-11-03 DIAGNOSIS — E559 Vitamin D deficiency, unspecified: Secondary | ICD-10-CM | POA: Diagnosis not present

## 2016-11-03 DIAGNOSIS — M85852 Other specified disorders of bone density and structure, left thigh: Secondary | ICD-10-CM | POA: Diagnosis not present

## 2016-11-03 DIAGNOSIS — I429 Cardiomyopathy, unspecified: Secondary | ICD-10-CM

## 2016-11-03 DIAGNOSIS — I481 Persistent atrial fibrillation: Secondary | ICD-10-CM | POA: Diagnosis not present

## 2016-11-03 DIAGNOSIS — I4819 Other persistent atrial fibrillation: Secondary | ICD-10-CM

## 2016-11-03 DIAGNOSIS — X58XXXA Exposure to other specified factors, initial encounter: Secondary | ICD-10-CM | POA: Diagnosis not present

## 2016-11-03 DIAGNOSIS — M8580 Other specified disorders of bone density and structure, unspecified site: Secondary | ICD-10-CM | POA: Diagnosis not present

## 2016-11-03 NOTE — Progress Notes (Signed)
   Subjective:    Patient ID: David Dougherty, male    DOB: 1951/07/15, 65 y.o.   MRN: [id number]  HPIFollow up foot fracture. Had bone density test today. Brought in copy of vit d level.  This gentleman relates that he had a foot fracture his podiatrist's orthopedist been following it. They did a bone study on him. They also encouraged him to take vitamin D. He is not taking any calcium but he does take in a lot of cheese and some milk.  He also has a history of heart failure he denies any PND orthopnea denies shortness of breath states he's able to work the way he should without trouble  He has atrial fibrillation he's been talked to in the past about the importance of taking a prescription anticoagulant but he does not want to do so  We did discuss flu vaccine today and he relates that he will do at this time   Review of Systems He denies any chest pressure tightness pain shortness breath PND orthopnea vomiting diarrhea denies any bleeding issues    Objective:   Physical Exam Lungs clear no crackles heart irregular rate is controlled blood pressure is good extremities no edema skin warm dry       Assessment & Plan:  Low vitamin D-patient should take 1000 vitamin D daily he on her recheck this again somewhere in the spring  Chronic heart failure-medications reviewed Amer new  Atrial fibrillation patient does not one to be on any type and a quite:  Flu vaccine today  Patient had not been taking his losartan recently he will restart taking it. He will check a metabolic 7 in approximately 2 weeks.

## 2016-11-04 DIAGNOSIS — S92309A Fracture of unspecified metatarsal bone(s), unspecified foot, initial encounter for closed fracture: Secondary | ICD-10-CM | POA: Diagnosis not present

## 2016-11-04 DIAGNOSIS — M8580 Other specified disorders of bone density and structure, unspecified site: Secondary | ICD-10-CM | POA: Diagnosis not present

## 2016-11-04 DIAGNOSIS — M79672 Pain in left foot: Secondary | ICD-10-CM | POA: Diagnosis not present

## 2016-11-04 DIAGNOSIS — E559 Vitamin D deficiency, unspecified: Secondary | ICD-10-CM | POA: Diagnosis not present

## 2016-11-15 ENCOUNTER — Telehealth: Payer: Self-pay | Admitting: Family Medicine

## 2016-11-15 NOTE — Telephone Encounter (Signed)
Nurse's-patient was just recently seen at the end of November. Certainly we can follow-up on the vitamin D being low but I don't feel he'll need a follow-up office visit anytime soon more than likely a couple months from now after he has done some supplementation. Find out from the patient has he done any vitamin D supplementation? Any prescription supplementation? Is he taking any over-the-counter vitamin D if so the amount? Thank you

## 2016-11-15 NOTE — Telephone Encounter (Signed)
Pt was seen by Dr. Posey Pronto at [facility] Their office discovered patient's low Vitamin D level and recommends follow up here  They would like to be notified when appointment is scheduled  I will call the patient to schedule, just wanted to ask when should I suggest he be seen?  I have their office notes if you'd like to review them  Please advise

## 2016-11-16 NOTE — Telephone Encounter (Signed)
He is taking otc vit d 1,000 iu 2 a day

## 2016-11-16 NOTE — Telephone Encounter (Signed)
Patient should do high-dose vitamin D supplementation for the next month. I recommend 50,000 units vitamin D once weekly for the next 4 weeks, #4, no refills-then resume 1000 units vitamin D taking 2 daily. Patient should follow-up in approximately 8 weeks since we just recently seen him. Please have the patient repeat vitamin D level before follow-up office visit he can do this the week before the exam. Per podiatry request please notify podiatry that the patient has been scheduled

## 2016-11-17 ENCOUNTER — Other Ambulatory Visit: Payer: Self-pay | Admitting: *Deleted

## 2016-11-17 DIAGNOSIS — E559 Vitamin D deficiency, unspecified: Secondary | ICD-10-CM

## 2016-11-17 MED ORDER — VITAMIN D (ERGOCALCIFEROL) 1.25 MG (50000 UNIT) PO CAPS: 50000.0000 [IU] | ORAL_CAPSULE | ORAL | 0 refills | Status: DC

## 2016-11-17 NOTE — Telephone Encounter (Signed)
Discussed with pt. Orders for vit d put in and mailed to pt to do in 8 weeks. Transferred to front to schedule ov in 8 weeks. rx for vit d sent to pharm.

## 2016-12-17 DIAGNOSIS — E559 Vitamin D deficiency, unspecified: Secondary | ICD-10-CM | POA: Diagnosis not present

## 2016-12-19 DIAGNOSIS — E559 Vitamin D deficiency, unspecified: Secondary | ICD-10-CM | POA: Diagnosis not present

## 2016-12-19 DIAGNOSIS — M79672 Pain in left foot: Secondary | ICD-10-CM | POA: Diagnosis not present

## 2016-12-19 DIAGNOSIS — S92309A Fracture of unspecified metatarsal bone(s), unspecified foot, initial encounter for closed fracture: Secondary | ICD-10-CM | POA: Diagnosis not present

## 2016-12-19 LAB — VITAMIN D 25 HYDROXY (VIT D DEFICIENCY, FRACTURES): Vit D, 25-Hydroxy: 28.1 ng/mL — ABNORMAL LOW (ref 30.0–100.0)

## 2016-12-22 ENCOUNTER — Other Ambulatory Visit: Payer: Self-pay | Admitting: Nurse Practitioner

## 2016-12-22 ENCOUNTER — Other Ambulatory Visit: Payer: Self-pay | Admitting: Family Medicine

## 2016-12-22 NOTE — Telephone Encounter (Signed)
Patient needs ov within next six months

## 2016-12-26 ENCOUNTER — Encounter: Payer: Self-pay | Admitting: Gastroenterology

## 2016-12-26 NOTE — Telephone Encounter (Signed)
Please schedule ov.  

## 2016-12-26 NOTE — Telephone Encounter (Signed)
APPT MADE AND LETTER SENT  °

## 2017-01-12 ENCOUNTER — Encounter: Payer: Self-pay | Admitting: Family Medicine

## 2017-01-12 ENCOUNTER — Ambulatory Visit (INDEPENDENT_AMBULATORY_CARE_PROVIDER_SITE_OTHER): Payer: Medicare Other | Admitting: Family Medicine

## 2017-01-12 VITALS — BP 122/88 | Ht 71.0 in | Wt 220.1 lb

## 2017-01-12 DIAGNOSIS — E559 Vitamin D deficiency, unspecified: Secondary | ICD-10-CM | POA: Diagnosis not present

## 2017-01-12 DIAGNOSIS — E784 Other hyperlipidemia: Secondary | ICD-10-CM | POA: Diagnosis not present

## 2017-01-12 DIAGNOSIS — I10 Essential (primary) hypertension: Secondary | ICD-10-CM

## 2017-01-12 DIAGNOSIS — Z125 Encounter for screening for malignant neoplasm of prostate: Secondary | ICD-10-CM

## 2017-01-12 DIAGNOSIS — I481 Persistent atrial fibrillation: Secondary | ICD-10-CM | POA: Diagnosis not present

## 2017-01-12 DIAGNOSIS — M545 Low back pain, unspecified: Secondary | ICD-10-CM

## 2017-01-12 DIAGNOSIS — Z79899 Other long term (current) drug therapy: Secondary | ICD-10-CM | POA: Diagnosis not present

## 2017-01-12 DIAGNOSIS — I429 Cardiomyopathy, unspecified: Secondary | ICD-10-CM | POA: Diagnosis not present

## 2017-01-12 DIAGNOSIS — I4819 Other persistent atrial fibrillation: Secondary | ICD-10-CM

## 2017-01-12 DIAGNOSIS — E7849 Other hyperlipidemia: Secondary | ICD-10-CM

## 2017-01-12 MED ORDER — FUROSEMIDE 20 MG PO TABS: 20.0000 mg | ORAL_TABLET | Freq: Every day | ORAL | 6 refills | Status: DC

## 2017-01-12 MED ORDER — SPIRONOLACTONE 25 MG PO TABS: 25.0000 mg | ORAL_TABLET | Freq: Every day | ORAL | 1 refills | Status: DC

## 2017-01-12 MED ORDER — LOSARTAN POTASSIUM 25 MG PO TABS: 25.0000 mg | ORAL_TABLET | Freq: Every day | ORAL | 6 refills | Status: DC

## 2017-01-12 MED ORDER — CARVEDILOL 3.125 MG PO TABS: 3.1250 mg | ORAL_TABLET | Freq: Two times a day (BID) | ORAL | 5 refills | Status: DC

## 2017-01-12 NOTE — Progress Notes (Signed)
   Subjective:    Patient ID: David Dougherty, male    DOB: July 09, 1951, 66 y.o.   MRN: [id number]  HPI Patient is here today to follow up on his recent Vit D results. Patient is doing well. Patient has no concerns at this time. 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 He quickly   Patient denies any strokelike symptoms denies angina symptoms he tends to eat whatever he once to eat. Review of Systems He denies chest tightness pressure pain shortness shortness of breath. He denies high fever chills sweats.    Objective:   Physical Exam  Heart irregular the rate is controlled abdomen is soft extremities 1-2+ edema no crackles in the lungs lungs are clear neck normal subjective discomfort in the lower back negative straight leg raise      Assessment & Plan:  Atrial fibrillation-a shin does not want to be on anticoagulants will only take aspirin he is been warned about the possibility of a stroke  Blood pressure good control continue current measures avoid excessive salt  Cardiomyopathy swelling in the legs I recommend Lasix 20 mg each morning to keep fluid under control repeat lab work in several weeks  Low back pain may use Tylenol stretching exercises were shown follow-up if problems  Vitamin D low patient feels this is causing a lot of his problems he will increase his supplements 3000 international units  daily were repeat lab work again in several months time  Screening for prostate cancer also

## 2017-02-14 ENCOUNTER — Other Ambulatory Visit: Payer: Self-pay | Admitting: Cardiology

## 2017-02-23 ENCOUNTER — Ambulatory Visit: Payer: Medicare Other | Admitting: Gastroenterology

## 2017-04-06 ENCOUNTER — Ambulatory Visit: Payer: Medicare Other | Admitting: Family Medicine

## 2017-05-14 ENCOUNTER — Emergency Department (HOSPITAL_COMMUNITY): Payer: Medicare Other

## 2017-05-14 ENCOUNTER — Encounter (HOSPITAL_COMMUNITY): Payer: Self-pay | Admitting: *Deleted

## 2017-05-14 ENCOUNTER — Emergency Department (HOSPITAL_COMMUNITY)
Admission: EM | Admit: 2017-05-14 | Discharge: 2017-05-15 | Disposition: A | Discharge status: Home / Self Care | Payer: Medicare Other | Attending: Emergency Medicine | Admitting: Emergency Medicine

## 2017-05-14 DIAGNOSIS — I5022 Chronic systolic (congestive) heart failure: Secondary | ICD-10-CM | POA: Insufficient documentation

## 2017-05-14 DIAGNOSIS — I482 Chronic atrial fibrillation, unspecified: Secondary | ICD-10-CM

## 2017-05-14 DIAGNOSIS — I110 Hypertensive heart disease with heart failure: Secondary | ICD-10-CM | POA: Insufficient documentation

## 2017-05-14 DIAGNOSIS — F17290 Nicotine dependence, other tobacco product, uncomplicated: Secondary | ICD-10-CM | POA: Diagnosis not present

## 2017-05-14 DIAGNOSIS — R51 Headache: Secondary | ICD-10-CM | POA: Diagnosis not present

## 2017-05-14 DIAGNOSIS — G44209 Tension-type headache, unspecified, not intractable: Secondary | ICD-10-CM

## 2017-05-14 DIAGNOSIS — I2510 Atherosclerotic heart disease of native coronary artery without angina pectoris: Secondary | ICD-10-CM | POA: Insufficient documentation

## 2017-05-14 DIAGNOSIS — Z79899 Other long term (current) drug therapy: Secondary | ICD-10-CM | POA: Diagnosis not present

## 2017-05-14 DIAGNOSIS — Z7982 Long term (current) use of aspirin: Secondary | ICD-10-CM | POA: Insufficient documentation

## 2017-05-14 DIAGNOSIS — S0990XA Unspecified injury of head, initial encounter: Secondary | ICD-10-CM | POA: Diagnosis not present

## 2017-05-14 MED ORDER — METOCLOPRAMIDE HCL 5 MG/ML IJ SOLN
10.0000 mg | Freq: Once | INTRAMUSCULAR | Status: AC
  Administered 2017-05-14: 10 mg via INTRAVENOUS
  Filled 2017-05-14: qty 2

## 2017-05-14 MED ORDER — DIPHENHYDRAMINE HCL 50 MG/ML IJ SOLN
25.0000 mg | Freq: Once | INTRAMUSCULAR | Status: AC
  Administered 2017-05-14: 25 mg via INTRAVENOUS
  Filled 2017-05-14: qty 1

## 2017-05-14 MED ORDER — SODIUM CHLORIDE 0.9 % IV BOLUS (SEPSIS)
1000.0000 mL | Freq: Once | INTRAVENOUS | Status: AC
  Administered 2017-05-14: 1000 mL via INTRAVENOUS

## 2017-05-14 NOTE — ED Notes (Signed)
Pt states headache on & off for the past month. Pain medications not helping now. Pt says he hit his head on a lift a month ago that started his headache & neck pain.

## 2017-05-14 NOTE — ED Triage Notes (Signed)
Pt c/o headache; pt states he hit his head x 1 month ago and states he has had a continuous headache that continues to get worse

## 2017-05-14 NOTE — ED Provider Notes (Addendum)
[facility] DEPT Provider Note   CSN: [id number] Arrival date & time: 05/14/17  2116     History   Chief Complaint Chief Complaint  Patient presents with  . Headache    HPI David Dougherty is a 66 y.o. male.  The history is provided by the patient.  Headache    He complains of a bitemporal headache for the last month. Headache started after he hit his head on a beam when he stood up. He has had problems with headaches following minor head trauma in the past. Pain is throbbing and he rates at 10/10. It had been improving with acetaminophen until tonight. He has been able to sleep in spite of the headache and it has not woken him from sleep. Headache is worse with light and noise and with turning his head suddenly. He denies visual change, nausea, vomiting, dizziness, weakness, numbness. He does have history of atrial fibrillation, it is not on any anticoagulants other than aspirin.  Past Medical History:  Diagnosis Date  . Atrial fibrillation ([place])   . CAD (coronary artery disease)    70% distal circumflex 11/2014   . GERD (gastroesophageal reflux disease)   . Nonischemic cardiomyopathy ([place])   . Tobacco abuse    Snuff    Patient Active Problem List   Diagnosis Date Noted  . Vitamin D deficiency 01/12/2017  . Osteopenia 11/03/2016  . Cervical pain 11/25/2015  . Cervical spondylosis without myelopathy 11/25/2015  . Hemorrhoids 06/02/2015  . Loose stools 06/02/2015  . Chest pain 05/06/2015  . H/O cardiomyopathy 05/06/2015  . Essential hypertension 05/06/2015  . Chronic systolic congestive heart failure ([place]) 05/06/2015  . Barrett's esophagus   . CAD (coronary artery disease), native coronary artery 12/22/2014  . Rectal bleeding 12/18/2014  . 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 Elevated PSA 11/20/2014  . Hyperlipidemia 11/20/2014  . Elevated fasting glucose 11/20/2014  . Atrial fibrillation ([place]) 08/12/2010  . GASTROESOPHAGEAL REFLUX DISEASE 08/12/2010     Past Surgical History:  Procedure Laterality Date  . APPENDECTOMY    . COLONOSCOPY  2005   Neg  . COLONOSCOPY N/A 01/05/2015   SLF:17 colon polyps removed/moderate sized hemorrhoids  . ESOPHAGOGASTRODUODENOSCOPY N/A 01/05/2015   [id number] at gastro junction/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/small HH/mild erosive gastrtis and duodenitis  . HEMORRHOID BANDING N/A 01/05/2015   Procedure: HEMORRHOID BANDING;  Surgeon: Danie Binder, MD;  Location: [facility];  Service: Endoscopy;  Laterality: N/A;  . LEFT AND RIGHT HEART CATHETERIZATION WITH CORONARY ANGIOGRAM N/A 12/03/2014   Procedure: LEFT AND RIGHT HEART CATHETERIZATION WITH CORONARY ANGIOGRAM;  Surgeon: Blane Ohara, MD;  Location: [facility] CATH LAB;  Service: Cardiovascular;  Laterality: N/A;  . TONSILLECTOMY         Home Medications    Prior to Admission medications   Medication Sig Start Date End Date Taking? Authorizing Provider  acetaminophen (TYLENOL) 500 MG tablet Take 500 mg by mouth every 6 (six) hours as needed for mild pain or headache.   Yes [provider]  aspirin EC 81 MG tablet Take 81 mg by mouth daily.   Yes [provider]  calcium carbonate (TUMS - DOSED IN MG ELEMENTAL CALCIUM) 500 MG chewable tablet Chew 1 tablet by mouth as needed for indigestion or heartburn.   Yes [provider]  carvedilol (COREG) 3.125 MG tablet Take 1 tablet (3.125 mg total) by mouth 2 (two) times daily. 01/12/17  Yes Kathyrn Drown, MD  cholecalciferol (VITAMIN D) 1000 units tablet Take  3,000 Units by mouth daily.   Yes [provider]  furosemide (LASIX) 20 MG tablet Take 1 tablet (20 mg total) by mouth daily. 01/12/17  Yes Kathyrn Drown, MD  losartan (COZAAR) 25 MG tablet Take 1 tablet (25 mg total) by mouth daily. 01/12/17  Yes Luking, Elayne Snare, MD  nitroGLYCERIN (NITROSTAT) 0.4 MG SL tablet PLACE 1 TAB UNDER TONGUE EVERY 5 MIN IF NEEDED FOR CHEST PAIN. MAY USE 3 TIMES.NO RELIEF CALL 911. 02/14/17  Yes  Satira Sark, MD  omeprazole (PRILOSEC) 20 MG capsule TAKE ONE CAPSULE BY MOUTH 30 MINUTES PRIOR TO BREAKFAST. 12/22/16  Yes Mahala Menghini, PA-C  spironolactone (ALDACTONE) 25 MG tablet Take 1 tablet (25 mg total) by mouth daily. 01/12/17  Yes Kathyrn Drown, MD    Family History Family History  Problem Relation Age of Onset  . Heart attack Father   . Hypertension Father   . Diabetes Father   . Colon cancer Neg Hx   . Colon polyps Neg Hx     Social History Social History  Substance Use Topics  . Smoking status: Former Smoker    Packs/day: 0.25    Years: 3.00    Types: Cigars, Cigarettes    Start date: 10/19/1969    Quit date: 10/22/1972  . Smokeless tobacco: Current User    Types: Snuff  . Alcohol use No     Allergies   Patient has no known allergies.   Review of Systems Review of Systems  Neurological: Positive for headaches.  All other systems reviewed and are negative.    Physical Exam Updated Vital Signs BP (!) 150/83 (BP Location: Right Arm)   Pulse (!) 113   Temp 98.1 F (36.7 C) (Oral)   Resp 16   Ht 6\' 1"  (1.854 m)   Wt 93 kg (205 lb)   SpO2 97%   BMI 27.05 kg/m   Physical Exam  Nursing note and vitals reviewed.  66 year old male, resting comfortably and in no acute distress. Vital signs are significant for hypertension and tachycardia. Oxygen saturation is 97%, which is normal. Head is normocephalic and atraumatic. PERRLA, EOMI. Oropharynx is clear. Fundi show no hemorrhage, exudate, or papilledema. There is tenderness to palpation over the temporalis muscle bilaterally, and over the insertion of the paracervical muscles bilaterally. Neck is nontender and supple without adenopathy or JVD. Back is nontender and there is no CVA tenderness. Lungs are clear without rales, wheezes, or rhonchi. Chest is nontender. Heart has an irregularly irregular rhythm without murmur. Abdomen is soft, flat, nontender without masses or hepatosplenomegaly and  peristalsis is normoactive. Extremities have no cyanosis or edema, full range of motion is present. Skin is warm and dry without rash. Neurologic: Mental status is normal, cranial nerves are intact, there are no motor or sensory deficits.  ED Treatments / Results   Radiology Ct Head Wo Contrast  Result Date: 05/15/2017 CLINICAL DATA:  Chronic bitemporal headache. Hit head on beam. Initial encounter. EXAM: CT HEAD WITHOUT CONTRAST TECHNIQUE: Contiguous axial images were obtained from the base of the skull through the vertex without intravenous contrast. COMPARISON:  CT of the head performed 11/18/2015 FINDINGS: Brain: No evidence of acute infarction, hemorrhage, hydrocephalus, extra-axial collection or mass lesion/mass effect. Prominence of the ventricles and sulci reflects mild cortical volume loss. Mild cerebellar atrophy is noted. The posterior fossa, including the cerebellum, brainstem and fourth ventricle, is within normal limits. The third and lateral ventricles, and basal ganglia are unremarkable  in appearance. The cerebral hemispheres are symmetric in appearance, with normal gray-white differentiation. No mass effect or midline shift is seen. Vascular: No hyperdense vessel or unexpected calcification. Skull: There is no evidence of fracture; visualized osseous structures are unremarkable in appearance. Sinuses/Orbits: The visualized portions of the orbits are within normal limits. Inspissated mucus is noted filling the right side of the sphenoid sinus. The remaining paranasal sinuses and mastoid air cells are well-aerated. Other: No significant soft tissue abnormalities are seen. IMPRESSION: 1. No evidence of traumatic intracranial injury or fracture. 2. Mild cortical volume loss noted. 3. Inspissated mucus filling the right side of the sphenoid sinus. Electronically Signed   By: Garald Balding M.D.   On: 05/15/2017 00:28    Procedures Procedures (including critical care time)  Medications  Ordered in ED Medications  sodium chloride 0.9 % bolus 1,000 mL (0 mLs Intravenous Stopped 05/15/17 0127)  metoCLOPramide (REGLAN) injection 10 mg (10 mg Intravenous Given 05/14/17 2346)  diphenhydrAMINE (BENADRYL) injection 25 mg (25 mg Intravenous Given 05/14/17 2343)  ketorolac (TORADOL) 30 MG/ML injection 30 mg (30 mg Intravenous Given 05/15/17 0046)  dexamethasone (DECADRON) injection 10 mg (10 mg Intravenous Given 05/15/17 0048)     Initial Impression / Assessment and Plan / ED Course  I have reviewed the triage vital signs and the nursing notes.  Pertinent imaging results that were available during my care of the patient were reviewed by me and considered in my medical decision making (see chart for details).  Headache for the past month with onset following blunt head trauma. Physical exam suggestive of muscle contraction headache. Old records are reviewed, and he has known atrial fibrillation but had declined anticoagulation. He will be sent for CT of head and given a migraine cocktail.  12:43 AM Head CT is unremarkable. There is been partial relief of his headache with a cocktail of metoclopramide, diphenhydramine, normal saline. Pain is down to 6/10. He will be given a dose of ketorolac and dexamethasone.  1:34 AM He has achieved complete relief of his headache. He is discharged with prescription for metoclopramide and advised to use over-the-counter NSAIDs and/or acetaminophen as needed. Also advised to use topical measures such as ice. If headache persists, consider course of muscle relaxers.  CHA2DS2/VAS Stroke Risk Points      4 >= 2 Points: High Risk  1 - 1.99 Points: Medium Risk  0 Points: Low Risk    The previous score was 3 on 08/05/2016.:  Change:         Details    Note: External data might be a factor in metrics not marked with    Points Metrics   This score determines the patient's risk of having a stroke if the  patient has atrial fibrillation.       1 Has  Congestive Heart Failure:  Yes   1 Has Vascular Disease:  Yes   1 Has Hypertension:  Yes   1 Age:  50   0 Has Diabetes:  No   0 Had Stroke:  No Had TIA:  No Had thromboembolism:  No   0 Male:  No          Final Clinical Impressions(s) / ED Diagnoses   Final diagnoses:  Muscle contraction headache  Chronic atrial fibrillation (HCC)    New Prescriptions New Prescriptions   METOCLOPRAMIDE (REGLAN) 10 MG TABLET    Take 1 tablet (10 mg total) by mouth every 6 (six) hours as needed for nausea (or headache).  Delora Fuel, MD 93/90/30 0923    Delora Fuel, MD 30/07/62 [phone]

## 2017-05-15 MED ORDER — DEXAMETHASONE SODIUM PHOSPHATE 10 MG/ML IJ SOLN
10.0000 mg | Freq: Once | INTRAMUSCULAR | Status: AC
  Administered 2017-05-15: 10 mg via INTRAVENOUS
  Filled 2017-05-15: qty 1

## 2017-05-15 MED ORDER — KETOROLAC TROMETHAMINE 30 MG/ML IJ SOLN
30.0000 mg | Freq: Once | INTRAMUSCULAR | Status: AC
  Administered 2017-05-15: 30 mg via INTRAVENOUS
  Filled 2017-05-15: qty 1

## 2017-05-15 MED ORDER — METOCLOPRAMIDE HCL 10 MG PO TABS: 10.0000 mg | ORAL_TABLET | Freq: Four times a day (QID) | ORAL | 0 refills | Status: DC | PRN

## 2017-05-15 NOTE — Discharge Instructions (Signed)
You may take over-the-counter naproxen or ibuprofen as needed for the headache. You may also take over-the-counter acetaminophen. Another thing that can be helpful is to apply ice to your temples or to the back of her neck. If the headache persists, consider talking with your doctor about taking a muscle relaxer.

## 2017-05-15 NOTE — ED Notes (Signed)
Pt alert & oriented x4, stable gait. Patient  given discharge instructions, paperwork & prescription(s). Patient verbalized understanding. Pt left department w/ no further questions. 

## 2017-05-31 ENCOUNTER — Ambulatory Visit: Payer: Medicare Other | Admitting: Family Medicine

## 2017-09-13 ENCOUNTER — Other Ambulatory Visit: Payer: Self-pay | Admitting: Family Medicine

## 2017-12-06 ENCOUNTER — Other Ambulatory Visit: Payer: Self-pay | Admitting: Family Medicine

## 2017-12-06 ENCOUNTER — Encounter: Payer: Self-pay | Admitting: Gastroenterology

## 2017-12-08 ENCOUNTER — Other Ambulatory Visit: Payer: Self-pay | Admitting: Family Medicine

## 2017-12-13 NOTE — Telephone Encounter (Signed)
Please deny this patient needs office visit

## 2017-12-14 ENCOUNTER — Ambulatory Visit (INDEPENDENT_AMBULATORY_CARE_PROVIDER_SITE_OTHER): Payer: Medicare Other | Admitting: Family Medicine

## 2017-12-14 ENCOUNTER — Encounter: Payer: Self-pay | Admitting: Family Medicine

## 2017-12-14 VITALS — BP 138/90 | Ht 73.0 in | Wt 212.0 lb

## 2017-12-14 DIAGNOSIS — M542 Cervicalgia: Secondary | ICD-10-CM

## 2017-12-14 DIAGNOSIS — R519 Headache, unspecified: Secondary | ICD-10-CM

## 2017-12-14 DIAGNOSIS — I10 Essential (primary) hypertension: Secondary | ICD-10-CM

## 2017-12-14 DIAGNOSIS — L989 Disorder of the skin and subcutaneous tissue, unspecified: Secondary | ICD-10-CM

## 2017-12-14 DIAGNOSIS — R51 Headache: Secondary | ICD-10-CM

## 2017-12-14 DIAGNOSIS — Z8679 Personal history of other diseases of the circulatory system: Secondary | ICD-10-CM | POA: Diagnosis not present

## 2017-12-14 MED ORDER — OMEPRAZOLE 20 MG PO CPDR: DELAYED_RELEASE_CAPSULE | ORAL | 5 refills | Status: DC

## 2017-12-14 MED ORDER — CARVEDILOL 3.125 MG PO TABS
3.1250 mg | ORAL_TABLET | Freq: Two times a day (BID) | ORAL | 5 refills | Status: DC
Start: 2017-12-14 — End: 2018-12-03

## 2017-12-14 MED ORDER — HYDROCODONE-ACETAMINOPHEN 5-325 MG PO TABS: 1.0000 | ORAL_TABLET | ORAL | 0 refills | Status: DC | PRN

## 2017-12-14 MED ORDER — TOPIRAMATE 50 MG PO TABS: 50.0000 mg | ORAL_TABLET | Freq: Two times a day (BID) | ORAL | 5 refills | Status: DC

## 2017-12-14 MED ORDER — LOSARTAN POTASSIUM 25 MG PO TABS: 25.0000 mg | ORAL_TABLET | Freq: Every day | ORAL | 5 refills | Status: DC

## 2017-12-14 MED ORDER — FUROSEMIDE 20 MG PO TABS: 20.0000 mg | ORAL_TABLET | Freq: Every day | ORAL | 5 refills | Status: DC

## 2017-12-14 NOTE — Progress Notes (Addendum)
   Subjective:    Patient ID: David Dougherty, male    DOB: June 18, 1951, 67 y.o.   MRN: [id number]  Hypertension  This is a chronic problem. The current episode started more than 1 year ago. Pertinent negatives include no chest pain, headaches or shortness of breath. There are no compliance problems (takes meds every day, eats healthy, exercises).    Concerns about headaches. Started several weeks ago. Tried tylenol.  Patient relates intermittent headaches.  Relates no wheezing or difficulty breathing no sweats chills nausea or vomiting. Patient does have history of cardiomyopathy blood pressure issues for which he does take his medicine watch his diet patient does not like the idea of any medical testing Review of Systems  Constitutional: Negative for activity change, fatigue and fever.  Respiratory: Negative for cough and shortness of breath.   Cardiovascular: Negative for chest pain and leg swelling.  Neurological: Negative for headaches.   25 minutes was spent with the patient. Greater than half the time was spent in discussion and answering questions and counseling regarding the issues that the patient came in for today.     Objective:   Physical Exam  Constitutional: He appears well-nourished. No distress.  Cardiovascular: Normal rate and normal heart sounds.  No murmur heard. Pulmonary/Chest: Effort normal and breath sounds normal. No respiratory distress.  Musculoskeletal: He exhibits no edema.  Lymphadenopathy:    He has no cervical adenopathy.  Neurological: He is alert.  Psychiatric: His behavior is normal.  Vitals reviewed.  Skin lesion on the left side of his face concerning for the possibility of a dermatitis versus even the possibility of a small skin cancer has darkened areas consistent with possibility of melanoma       Assessment & Plan:  1. Essential hypertension Blood pressure overall decent control watching diet to some degree stays active but no  purposeful exercise patient does not want any type of anticoagulant does not want to have any advanced testing  2. Frequent headaches Frequent headaches patient does not wake up with these.  He thinks it is coming from his neck for which he has arthritis.  Hydrocodone may be used sparingly otherwise if headaches start causing nausea vomiting blurred vision or wakes him up at night will need advanced imaging  3. Cervical pain More than likely arthritis in his neck Tylenol as needed follow-up if problems  4. H/O cardiomyopathy Has a history of severe cardiomyopathy does not want to see cardiology does not want any advanced interventions does not want to be on any anticoagulants  5. Skin lesion Patient does have a skin lesion on the left side of his face but does not want to have any biopsies but is willing to see dermatology - Ambulatory referral to Dermatology  With his frequent headaches I also recommend a sedimentation rate to be added to his lab work to rule out possibility of temporal arteritis although I feel the likelihood of that is low  In the past patient took Topamax to help his headaches we went ahead and gave him a prescription of this we also gave him a short prescription of hydrocodone for severe pain not for frequent use for home use only

## 2017-12-15 NOTE — Progress Notes (Signed)
Blood work ordered in Epic. Patient notified and verbalized understanding. ?

## 2017-12-15 NOTE — Addendum Note (Signed)
Addended by: Dairl Ponder on: 12/15/2017 09:29 AM   Modules accepted: Orders

## 2017-12-16 DIAGNOSIS — Z125 Encounter for screening for malignant neoplasm of prostate: Secondary | ICD-10-CM | POA: Diagnosis not present

## 2017-12-16 DIAGNOSIS — M542 Cervicalgia: Secondary | ICD-10-CM | POA: Diagnosis not present

## 2017-12-16 DIAGNOSIS — E7849 Other hyperlipidemia: Secondary | ICD-10-CM | POA: Diagnosis not present

## 2017-12-16 DIAGNOSIS — Z79899 Other long term (current) drug therapy: Secondary | ICD-10-CM | POA: Diagnosis not present

## 2017-12-16 DIAGNOSIS — R51 Headache: Secondary | ICD-10-CM | POA: Diagnosis not present

## 2017-12-16 DIAGNOSIS — E559 Vitamin D deficiency, unspecified: Secondary | ICD-10-CM | POA: Diagnosis not present

## 2017-12-17 LAB — SEDIMENTATION RATE: Sed Rate: 45 mm/hr — ABNORMAL HIGH (ref 0–30)

## 2017-12-18 LAB — VITAMIN D 25 HYDROXY (VIT D DEFICIENCY, FRACTURES): Vit D, 25-Hydroxy: 25.3 ng/mL — ABNORMAL LOW (ref 30.0–100.0)

## 2017-12-18 LAB — BASIC METABOLIC PANEL
BUN/Creatinine Ratio: 15 (ref 10–24)
BUN: 17 mg/dL (ref 8–27)
CO2: 20 mmol/L (ref 20–29)
Calcium: 9.4 mg/dL (ref 8.6–10.2)
Chloride: 107 mmol/L — ABNORMAL HIGH (ref 96–106)
Creatinine, Ser: 1.1 mg/dL (ref 0.76–1.27)
GFR calc Af Amer: 80 mL/min/{1.73_m2} (ref >59)
GFR calc non Af Amer: 69 mL/min/{1.73_m2} (ref >59)
Glucose: 119 mg/dL — ABNORMAL HIGH (ref 65–99)
Potassium: 4.7 mmol/L (ref 3.5–5.2)
Sodium: 140 mmol/L (ref 134–144)

## 2017-12-18 LAB — LIPID PANEL
Chol/HDL Ratio: 4.8 ratio (ref 0.0–5.0)
Cholesterol, Total: 168 mg/dL (ref 100–199)
HDL: 35 mg/dL — ABNORMAL LOW (ref >39)
LDL Calculated: 111 mg/dL — ABNORMAL HIGH (ref 0–99)
Triglycerides: 108 mg/dL (ref 0–149)
VLDL Cholesterol Cal: 22 mg/dL (ref 5–40)

## 2017-12-18 LAB — HEPATIC FUNCTION PANEL
ALT: 21 IU/L (ref 0–44)
AST: 16 IU/L (ref 0–40)
Albumin: 4.5 g/dL (ref 3.6–4.8)
Alkaline Phosphatase: 72 IU/L (ref 39–117)
Bilirubin Total: 0.3 mg/dL (ref 0.0–1.2)
Bilirubin, Direct: 0.11 mg/dL (ref 0.00–0.40)
Total Protein: 7.2 g/dL (ref 6.0–8.5)

## 2017-12-18 LAB — PSA: Prostate Specific Ag, Serum: 3.6 ng/mL (ref 0.0–4.0)

## 2017-12-21 ENCOUNTER — Encounter: Payer: Self-pay | Admitting: Family Medicine

## 2018-02-01 ENCOUNTER — Telehealth: Payer: Self-pay | Admitting: Gastroenterology

## 2018-02-01 NOTE — Telephone Encounter (Signed)
TRIAGE FOR EGD for BARRETT'S surveillance AND colonoscopy FOR PERSONAL HISTORY OF POLYPS in 2-3 MOS.

## 2018-02-01 NOTE — Telephone Encounter (Signed)
Per camille. Patient needs OV. LMTCB to schedule OV

## 2018-02-01 NOTE — Progress Notes (Signed)
REVIEWED-NO ADDITIONAL RECOMMENDATIONS. 

## 2018-02-02 ENCOUNTER — Encounter: Payer: Self-pay | Admitting: Gastroenterology

## 2018-02-02 NOTE — Telephone Encounter (Signed)
OV made for 5/2 at 10 with EG and letter mailed

## 2018-02-08 ENCOUNTER — Ambulatory Visit (INDEPENDENT_AMBULATORY_CARE_PROVIDER_SITE_OTHER): Payer: Medicare Other | Admitting: Family Medicine

## 2018-02-08 ENCOUNTER — Encounter: Payer: Self-pay | Admitting: Family Medicine

## 2018-02-08 VITALS — BP 138/86 | Temp 98.0°F | Ht 73.0 in | Wt 205.4 lb

## 2018-02-08 DIAGNOSIS — J310 Chronic rhinitis: Secondary | ICD-10-CM

## 2018-02-08 DIAGNOSIS — J329 Chronic sinusitis, unspecified: Secondary | ICD-10-CM | POA: Diagnosis not present

## 2018-02-08 MED ORDER — AMOXICILLIN-POT CLAVULANATE 875-125 MG PO TABS: 1.0000 | ORAL_TABLET | Freq: Two times a day (BID) | ORAL | 0 refills | Status: AC

## 2018-02-08 NOTE — Progress Notes (Signed)
   Subjective:    Patient ID: David Dougherty, male    DOB: 1950/12/24, 67 y.o.   MRN: [id number]   1   Cough  This is a new problem. The current episode started 1 to 4 weeks ago. Associated symptoms include headaches, rhinorrhea, a sore throat and wheezing. Treatments tried: Nyquil, Dayquil,Tylenol.  Pt states his cough got better; then it came back.   Pt started by ctching a cold, wondered if it ws pneum  Now head is hurting eyes watering  Left side draining significantly  Right side somewhat different  First round occurred with flu like symtom 12 days ago, used symto care      Pt worried  About it, wondering if it is from spinal fluid, worried about because of googling and reading about it     Review of Systems  HENT: Positive for rhinorrhea and sore throat.   Respiratory: Positive for cough and wheezing.   Neurological: Positive for headaches.       Objective:   Physical Exam  Alert, mild malaise. Hydration good Vitals stable. frontal/ maxillary tenderness evident positive nasal congestion. pharynx normal neck supple  lungs clear/no crackles or wheezes. heart regular in rhythm       Assessment & Plan:  Impression rhinosinusitis likely post viral, discussed with patient. plan antibiotics prescribed. Questions answered. Symptomatic care discussed. warning signs discussed. WSL Had an illness it sounds very much like flu 10 days ago.  Now with secondary sinusitis.

## 2018-02-28 DIAGNOSIS — M205X1 Other deformities of toe(s) (acquired), right foot: Secondary | ICD-10-CM | POA: Diagnosis not present

## 2018-02-28 DIAGNOSIS — L851 Acquired keratosis [keratoderma] palmaris et plantaris: Secondary | ICD-10-CM | POA: Diagnosis not present

## 2018-02-28 DIAGNOSIS — M79674 Pain in right toe(s): Secondary | ICD-10-CM | POA: Diagnosis not present

## 2018-04-05 ENCOUNTER — Ambulatory Visit (INDEPENDENT_AMBULATORY_CARE_PROVIDER_SITE_OTHER): Payer: Medicare Other | Admitting: Nurse Practitioner

## 2018-04-05 ENCOUNTER — Telehealth: Payer: Self-pay

## 2018-04-05 ENCOUNTER — Other Ambulatory Visit: Payer: Self-pay

## 2018-04-05 ENCOUNTER — Encounter: Payer: Self-pay | Admitting: Nurse Practitioner

## 2018-04-05 VITALS — BP 124/80 | HR 91 | Temp 97.2°F | Ht 73.0 in | Wt 209.4 lb

## 2018-04-05 DIAGNOSIS — Z860100 Personal history of colon polyps, unspecified: Secondary | ICD-10-CM

## 2018-04-05 DIAGNOSIS — K625 Hemorrhage of anus and rectum: Secondary | ICD-10-CM

## 2018-04-05 DIAGNOSIS — K2270 Barrett's esophagus without dysplasia: Secondary | ICD-10-CM

## 2018-04-05 MED ORDER — NA SULFATE-K SULFATE-MG SULF 17.5-3.13-1.6 GM/177ML PO SOLN: 1.0000 | ORAL | 0 refills | Status: DC

## 2018-04-05 NOTE — Assessment & Plan Note (Signed)
Patient notes scant toilet tissue hematochezia recently.  Feels he has "something that protrudes."  He was previously banded for internal hemorrhoids.  Likely scant toilet tissue hematochezia in the setting of hemorrhoids with benign anorectal source.  Cannot rule out more insidious pathology such as bleeding polyp, especially given his history of 17 polyps removed at his last colonoscopy.  Colonoscopy or allow better evaluation.  Consider banding if needed.  Follow-up in 3 months.

## 2018-04-05 NOTE — Progress Notes (Signed)
cc'ed to pcp °

## 2018-04-05 NOTE — Assessment & Plan Note (Signed)
History of Barrett's esophagus, currently due for repeat EGD.  We will proceed as previously recommended.  We will plan for propofol due to pain medication.  Follow-up in 3 months.  Proceed with EGD with Dr. Oneida Alar in near future: the risks, benefits, and alternatives have been discussed with the patient in detail. The patient states understanding and desires to proceed.  The patient is currently on hydrocodone.  No other anticoagulants, anxiolytics, chronic pain medications, or antidepressants.  We will plan for the procedure on propofol/MAC to promote adequate sedation.

## 2018-04-05 NOTE — Telephone Encounter (Signed)
Tried to call pt's wife to inform of pre-op appt 06/12/18 at 10:00am, no answer, LMOVM. Letter mailed.

## 2018-04-05 NOTE — Assessment & Plan Note (Signed)
History of colon polyps.  Specifically his last colonoscopy he had approximately 17 polyps removed which were found to be tubular adenoma.  Recommended 3-year repeat exam.  He is currently due.  He is currently having some rectal bleeding, likely benign anorectal source.  He is previously had hemorrhoid banding and may need this again.  We will plan for colonoscopy on propofol due to pain medication.  Follow-up in 3 months.  Continue current medications otherwise.  Proceed with TCS +/- hemorrhoid banding with Dr. Oneida Alar in near future: the risks, benefits, and alternatives have been discussed with the patient in detail. The patient states understanding and desires to proceed.  The patient is currently on hydrocodone.  No other anticoagulants, anxiolytics, chronic pain medications, or antidepressants.  We will plan for the procedure on propofol/MAC to promote adequate sedation.

## 2018-04-05 NOTE — Progress Notes (Signed)
Referring Provider: Kathyrn Drown, MD Primary Care Physician:  Kathyrn Drown, MD Primary GI:  Dr. Oneida Alar  Chief Complaint  Patient presents with  . Consult    TCS/EGD-last was 2016  . Gastroesophageal Reflux    had a problem couple weeks ago and thought he was going to vomit  . Rectal Bleeding    "not sure where it is coming from"    HPI:   David Dougherty is a 67 y.o. male who presents to follow-up for scheduling of recommended colonoscopy and EGD.  The patient received a reminder letter 02/01/2018 indicating need for Barrett's surveillance and colonoscopy for personal history of polyps.  Recommended office visit.  The patient has not been seen in our office in 06/02/2015 for GERD, loose stools, hemorrhoids.  Last colonoscopy 01/05/2015 on conscious sedation with 17 noted polyps and recommended 3-year repeat exam.  Endoscopy the same day for Barrett's screening found stricture the EG junction most likely due to GERD, probable Barrett's esophagus.  Biopsies found mild inflammation without H. pylori, goblet cell metaplasia consistent with Barrett's.  Recommended repeat office visit in 1 year to follow-up, which did not occur.  At his last visit, of concern, he noted he stopped taking his PPI because of new stories related to dangers of PPIs and was taking Tums.  He had rebound symptoms and restarted his PPI.  Recommended indefinite use of PPI.  Today he states he's doing ok overall. Has a cold currently, having a lot of coughing and throat irritation. Having a lot of phlegm. Had an episode of dysphagia and subsequent regurgitation which he feels is due to phlegm.  Does have intermittent dysphagia but better after last dilation. Currently occurs about once every 5-6 months. Has some "heartburn" intermittently, occasionally needs TUMS for breakthrough in addition to daily PPI (and this all helps). Has been having some rectal bleeding, this started about several months ago, occurs every several  weeks, bright red. Occurs typically once with a bowel movement. Thinks he may have hemorrhoids. Typically occurs as scant toilet tissue hematochezia. States he had "black tarry stools" a couple weeks ago. Denies abdominal pain, N/V, fever, chills, unintentional weight loss. Has some dyspnea and mild chest pain with frequent coughing (has a cold.) Denies dizziness, lightheadedness, syncope, near syncope. Denies any other upper or lower GI symptoms.  Past Medical History:  Diagnosis Date  . Atrial fibrillation ([place])   . CAD (coronary artery disease)    70% distal circumflex 11/2014   . GERD (gastroesophageal reflux disease)   . Nonischemic cardiomyopathy ([place])   . Tobacco abuse    Snuff    Past Surgical History:  Procedure Laterality Date  . APPENDECTOMY    . COLONOSCOPY  2005   Neg  . COLONOSCOPY N/A 01/05/2015   SLF:17 colon polyps removed/moderate sized hemorrhoids  . ESOPHAGOGASTRODUODENOSCOPY N/A 01/05/2015   GYK:[id number] at gastro junction/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/small HH/mild erosive gastrtis and duodenitis  . HEMORRHOID BANDING N/A 01/05/2015   Procedure: HEMORRHOID BANDING;  Surgeon: Danie Binder, MD;  Location: [facility] SUITE;  Service: Endoscopy;  Laterality: N/A;  . LEFT AND RIGHT HEART CATHETERIZATION WITH CORONARY ANGIOGRAM N/A 12/03/2014   Procedure: LEFT AND RIGHT HEART CATHETERIZATION WITH CORONARY ANGIOGRAM;  Surgeon: Blane Ohara, MD;  Location: [facility] CATH LAB;  Service: Cardiovascular;  Laterality: N/A;  . TONSILLECTOMY      Current Outpatient Medications  Medication Sig Dispense Refill  . acetaminophen (TYLENOL) 500 MG tablet Take 500 mg by  mouth every 6 (six) hours as needed for mild pain or headache.    Marland Kitchen aspirin EC 81 MG tablet Take 81 mg by mouth daily.    . calcium carbonate (TUMS - DOSED IN MG ELEMENTAL CALCIUM) 500 MG chewable tablet Chew 1 tablet by mouth as needed for indigestion or heartburn.    . carvedilol (COREG) 3.125 MG tablet Take 1 tablet (3.125  mg total) by mouth 2 (two) times daily. 60 tablet 5  . furosemide (LASIX) 20 MG tablet Take 1 tablet (20 mg total) by mouth daily. 30 tablet 5  . HYDROcodone-acetaminophen (NORCO/VICODIN) 5-325 MG tablet Take 1 tablet by mouth every 4 (four) hours as needed. 24 tablet 0  . losartan (COZAAR) 25 MG tablet Take 1 tablet (25 mg total) by mouth daily. 30 tablet 5  . nitroGLYCERIN (NITROSTAT) 0.4 MG SL tablet PLACE 1 TAB UNDER TONGUE EVERY 5 MIN IF NEEDED FOR CHEST PAIN. MAY USE 3 TIMES.NO RELIEF CALL 911. 25 tablet 0  . omeprazole (PRILOSEC) 20 MG capsule TAKE ONE CAPSULE BY MOUTH 30 MINUTES PRIOR TO BREAKFAST. 30 capsule 5  . topiramate (TOPAMAX) 50 MG tablet Take 1 tablet (50 mg total) by mouth 2 (two) times daily. 60 tablet 5   No current facility-administered medications for this visit.     Allergies as of 04/05/2018  . (No Known Allergies)    Family History  Problem Relation Age of Onset  . Heart attack Father   . Hypertension Father   . Diabetes Father   . Colon cancer Neg Hx   . Colon polyps Neg Hx     Social History   Socioeconomic History  . Marital status: Married    Spouse name: Not on file  . Number of children: Not on file  . Years of education: Not on file  . Highest education level: Not on file  Occupational History  . Occupation: Camera operator  Social Needs  . Financial resource strain: Not on file  . Food insecurity:    Worry: Not on file    Inability: Not on file  . Transportation needs:    Medical: Not on file    Non-medical: Not on file  Tobacco Use  . Smoking status: Former Smoker    Packs/day: 0.25    Years: 3.00    Pack years: 0.75    Types: Cigars, Cigarettes    Start date: 10/19/1969    Last attempt to quit: 10/22/1972    Years since quitting: 45.4  . Smokeless tobacco: Former Systems developer    Types: Snuff  Substance and Sexual Activity  . Alcohol use: No    Alcohol/week: 0.0 oz  . Drug use: No  . Sexual activity: Not on file    Lifestyle  . Physical activity:    Days per week: Not on file    Minutes per session: Not on file  . Stress: Not on file  Relationships  . Social connections:    Talks on phone: Not on file    Gets together: Not on file    Attends religious service: Not on file    Active member of club or organization: Not on file    Attends meetings of clubs or organizations: Not on file    Relationship status: Not on file  Other Topics Concern  . Not on file  Social History Narrative   Married with 5 children   No regular exercise    Review of Systems: Complete ROS negative except as per  HPI.  Physical Exam: BP 124/80   Pulse 91   Temp (!) 97.2 F (36.2 C) (Oral)   Ht 6\' 1"  (1.854 m)   Wt 209 lb 6.4 oz (95 kg)   BMI 27.63 kg/m  General:   Alert and oriented. Pleasant and cooperative. Well-nourished and well-developed.  Eyes:  Without icterus, sclera clear and conjunctiva pink.  Ears:  Normal auditory acuity. Cardiovascular:  S1, S2 present without murmurs appreciated. Extremities without clubbing or edema. Respiratory:  Clear to auscultation bilaterally. No wheezes, rales, or rhonchi. No distress.  Gastrointestinal:  +BS, soft, non-tender and non-distended. No HSM noted. No guarding or rebound. No masses appreciated.  Rectal:  Deferred  Musculoskalatal:  Symmetrical without gross deformities. Neurologic:  Alert and oriented x4;  grossly normal neurologically. Psych:  Alert and cooperative. Normal mood and affect. Heme/Lymph/Immune: No excessive bruising noted.    04/05/2018 10:19 AM   Disclaimer: This note was dictated with voice recognition software. Similar sounding words can inadvertently be transcribed and may not be corrected upon review.

## 2018-04-05 NOTE — Patient Instructions (Signed)
1. Continue your current medications.  Specifically, continue your acid blocker. 2. We will schedule your colonoscopy with possible banding as well as your an upper endoscopy. 3. Return for follow-up in 3 months. 4. Further recommendations will be made after your procedures. 5. Call us if you have any questions or concerns   At [facility] we value your feedback. You may receive a survey about your visit today. Please share your experience as we strive to create trusting relationships with our patients to provide genuine, compassionate, quality care.  It was great to see you today!  I hope you have a wonderful summer!!

## 2018-06-04 ENCOUNTER — Other Ambulatory Visit: Payer: Self-pay | Admitting: Family Medicine

## 2018-06-06 NOTE — Patient Instructions (Signed)
David Dougherty  06/06/2018     @PREFPERIOPPHARMACY @   Your procedure is scheduled on  06/19/2018 .  Report to [facility] at  615   A.M.  Call this number if you have problems the morning of surgery:  [phone]   Remember:  Do not eat or drink after midnight.  You may drink clear liquids until (follow the instructions given to you) .  Clear liquids allowed are:                    Water, Juice (non-citric and without pulp), Carbonated beverages, Clear Tea, Black Coffee only, Plain Jell-O only, Gatorade and Plain Popsicles only    Take these medicines the morning of surgery with A SIP OF WATER  Coreg, hydrocodone, losartan, topamax.    Do not wear jewelry, make-up or nail polish.  Do not wear lotions, powders, or perfumes, or deodorant.  Do not shave 48 hours prior to surgery.  Men may shave face and neck.  Do not bring valuables to the hospital.  [facility] is not responsible for any belongings or valuables.  Contacts, dentures or bridgework may not be worn into surgery.  Leave your suitcase in the car.  After surgery it may be brought to your room.  For patients admitted to the hospital, discharge time will be determined by your treatment team.  Patients discharged the day of surgery will not be allowed to drive home.   Name and phone number of your driver:   family Special instructions:  Follow the diet and prep instructions given to you by Dr Nona Dell office.  Please read over the following fact sheets that you were given. Anesthesia Post-op Instructions and Care and Recovery After Surgery       Esophagogastroduodenoscopy Esophagogastroduodenoscopy (EGD) is a procedure to examine the lining of the esophagus, stomach, and first part of the small intestine (duodenum). This procedure is done to check for problems such as inflammation, bleeding, ulcers, or growths. During this procedure, a long, flexible, lighted tube with a camera attached  (endoscope) is inserted down the throat. Tell a health care provider about:  Any allergies you have.  All medicines you are taking, including vitamins, herbs, eye drops, creams, and over-the-counter medicines.  Any problems you or family members have had with anesthetic medicines.  Any blood disorders you have.  Any surgeries you have had.  Any medical conditions you have.  Whether you are pregnant or may be pregnant. What are the risks? Generally, this is a safe procedure. However, problems may occur, including:  Infection.  Bleeding.  A tear (perforation) in the esophagus, stomach, or duodenum.  Trouble breathing.  Excessive sweating.  Spasms of the larynx.  A slowed heartbeat.  Low blood pressure.  What happens before the procedure?  Follow instructions from your health care provider about eating or drinking restrictions.  Ask your health care provider about: ? Changing or stopping your regular medicines. This is especially important if you are taking diabetes medicines or blood thinners. ? Taking medicines such as aspirin and ibuprofen. These medicines can thin your blood. Do not take these medicines before your procedure if your health care provider instructs you not to.  Plan to have someone take you home after the procedure.  If you wear dentures, be ready to remove them before the procedure. What happens during the procedure?  To reduce your risk of infection,  your health care team will wash or sanitize their hands.  An IV tube will be put in a vein in your hand or arm. You will get medicines and fluids through this tube.  You will be given one or more of the following: ? A medicine to help you relax (sedative). ? A medicine to numb the area (local anesthetic). This medicine may be sprayed into your throat. It will make you feel more comfortable and keep you from gagging or coughing during the procedure. ? A medicine for pain.  A mouth guard may be  placed in your mouth to protect your teeth and to keep you from biting on the endoscope.  You will be asked to lie on your left side.  The endoscope will be lowered down your throat into your esophagus, stomach, and duodenum.  Air will be put into the endoscope. This will help your health care provider see better.  The lining of your esophagus, stomach, and duodenum will be examined.  Your health care provider may: ? Take a tissue sample so it can be looked at in a lab (biopsy). ? Remove growths. ? Remove objects (foreign bodies) that are stuck. ? Treat any bleeding with medicines or other devices that stop tissue from bleeding. ? Widen (dilate) or stretch narrowed areas of your esophagus and stomach.  The endoscope will be taken out. The procedure may vary among health care providers and hospitals. What happens after the procedure?  Your blood pressure, heart rate, breathing rate, and blood oxygen level will be monitored often until the medicines you were given have worn off.  Do not eat or drink anything until the numbing medicine has worn off and your gag reflex has returned. This information is not intended to replace advice given to you by your health care provider. Make sure you discuss any questions you have with your health care provider. Document Released: 03/24/2005 Document Revised: 04/28/2016 Document Reviewed: 10/15/2015 Elsevier Interactive Patient Education  2018 [employer]. Esophagogastroduodenoscopy, Care After Refer to this sheet in the next few weeks. These instructions provide you with information about caring for yourself after your procedure. Your health care provider may also give you more specific instructions. Your treatment has been planned according to current medical practices, but problems sometimes occur. Call your health care provider if you have any problems or questions after your procedure. What can I expect after the procedure? After the procedure,  it is common to have:  A sore throat.  Nausea.  Bloating.  Dizziness.  Fatigue.  Follow these instructions at home:  Do not eat or drink anything until the numbing medicine (local anesthetic) has worn off and your gag reflex has returned. You will know that the local anesthetic has worn off when you can swallow comfortably.  Do not drive for 24 hours if you received a medicine to help you relax (sedative).  If your health care provider took a tissue sample for testing during the procedure, make sure to get your test results. This is your responsibility. Ask your health care provider or the department performing the test when your results will be ready.  Keep all follow-up visits as told by your health care provider. This is important. Contact a health care provider if:  You cannot stop coughing.  You are not urinating.  You are urinating less than usual. Get help right away if:  You have trouble swallowing.  You cannot eat or drink.  You have throat or chest pain that  gets worse.  You are dizzy or light-headed.  You faint.  You have nausea or vomiting.  You have chills.  You have a fever.  You have severe abdominal pain.  You have black, tarry, or bloody stools. This information is not intended to replace advice given to you by your health care provider. Make sure you discuss any questions you have with your health care provider. Document Released: 11/07/2012 Document Revised: 04/28/2016 Document Reviewed: 10/15/2015 Elsevier Interactive Patient Education  2018 [employer].  Colonoscopy, Adult A colonoscopy is an exam to look at the large intestine. It is done to check for problems, such as:  Lumps (tumors).  Growths (polyps).  Swelling (inflammation).  Bleeding.  What happens before the procedure? Eating and drinking Follow instructions from your doctor about eating and drinking. These instructions may include:  A few days before the procedure -  follow a low-fiber diet. ? Avoid nuts. ? Avoid seeds. ? Avoid dried fruit. ? Avoid raw fruits. ? Avoid vegetables.  1-3 days before the procedure - follow a clear liquid diet. Avoid liquids that have red or purple dye. Drink only clear liquids, such as: ? Clear broth or bouillon. ? Black coffee or tea. ? Clear juice. ? Clear soft drinks or sports drinks. ? Gelatin dessert. ? Popsicles.  On the day of the procedure - do not eat or drink anything during the 2 hours before the procedure.  Bowel prep If you were prescribed an oral bowel prep:  Take it as told by your doctor. Starting the day before your procedure, you will need to drink a lot of liquid. The liquid will cause you to poop (have bowel movements) until your poop is almost clear or light green.  If your skin or butt gets irritated from diarrhea, you may: ? Wipe the area with wipes that have medicine in them, such as adult wet wipes with aloe and vitamin E. ? Put something on your skin that soothes the area, such as petroleum jelly.  If you throw up (vomit) while drinking the bowel prep, take a break for up to 60 minutes. Then begin the bowel prep again. If you keep throwing up and you cannot take the bowel prep without throwing up, call your doctor.  General instructions  Ask your doctor about changing or stopping your normal medicines. This is important if you take diabetes medicines or blood thinners.  Plan to have someone take you home from the hospital or clinic. What happens during the procedure?  An IV tube may be put into one of your veins.  You will be given medicine to help you relax (sedative).  To reduce your risk of infection: ? Your doctors will wash their hands. ? Your anal area will be washed with soap.  You will be asked to lie on your side with your knees bent.  Your doctor will get a long, thin, flexible tube ready. The tube will have a camera and a light on the end.  The tube will be put into  your anus.  The tube will be gently put into your large intestine.  Air will be delivered into your large intestine to keep it open. You may feel some pressure or cramping.  The camera will be used to take photos.  A small tissue sample may be removed from your body to be looked at under a microscope (biopsy). If any possible problems are found, the tissue will be sent to a lab for testing.  If  small growths are found, your doctor may remove them and have them checked for cancer.  The tube that was put into your anus will be slowly removed. The procedure may vary among doctors and hospitals. What happens after the procedure?  Your doctor will check on you often until the medicines you were given have worn off.  Do not drive for 24 hours after the procedure.  You may have a small amount of blood in your poop.  You may pass gas.  You may have mild cramps or bloating in your belly (abdomen).  It is up to you to get the results of your procedure. Ask your doctor, or the department performing the procedure, when your results will be ready. This information is not intended to replace advice given to you by your health care provider. Make sure you discuss any questions you have with your health care provider. Document Released: 12/24/2010 Document Revised: 09/21/2016 Document Reviewed: 02/02/2016 Elsevier Interactive Patient Education  2017 [employer]  Colonoscopy, Adult, Care After This sheet gives you information about how to care for yourself after your procedure. Your health care provider may also give you more specific instructions. If you have problems or questions, contact your health care provider. What can I expect after the procedure? After the procedure, it is common to have:  A small amount of blood in your stool for 24 hours after the procedure.  Some gas.  Mild abdominal cramping or bloating.  Follow these instructions at home: General instructions   For the  first 24 hours after the procedure: ? Do not drive or use machinery. ? Do not sign important documents. ? Do not drink alcohol. ? Do your regular daily activities at a slower pace than normal. ? Eat soft, easy-to-digest foods. ? Rest often.  Take over-the-counter or prescription medicines only as told by your health care provider.  It is up to you to get the results of your procedure. Ask your health care provider, or the department performing the procedure, when your results will be ready. Relieving cramping and bloating  Try walking around when you have cramps or feel bloated.  Apply heat to your abdomen as told by your health care provider. Use a heat source that your health care provider recommends, such as a moist heat pack or a heating pad. ? Place a towel between your skin and the heat source. ? Leave the heat on for 20-30 minutes. ? Remove the heat if your skin turns bright red. This is especially important if you are unable to feel pain, heat, or cold. You may have a greater risk of getting burned. Eating and drinking  Drink enough fluid to keep your urine clear or pale yellow.  Resume your normal diet as instructed by your health care provider. Avoid heavy or fried foods that are hard to digest.  Avoid drinking alcohol for as long as instructed by your health care provider. Contact a health care provider if:  You have blood in your stool 2-3 days after the procedure. Get help right away if:  You have more than a small spotting of blood in your stool.  You pass large blood clots in your stool.  Your abdomen is swollen.  You have nausea or vomiting.  You have a fever.  You have increasing abdominal pain that is not relieved with medicine. This information is not intended to replace advice given to you by your health care provider. Make sure you discuss any questions you have  with your health care provider. Document Released: 07/05/2004 Document Revised: 08/15/2016  Document Reviewed: 02/02/2016 Elsevier Interactive Patient Education  2018 [place] Anesthesia is a term that refers to techniques, procedures, and medicines that help a person stay safe and comfortable during a medical procedure. Monitored anesthesia care, or sedation, is one type of anesthesia. Your anesthesia specialist may recommend sedation if you will be having a procedure that does not require you to be unconscious, such as:  Cataract surgery.  A dental procedure.  A biopsy.  A colonoscopy.  During the procedure, you may receive a medicine to help you relax (sedative). There are three levels of sedation:  Mild sedation. At this level, you may feel awake and relaxed. You will be able to follow directions.  Moderate sedation. At this level, you will be sleepy. You may not remember the procedure.  Deep sedation. At this level, you will be asleep. You will not remember the procedure.  The more medicine you are given, the deeper your level of sedation will be. Depending on how you respond to the procedure, the anesthesia specialist may change your level of sedation or the type of anesthesia to fit your needs. An anesthesia specialist will monitor you closely during the procedure. Let your health care provider know about:  Any allergies you have.  All medicines you are taking, including vitamins, herbs, eye drops, creams, and over-the-counter medicines.  Any use of steroids (by mouth or as a cream).  Any problems you or family members have had with sedatives and anesthetic medicines.  Any blood disorders you have.  Any surgeries you have had.  Any medical conditions you have, such as sleep apnea.  Whether you are pregnant or may be pregnant.  Any use of cigarettes, alcohol, or street drugs. What are the risks? Generally, this is a safe procedure. However, problems may occur, including:  Getting too much medicine  (oversedation).  Nausea.  Allergic reaction to medicines.  Trouble breathing. If this happens, a breathing tube may be used to help with breathing. It will be removed when you are awake and breathing on your own.  Heart trouble.  Lung trouble.  Before the procedure Staying hydrated Follow instructions from your health care provider about hydration, which may include:  Up to 2 hours before the procedure - you may continue to drink clear liquids, such as water, clear fruit juice, black coffee, and plain tea.  Eating and drinking restrictions Follow instructions from your health care provider about eating and drinking, which may include:  8 hours before the procedure - stop eating heavy meals or foods such as meat, fried foods, or fatty foods.  6 hours before the procedure - stop eating light meals or foods, such as toast or cereal.  6 hours before the procedure - stop drinking milk or drinks that contain milk.  2 hours before the procedure - stop drinking clear liquids.  Medicines Ask your health care provider about:  Changing or stopping your regular medicines. This is especially important if you are taking diabetes medicines or blood thinners.  Taking medicines such as aspirin and ibuprofen. These medicines can thin your blood. Do not take these medicines before your procedure if your health care provider instructs you not to.  Tests and exams  You will have a physical exam.  You may have blood tests done to show: ? How well your kidneys and liver are working. ? How well your blood can clot.  General  instructions  Plan to have someone take you home from the hospital or clinic.  If you will be going home right after the procedure, plan to have someone with you for 24 hours.  What happens during the procedure?  Your blood pressure, heart rate, breathing, level of pain and overall condition will be monitored.  An IV tube will be inserted into one of your  veins.  Your anesthesia specialist will give you medicines as needed to keep you comfortable during the procedure. This may mean changing the level of sedation.  The procedure will be performed. After the procedure  Your blood pressure, heart rate, breathing rate, and blood oxygen level will be monitored until the medicines you were given have worn off.  Do not drive for 24 hours if you received a sedative.  You may: ? Feel sleepy, clumsy, or nauseous. ? Feel forgetful about what happened after the procedure. ? Have a sore throat if you had a breathing tube during the procedure. ? Vomit. This information is not intended to replace advice given to you by your health care provider. Make sure you discuss any questions you have with your health care provider. Document Released: 08/17/2005 Document Revised: 04/29/2016 Document Reviewed: 03/13/2016 Elsevier Interactive Patient Education  2018 [place] Anesthesia is a term that refers to techniques, procedures, and medicines that help a person stay safe and comfortable during a medical procedure. Monitored anesthesia care, or sedation, is one type of anesthesia. Your anesthesia specialist may recommend sedation if you will be having a procedure that does not require you to be unconscious, such as:  Cataract surgery.  A dental procedure.  A biopsy.  A colonoscopy.  During the procedure, you may receive a medicine to help you relax (sedative). There are three levels of sedation:  Mild sedation. At this level, you may feel awake and relaxed. You will be able to follow directions.  Moderate sedation. At this level, you will be sleepy. You may not remember the procedure.  Deep sedation. At this level, you will be asleep. You will not remember the procedure.  The more medicine you are given, the deeper your level of sedation will be. Depending on how you respond to the procedure, the anesthesia specialist may  change your level of sedation or the type of anesthesia to fit your needs. An anesthesia specialist will monitor you closely during the procedure. Let your health care provider know about:  Any allergies you have.  All medicines you are taking, including vitamins, herbs, eye drops, creams, and over-the-counter medicines.  Any use of steroids (by mouth or as a cream).  Any problems you or family members have had with sedatives and anesthetic medicines.  Any blood disorders you have.  Any surgeries you have had.  Any medical conditions you have, such as sleep apnea.  Whether you are pregnant or may be pregnant.  Any use of cigarettes, alcohol, or street drugs. What are the risks? Generally, this is a safe procedure. However, problems may occur, including:  Getting too much medicine (oversedation).  Nausea.  Allergic reaction to medicines.  Trouble breathing. If this happens, a breathing tube may be used to help with breathing. It will be removed when you are awake and breathing on your own.  Heart trouble.  Lung trouble.  Before the procedure Staying hydrated Follow instructions from your health care provider about hydration, which may include:  Up to 2 hours before the procedure - you may continue to  drink clear liquids, such as water, clear fruit juice, black coffee, and plain tea.  Eating and drinking restrictions Follow instructions from your health care provider about eating and drinking, which may include:  8 hours before the procedure - stop eating heavy meals or foods such as meat, fried foods, or fatty foods.  6 hours before the procedure - stop eating light meals or foods, such as toast or cereal.  6 hours before the procedure - stop drinking milk or drinks that contain milk.  2 hours before the procedure - stop drinking clear liquids.  Medicines Ask your health care provider about:  Changing or stopping your regular medicines. This is especially  important if you are taking diabetes medicines or blood thinners.  Taking medicines such as aspirin and ibuprofen. These medicines can thin your blood. Do not take these medicines before your procedure if your health care provider instructs you not to.  Tests and exams  You will have a physical exam.  You may have blood tests done to show: ? How well your kidneys and liver are working. ? How well your blood can clot.  General instructions  Plan to have someone take you home from the hospital or clinic.  If you will be going home right after the procedure, plan to have someone with you for 24 hours.  What happens during the procedure?  Your blood pressure, heart rate, breathing, level of pain and overall condition will be monitored.  An IV tube will be inserted into one of your veins.  Your anesthesia specialist will give you medicines as needed to keep you comfortable during the procedure. This may mean changing the level of sedation.  The procedure will be performed. After the procedure  Your blood pressure, heart rate, breathing rate, and blood oxygen level will be monitored until the medicines you were given have worn off.  Do not drive for 24 hours if you received a sedative.  You may: ? Feel sleepy, clumsy, or nauseous. ? Feel forgetful about what happened after the procedure. ? Have a sore throat if you had a breathing tube during the procedure. ? Vomit. This information is not intended to replace advice given to you by your health care provider. Make sure you discuss any questions you have with your health care provider. Document Released: 08/17/2005 Document Revised: 04/29/2016 Document Reviewed: 03/13/2016 Elsevier Interactive Patient Education  [employer].

## 2018-06-12 ENCOUNTER — Encounter (HOSPITAL_COMMUNITY)
Admission: RE | Admit: 2018-06-12 | Discharge: 2018-06-12 | Disposition: A | Discharge status: Home / Self Care | Payer: Medicare Other | Source: Ambulatory Visit | Attending: Gastroenterology | Admitting: Gastroenterology

## 2018-06-12 ENCOUNTER — Encounter (HOSPITAL_COMMUNITY): Payer: Self-pay

## 2018-06-12 DIAGNOSIS — I10 Essential (primary) hypertension: Secondary | ICD-10-CM | POA: Diagnosis not present

## 2018-06-12 DIAGNOSIS — Z01812 Encounter for preprocedural laboratory examination: Secondary | ICD-10-CM | POA: Diagnosis not present

## 2018-06-12 DIAGNOSIS — Z01818 Encounter for other preprocedural examination: Secondary | ICD-10-CM | POA: Insufficient documentation

## 2018-06-12 LAB — CBC
HCT: 43.3 % (ref 39.0–52.0)
Hemoglobin: 14.2 g/dL (ref 13.0–17.0)
MCH: 29.3 pg (ref 26.0–34.0)
MCHC: 32.8 g/dL (ref 30.0–36.0)
MCV: 89.3 fL (ref 78.0–100.0)
Platelets: 196 10*3/uL (ref 150–400)
RBC: 4.85 MIL/uL (ref 4.22–5.81)
RDW: 13.1 % (ref 11.5–15.5)
WBC: 6.3 10*3/uL (ref 4.0–10.5)

## 2018-06-12 LAB — BASIC METABOLIC PANEL
Anion gap: 7 (ref 5–15)
BUN: 18 mg/dL (ref 8–23)
CO2: 22 mmol/L (ref 22–32)
Calcium: 8.9 mg/dL (ref 8.9–10.3)
Chloride: 111 mmol/L (ref 98–111)
Creatinine, Ser: 0.94 mg/dL (ref 0.61–1.24)
GFR calc Af Amer: >60 mL/min (ref >60)
GFR calc non Af Amer: >60 mL/min (ref >60)
Glucose, Bld: 118 mg/dL — ABNORMAL HIGH (ref 70–99)
Potassium: 3.7 mmol/L (ref 3.5–5.1)
Sodium: 140 mmol/L (ref 135–145)

## 2018-06-12 NOTE — Progress Notes (Signed)
Per patient, he can't keep up with all of his appointments and has too much going on.  States that he can't come to appointment today.  Will reschedule.

## 2018-06-18 ENCOUNTER — Telehealth: Payer: Self-pay | Admitting: General Practice

## 2018-06-18 ENCOUNTER — Telehealth: Payer: Self-pay

## 2018-06-18 NOTE — Telephone Encounter (Signed)
Patient's wife called and stated they lost her husband's prep instructions.  I asked if she wanted to pick them up since his procedure is tomorrow, however she refused to pick them up.  I verbally went over instructions with her and she stated she understood.

## 2018-06-18 NOTE — Telephone Encounter (Signed)
Pt's wife called office. He is scheduled for TCS/EGD w/[facility] tomorrow. He picked up his prep but doesn't have his instructions. He was suppose to start clear liquids after lunch yesterday. He ate yesterday but hasn't ate anything this morning. Informed wife I would ask [facility] if he can still have the procedure tomorrow and call her back. [phone]

## 2018-06-18 NOTE — Telephone Encounter (Signed)
REVIEWED. OK FOR TCS JUL 16.

## 2018-06-18 NOTE — Telephone Encounter (Signed)
See other phone note. Rosendo Gros spoke to pt's wife.

## 2018-06-19 ENCOUNTER — Ambulatory Visit (HOSPITAL_COMMUNITY): Payer: Medicare Other | Admitting: Anesthesiology

## 2018-06-19 ENCOUNTER — Encounter (HOSPITAL_COMMUNITY): Payer: Self-pay | Admitting: *Deleted

## 2018-06-19 ENCOUNTER — Ambulatory Visit (HOSPITAL_COMMUNITY)
Admission: RE | Admit: 2018-06-19 | Discharge: 2018-06-19 | Disposition: A | Discharge status: Home / Self Care | Payer: Medicare Other | Source: Ambulatory Visit | Attending: Gastroenterology | Admitting: Gastroenterology

## 2018-06-19 ENCOUNTER — Encounter (HOSPITAL_COMMUNITY)
Admission: RE | Disposition: A | Discharge status: Home / Self Care | Payer: Self-pay | Source: Ambulatory Visit | Attending: Gastroenterology

## 2018-06-19 DIAGNOSIS — Z79899 Other long term (current) drug therapy: Secondary | ICD-10-CM | POA: Insufficient documentation

## 2018-06-19 DIAGNOSIS — K621 Rectal polyp: Secondary | ICD-10-CM

## 2018-06-19 DIAGNOSIS — Z87891 Personal history of nicotine dependence: Secondary | ICD-10-CM | POA: Insufficient documentation

## 2018-06-19 DIAGNOSIS — I4891 Unspecified atrial fibrillation: Secondary | ICD-10-CM | POA: Insufficient documentation

## 2018-06-19 DIAGNOSIS — I428 Other cardiomyopathies: Secondary | ICD-10-CM | POA: Insufficient documentation

## 2018-06-19 DIAGNOSIS — I10 Essential (primary) hypertension: Secondary | ICD-10-CM | POA: Diagnosis not present

## 2018-06-19 DIAGNOSIS — K625 Hemorrhage of anus and rectum: Secondary | ICD-10-CM

## 2018-06-19 DIAGNOSIS — K2970 Gastritis, unspecified, without bleeding: Secondary | ICD-10-CM | POA: Diagnosis not present

## 2018-06-19 DIAGNOSIS — I2510 Atherosclerotic heart disease of native coronary artery without angina pectoris: Secondary | ICD-10-CM | POA: Insufficient documentation

## 2018-06-19 DIAGNOSIS — K921 Melena: Secondary | ICD-10-CM | POA: Diagnosis not present

## 2018-06-19 DIAGNOSIS — K219 Gastro-esophageal reflux disease without esophagitis: Secondary | ICD-10-CM | POA: Diagnosis not present

## 2018-06-19 DIAGNOSIS — K644 Residual hemorrhoidal skin tags: Secondary | ICD-10-CM

## 2018-06-19 DIAGNOSIS — Z86010 Personal history of colonic polyps: Secondary | ICD-10-CM | POA: Diagnosis not present

## 2018-06-19 DIAGNOSIS — R1310 Dysphagia, unspecified: Secondary | ICD-10-CM | POA: Diagnosis not present

## 2018-06-19 DIAGNOSIS — Z7982 Long term (current) use of aspirin: Secondary | ICD-10-CM | POA: Diagnosis not present

## 2018-06-19 DIAGNOSIS — K648 Other hemorrhoids: Secondary | ICD-10-CM | POA: Diagnosis not present

## 2018-06-19 DIAGNOSIS — Z79891 Long term (current) use of opiate analgesic: Secondary | ICD-10-CM | POA: Insufficient documentation

## 2018-06-19 DIAGNOSIS — K2270 Barrett's esophagus without dysplasia: Secondary | ICD-10-CM | POA: Diagnosis not present

## 2018-06-19 DIAGNOSIS — Q438 Other specified congenital malformations of intestine: Secondary | ICD-10-CM | POA: Insufficient documentation

## 2018-06-19 DIAGNOSIS — Z8719 Personal history of other diseases of the digestive system: Secondary | ICD-10-CM | POA: Diagnosis not present

## 2018-06-19 HISTORY — PX: POLYPECTOMY: SHX5525

## 2018-06-19 HISTORY — PX: COLONOSCOPY WITH PROPOFOL: SHX5780

## 2018-06-19 HISTORY — PX: ESOPHAGOGASTRODUODENOSCOPY (EGD) WITH PROPOFOL: SHX5813

## 2018-06-19 HISTORY — PX: SAVORY DILATION: SHX5439

## 2018-06-19 SURGERY — COLONOSCOPY WITH PROPOFOL
Anesthesia: General | Site: Esophagus

## 2018-06-19 MED ORDER — PROPOFOL 10 MG/ML IV BOLUS
INTRAVENOUS | Status: AC
  Filled 2018-06-19: qty 120

## 2018-06-19 MED ORDER — CHLORHEXIDINE GLUCONATE CLOTH 2 % EX PADS: 6.0000 | MEDICATED_PAD | Freq: Once | CUTANEOUS | Status: DC

## 2018-06-19 MED ORDER — SIMETHICONE 40 MG/0.6ML PO SUSP
ORAL | Status: AC
  Filled 2018-06-19: qty 0.6

## 2018-06-19 MED ORDER — PROPOFOL 10 MG/ML IV BOLUS
INTRAVENOUS | Status: DC | PRN
  Administered 2018-06-19 (×4): 20 mg via INTRAVENOUS
  Administered 2018-06-19: 40 mg via INTRAVENOUS

## 2018-06-19 MED ORDER — FENTANYL CITRATE (PF) 100 MCG/2ML IJ SOLN: 25.0000 ug | INTRAMUSCULAR | Status: DC | PRN

## 2018-06-19 MED ORDER — PROPOFOL 500 MG/50ML IV EMUL
INTRAVENOUS | Status: DC | PRN
  Administered 2018-06-19: 125 ug/kg/min via INTRAVENOUS
  Administered 2018-06-19: 08:00:00 via INTRAVENOUS
  Administered 2018-06-19: 110 ug/kg/min via INTRAVENOUS

## 2018-06-19 MED ORDER — STERILE WATER FOR IRRIGATION IR SOLN
Freq: Once | Status: AC
  Administered 2018-06-19: 1.5 mL
  Filled 2018-06-19: qty 5

## 2018-06-19 MED ORDER — LACTATED RINGERS IV SOLN
INTRAVENOUS | Status: DC
  Administered 2018-06-19: 1000 mL via INTRAVENOUS

## 2018-06-19 MED ORDER — LIDOCAINE VISCOUS HCL 2 % MT SOLN
OROMUCOSAL | Status: AC
  Filled 2018-06-19: qty 15

## 2018-06-19 MED ORDER — MINERAL OIL PO OIL
TOPICAL_OIL | ORAL | Status: AC
  Filled 2018-06-19: qty 30

## 2018-06-19 MED ORDER — HYDROCODONE-ACETAMINOPHEN 7.5-325 MG PO TABS: 1.0000 | ORAL_TABLET | Freq: Once | ORAL | Status: DC | PRN

## 2018-06-19 NOTE — Addendum Note (Signed)
Addendum  created 06/19/18 1022 by Vista Deck, CRNA   Intraprocedure Flowsheets edited, Sign clinical note

## 2018-06-19 NOTE — Anesthesia Preprocedure Evaluation (Signed)
Anesthesia Evaluation  Patient identified by MRN, date of birth, ID band Patient awake    Reviewed: Allergy & Precautions, NPO status , Patient's Chart, lab work & pertinent test results  Airway Mallampati: I  TM Distance: >3 FB Neck ROM: Full    Dental no notable dental hx.    Pulmonary neg pulmonary ROS, former smoker,    Pulmonary exam normal breath sounds clear to auscultation       Cardiovascular Exercise Tolerance: Good hypertension, Pt. on medications + CAD and +CHF  negative cardio ROS Normal cardiovascular exam+ dysrhythmias Atrial Fibrillation I Rhythm:Irregular Rate:Normal  Chronic old  Afib - states good ET Never cardioverted    Neuro/Psych negative neurological ROS  negative psych ROS   GI/Hepatic negative GI ROS, Neg liver ROS, GERD  Medicated and Controlled,  Endo/Other  negative endocrine ROS  Renal/GU negative Renal ROS  negative genitourinary   Musculoskeletal negative musculoskeletal ROS (+) Arthritis , Osteoarthritis,    Abdominal   Peds negative pediatric ROS (+)  Hematology negative hematology ROS (+)   Anesthesia Other Findings   Reproductive/Obstetrics negative OB ROS                             Anesthesia Physical Anesthesia Plan  ASA: III  Anesthesia Plan: General   Post-op Pain Management:    Induction: Intravenous  PONV Risk Score and Plan:   Airway Management Planned: Nasal Cannula  Additional Equipment:   Intra-op Plan:   Post-operative Plan:   Informed Consent: I have reviewed the patients History and Physical, chart, labs and discussed the procedure including the risks, benefits and alternatives for the proposed anesthesia with the patient or authorized representative who has indicated his/her understanding and acceptance.   Dental advisory given  Plan Discussed with: CRNA  Anesthesia Plan Comments:         Anesthesia Quick  Evaluation

## 2018-06-19 NOTE — Anesthesia Postprocedure Evaluation (Signed)
Anesthesia Post Note  Patient: David Dougherty  Procedure(s) Performed: COLONOSCOPY WITH PROPOFOL (N/A ) ESOPHAGOGASTRODUODENOSCOPY (EGD) WITH PROPOFOL (N/A ) SAVORY DILATION (Esophagus) POLYPECTOMY  Patient location during evaluation: PACU Anesthesia Type: General Level of consciousness: awake and alert and patient cooperative Pain management: satisfactory to patient Vital Signs Assessment: post-procedure vital signs reviewed and stable Respiratory status: spontaneous breathing Cardiovascular status: stable Postop Assessment: no apparent nausea or vomiting Anesthetic complications: no     Last Vitals:  Vitals:   06/19/18 0830 06/19/18 0833  BP: (!) 144/114 (!) 109/92  Pulse: 80 89  Resp: 18 (!) 21  Temp:    SpO2: 98% 97%    Last Pain:  Vitals:   06/19/18 0828  TempSrc:   PainSc: 0-No pain                 Ziquan Fidel

## 2018-06-19 NOTE — Op Note (Signed)
[facility] Patient Name: David Dougherty Procedure Date: 06/19/2018 8:02 AM MRN: [id number] Date of Birth: [birth year] Attending MD: Barney Drain MD, MD CSN: [id number] Age: 67 Admit Type: Outpatient Procedure:                Upper GI endoscopy with COLD FORCEPS                            BIOPSY/ESOPHAGEAL DILATION Indications:              Dysphagia, Follow-up of Barrett's esophagus Providers:                Barney Drain MD, MD, Selena Lesser RN, RN, Randa Spike, Technician Referring MD:             Elayne Snare. Luking Medicines:                Propofol per Anesthesia Complications:            No immediate complications. Estimated Blood Loss:     Estimated blood loss was minimal. Procedure:                Pre-Anesthesia Assessment:                           - Prior to the procedure, a History and Physical                            was performed, and patient medications and                            allergies were reviewed. The patient's tolerance of                            previous anesthesia was also reviewed. The risks                            and benefits of the procedure and the sedation                            options and risks were discussed with the patient.                            All questions were answered, and informed consent                            was obtained. Prior Anticoagulants: The patient has                            taken aspirin, last dose was 7 days prior to                            procedure. ASA Grade Assessment: II - A patient  with mild systemic disease. After reviewing the                            risks and benefits, the patient was deemed in                            satisfactory condition to undergo the procedure.                            After obtaining informed consent, the endoscope was                            passed under direct vision. Throughout the                  procedure, the patient's blood pressure, pulse, and                            oxygen saturations were monitored continuously. The                            [id number]-H190 ([id number]) scope was introduced through the                            mouth, and advanced to the second part of duodenum.                            The upper GI endoscopy was accomplished without                            difficulty. The patient tolerated the procedure                            well. Scope In: 8:08:35 AM Scope Out: 8:18:23 AM Total Procedure Duration: 0 hours 9 minutes 48 seconds  Findings:      There were esophageal mucosal changes secondary to established       short-segment Barrett's disease present at the gastroesophageal       junction. The maximum longitudinal extent of these mucosal changes was 1       cm in length. This was biopsied with a cold forceps for histology.      No endoscopic abnormality was evident in the esophagus to explain the       patient's complaint of dysphagia. It was decided, however, to proceed       with dilation of the entire esophagus DUE TO POSSIBLE PROXIMAL       ESOPAHGEAL WEB. A guidewire was placed and the scope was withdrawn.       Dilation was performed with a Savary dilator with no resistance at 15       mm, 16 mm and 17 mm.      Diffuse moderate inflammation characterized by congestion (edema) and       erythema was found in the gastric antrum.      The examined duodenum was normal. Impression:               - Esophageal mucosal changes secondary to  established short-segment Barrett's disease.                            Biopsied.                           - No endoscopic esophageal abnormality to explain                            patient's dysphagia. Esophagus dilated. Dilated.                           - MILD Gastritis. Moderate Sedation:      Per Anesthesia Care Recommendation:           - High fiber diet.                            - Continue present medications. OMEPRAZOLE 30 MINS                            PRIOR TO BREAKFAST.                           - Await pathology results.                           - Repeat upper endoscopy in 3 - 5 years for                            surveillance.                           - Return to GI office in 4 months.                           - Patient has a contact number available for                            emergencies. The signs and symptoms of potential                            delayed complications were discussed with the                            patient. Return to normal activities tomorrow.                            Written discharge instructions were provided to the                            patient. Procedure Code(s):        --- Professional ---                           [phone], Esophagogastroduodenoscopy, flexible,  transoral; with insertion of guide wire followed by                            passage of dilator(s) through esophagus over guide                            wire                           [id number], Esophagogastroduodenoscopy, flexible,                            transoral; with biopsy, single or multiple Diagnosis Code(s):        --- Professional ---                           K22.70, Barrett's esophagus without dysplasia                           R13.10, Dysphagia, unspecified                           K29.70, Gastritis, unspecified, without bleeding CPT copyright 2017 [employer]. All rights reserved. The codes documented in this report are preliminary and upon coder review may  be revised to meet current compliance requirements. Barney Drain, MD Barney Drain MD, MD 06/19/2018 8:54:39 AM This report has been signed electronically. Number of Addenda: 0

## 2018-06-19 NOTE — Anesthesia Procedure Notes (Signed)
Procedure Name: MAC Date/Time: 06/19/2018 7:40 AM Performed by: Vista Deck, CRNA Pre-anesthesia Checklist: Patient identified, Emergency Drugs available, Suction available, Timeout performed and Patient being monitored Patient Re-evaluated:Patient Re-evaluated prior to induction Oxygen Delivery Method: Non-rebreather mask

## 2018-06-19 NOTE — Discharge Instructions (Signed)
You had 1 SMALL polyp removed. YOU HAVE SMALL INTERNAL HEMORRHOIDS, WHICH CAN CAUSE RECTAL BLEEDING, AND MODERATE EXTERNAL HEMORRHOIDS. YOUR INTERNAL HEMORRHOIDS WERE NOT BIG ENOUGH TO BAND. YOU HAVE GASTRITIS DUE TO ASPIRIN USE. I STRETCHED YOUR ESOPHAGUS DUE YOUR PROBLEMS SWALLOWING. I BIOPSIED YOUR ESOPHAGUS.    DRINK WATER TO KEEP YOUR URINE LIGHT YELLOW.  FOLLOW A HIGH FIBER DIET. AVOID ITEMS THAT CAUSE BLOATING. SEE INFO BELOW.   CONTINUE OMEPRAZOLE.  TAKE 30 MINUTES PRIOR TO YOUR FIRST MEAL.  YOUR BIOPSY WILL BE BACK IN 7 DAYS.  Follow up in 4 mos.  Next colonoscopy in 5-10 years.    ENDOSCOPY Care After Read the instructions outlined below and refer to this sheet in the next week. These discharge instructions provide you with general information on caring for yourself after you leave the hospital. While your treatment has been planned according to the most current medical practices available, unavoidable complications occasionally occur. If you have any problems or questions after discharge, call DR. Guynell Kleiber, [phone].  ACTIVITY  You may resume your regular activity, but move at a slower pace for the next 24 hours.   Take frequent rest periods for the next 24 hours.   Walking will help get rid of the air and reduce the bloated feeling in your belly (abdomen).   No driving for 24 hours (because of the medicine (anesthesia) used during the test).   You may shower.   Do not sign any important legal documents or operate any machinery for 24 hours (because of the anesthesia used during the test).    NUTRITION  Drink plenty of fluids.   You may resume your normal diet as instructed by your doctor.   Begin with a light meal and progress to your normal diet. Heavy or fried foods are harder to digest and may make you feel sick to your stomach (nauseated).   Avoid alcoholic beverages for 24 hours or as instructed.    MEDICATIONS  You may resume your normal  medications.   WHAT YOU CAN EXPECT TODAY  Some feelings of bloating in the abdomen.   Passage of more gas than usual.   Spotting of blood in your stool or on the toilet paper  .  IF YOU HAD POLYPS REMOVED DURING THE ENDOSCOPY:  Eat a soft diet IF YOU HAVE NAUSEA, BLOATING, ABDOMINAL PAIN, OR VOMITING.    FINDING OUT THE RESULTS OF YOUR TEST Not all test results are available during your visit. DR. Oneida Alar WILL CALL YOU WITHIN 7 DAYS OF YOUR PROCEDUE WITH YOUR RESULTS. Do not assume everything is normal if you have not heard from DR. Dannon Perlow IN ONE WEEK, CALL HER OFFICE AT [phone].  SEEK IMMEDIATE MEDICAL ATTENTION AND CALL THE OFFICE: [phone] IF:  You have more than a spotting of blood in your stool.   Your belly is swollen (abdominal distention).   You are nauseated or vomiting.   You have a temperature over 101F.   You have abdominal pain or discomfort that is severe or gets worse throughout the day.   High-Fiber Diet A high-fiber diet changes your normal diet to include more whole grains, legumes, fruits, and vegetables. Changes in the diet involve replacing refined carbohydrates with unrefined foods. The calorie level of the diet is essentially unchanged. The Dietary Reference Intake (recommended amount) for adult males is 38 grams per day. For adult females, it is 25 grams per day. Pregnant and lactating women should consume 28 grams of fiber per day.  Fiber is the intact part of a plant that is not broken down during digestion. Functional fiber is fiber that has been isolated from the plant to provide a beneficial effect in the body. PURPOSE  Increase stool bulk.   Ease and regulate bowel movements.   Lower cholesterol.  INDICATIONS THAT YOU NEED MORE FIBER  Constipation and hemorrhoids.   Uncomplicated diverticulosis (intestine condition) and irritable bowel syndrome.   Weight management.   As a protective measure against hardening of the arteries  (atherosclerosis), diabetes, and cancer.   GUIDELINES FOR INCREASING FIBER IN THE DIET  Start adding fiber to the diet slowly. A gradual increase of about 5 more grams (2 slices of whole-wheat bread, 2 servings of most fruits or vegetables, or 1 bowl of high-fiber cereal) per day is best. Too rapid an increase in fiber may result in constipation, flatulence, and bloating.   Drink enough water and fluids to keep your urine clear or pale yellow. Water, juice, or caffeine-free drinks are recommended. Not drinking enough fluid may cause constipation.   Eat a variety of high-fiber foods rather than one type of fiber.   Try to increase your intake of fiber through using high-fiber foods rather than fiber pills or supplements that contain small amounts of fiber.   The goal is to change the types of food eaten. Do not supplement your present diet with high-fiber foods, but replace foods in your present diet.  INCLUDE A VARIETY OF FIBER SOURCES  Replace refined and processed grains with whole grains, canned fruits with fresh fruits, and incorporate other fiber sources. White rice, white breads, and most bakery goods contain little or no fiber.   Brown whole-grain rice, buckwheat oats, and many fruits and vegetables are all good sources of fiber. These include: broccoli, Brussels sprouts, cabbage, cauliflower, beets, sweet potatoes, white potatoes (skin on), carrots, tomatoes, eggplant, squash, berries, fresh fruits, and dried fruits.   Cereals appear to be the richest source of fiber. Cereal fiber is found in whole grains and bran. Bran is the fiber-rich outer coat of cereal grain, which is largely removed in refining. In whole-grain cereals, the bran remains. In breakfast cereals, the largest amount of fiber is found in those with "bran" in their names. The fiber content is sometimes indicated on the label.   You may need to include additional fruits and vegetables each day.   In baking, for 1 cup  white flour, you may use the following substitutions:   1 cup whole-wheat flour minus 2 tablespoons.   1/2 cup white flour plus 1/2 cup whole-wheat flour.    Polyps, Colon  A polyp is extra tissue that grows inside your body. Colon polyps grow in the large intestine. The large intestine, also called the colon, is part of your digestive system. It is a long, hollow tube at the end of your digestive tract where your body makes and stores stool. Most polyps are not dangerous. They are benign. This means they are not cancerous. But over time, some types of polyps can turn into cancer. Polyps that are smaller than a pea are usually not harmful. But larger polyps could someday become or may already be cancerous. To be safe, doctors remove all polyps and test them.   WHO GETS POLYPS? Anyone can get polyps, but certain people are more likely than others. You may have a greater chance of getting polyps if:  You are over 50.   You have had polyps before.   Someone in  your family has had polyps.   Someone in your family has had cancer of the large intestine.   Find out if someone in your family has had polyps. You may also be more likely to get polyps if you:   Eat a lot of fatty foods   Smoke   Drink alcohol   Do not exercise  Eat too much   TREATMENT  The caregiver will remove the polyp during sigmoidoscopy or colonoscopy.  PREVENTION There is not one sure way to prevent polyps. You might be able to lower your risk of getting them if you:  Eat more fruits and vegetables and less fatty food.   Do not smoke.   Avoid alcohol.   Exercise every day.   Lose weight if you are overweight.   Eating more calcium and folate can also lower your risk of getting polyps. Some foods that are rich in calcium are milk, cheese, and broccoli. Some foods that are rich in folate are chickpeas, kidney beans, and spinach.    REFLUX  SYMPTOMS Common symptoms of GERD are heartburn (burning in your  chest). This is worse when lying down or bending over. It may also cause belching, or difficulty swallowing, and indigestion. Some of the things which make GERD worse are:  Increased weight pushes on stomach making acid rise more easily.   Smoking markedly increases acid production.   Alcohol decreases lower esophageal sphincter pressure (valve between stomach and esophagus), allowing acid from stomach into esophagus.   Late evening meals and going to bed with a full stomach increases pressure.   Anything that causes an increase in acid production.    HOME CARE INSTRUCTIONS  Try to achieve and maintain an ideal body weight.   Avoid drinking alcoholic beverages.   DO NOT smokE.   Do not wear tight clothing around your chest or stomach.   Eat smaller meals and eat more frequently. This keeps your stomach from getting too full. Eat slowly.   Do not lie down for 2 or 3 hours after eating. Do not eat or drink anything 1 to 2 hours before going to bed.   Avoid caffeine beverages (colas, coffee, cocoa, tea), fatty foods, citrus fruits and all other foods and drinks that contain acid and that seem to increase the problems.   Avoid bending over, especially after eating OR STRAINING. Anything that increases the pressure in your belly increases the amount of acid that may be pushed up into your esophagus.   Gastritis  Gastritis is an inflammation (the body's way of reacting to injury and/or infection) of the stomach. It is often caused by viral or bacterial (germ) infections. It can also be caused BY ASPIRIN, BC/GOODY POWDER'S, (IBUPROFEN) MOTRIN, OR ALEVE (NAPROXEN), chemicals (including alcohol), SPICY FOODS, and medications. This illness may be associated with generalized malaise (feeling tired, not well), UPPER ABDOMINAL STOMACH cramps, and fever. One common bacterial cause of gastritis is an organism known as H. Pylori. This can be treated with antibiotics.   Hemorrhoids Hemorrhoids are  dilated (enlarged) veins around the rectum. Sometimes clots will form in the veins. This makes them swollen and painful. These are called thrombosed hemorrhoids. Causes of hemorrhoids include:  Constipation.   Straining to have a bowel movement.   HEAVY LIFTING HOME CARE INSTRUCTIONS  Eat a well balanced diet and drink 6 to 8 glasses of water every day to avoid constipation. You may also use a bulk laxative.   Avoid straining to have bowel movements.  Keep anal area dry and clean.   Do not use a donut shaped pillow or sit on the toilet for long periods. This increases blood pooling and pain.   Move your bowels when your body has the urge; this will require less straining and will decrease pain and pressure.

## 2018-06-19 NOTE — Op Note (Signed)
[facility] Patient Name: David Dougherty Procedure Date: 06/19/2018 7:10 AM MRN: [id number] Date of Birth: 05/04/1951 Attending MD: Barney Drain MD, MD CSN: [id number] Age: 67 Admit Type: Outpatient Procedure:                Colonoscopy WITH COLD FORCEPS POLYPECTOMY Indications:              Hematochezia Providers:                Barney Drain MD, MD, Charlsie Quest. Joanne Gavel, RN,                            Randa Spike, Technician Referring MD:             Elayne Snare. Luking Medicines:                Propofol per Anesthesia Complications:            No immediate complications. Estimated Blood Loss:     Estimated blood loss was minimal. Procedure:                Pre-Anesthesia Assessment:                           - Prior to the procedure, a History and Physical                            was performed, and patient medications and                            allergies were reviewed. The patient's tolerance of                            previous anesthesia was also reviewed. The risks                            and benefits of the procedure and the sedation                            options and risks were discussed with the patient.                            All questions were answered, and informed consent                            was obtained. Prior Anticoagulants: The patient has                            taken aspirin, last dose was 7 days prior to                            procedure. ASA Grade Assessment: II - A patient                            with mild systemic disease. After reviewing the  risks and benefits, the patient was deemed in                            satisfactory condition to undergo the procedure.                            After obtaining informed consent, the colonoscope                            was passed under direct vision. Throughout the                            procedure, the patient's blood pressure, pulse, and                      oxygen saturations were monitored continuously. The                            CF-[id number] ([id number]) scope was introduced through                            the anus and advanced to the 7 cm into the ileum.                            The colonoscopy was somewhat difficult due to a                            tortuous colon. Successful completion of the                            procedure was aided by straightening and shortening                            the scope to obtain bowel loop reduction and                            COLOWRAP. The patient tolerated the procedure well.                            The quality of the bowel preparation was excellent.                            The terminal ileum, ileocecal valve, appendiceal                            orifice, and rectum were photographed. Scope In: 7:47:58 AM Scope Out: 7:59:29 AM Scope Withdrawal Time: 0 hours 9 minutes 10 seconds  Total Procedure Duration: 0 hours 11 minutes 31 seconds  Findings:      The terminal ileum appeared normal.      A 3 mm polyp was found in the rectum. The polyp was sessile. The polyp       was removed with a cold biopsy forceps. Resection and retrieval were       complete.      Internal  hemorrhoids were found. The hemorrhoids were small.      External hemorrhoids were found. The hemorrhoids were moderate. Impression:               - The examined portion of the ileum was normal.                           - One 3 mm polyp in the rectum, removed with a cold                            biopsy forceps. Resected and retrieved.                           - RECTAL BLEEDING DUE TO Internal hemorrhoids.                           - MODERATE External hemorrhoids. Moderate Sedation:      Per Anesthesia Care Recommendation:           - Repeat colonoscopy in 5-10 years for surveillance.                           - High fiber diet.                           - Continue present medications.                            - Await pathology results. SEE SURGERY TO CONSIDER                            HEMORRHOIDECTOMY OR CONTINUE MEDICAL MANAGEMENT                           - Return to my office in 4 months.                           - Patient has a contact number available for                            emergencies. The signs and symptoms of potential                            delayed complications were discussed with the                            patient. Return to normal activities tomorrow.                            Written discharge instructions were provided to the                            patient. Procedure Code(s):        --- Professional ---  45380, Colonoscopy, flexible; with biopsy, single                            or multiple Diagnosis Code(s):        --- Professional ---                           K64.4, Residual hemorrhoidal skin tags                           K64.8, Other hemorrhoids                           K62.1, Rectal polyp                           K92.1, Melena (includes Hematochezia) CPT copyright 2017 American Medical Association. All rights reserved. The codes documented in this report are preliminary and upon coder review may  be revised to meet current compliance requirements. Barney Drain, MD Barney Drain MD, MD 06/19/2018 8:49:50 AM This report has been signed electronically. Number of Addenda: 0

## 2018-06-19 NOTE — Anesthesia Procedure Notes (Signed)
Procedure Name: MAC Date/Time: 06/19/2018 7:30 AM Performed by: Vista Deck, CRNA Pre-anesthesia Checklist: Patient identified, Emergency Drugs available, Suction available, Timeout performed and Patient being monitored Patient Re-evaluated:Patient Re-evaluated prior to induction Oxygen Delivery Method: Nasal Cannula

## 2018-06-19 NOTE — Transfer of Care (Addendum)
Immediate Anesthesia Transfer of Care Note  Patient: David Dougherty  Procedure(s) Performed: COLONOSCOPY WITH PROPOFOL (N/A ) ESOPHAGOGASTRODUODENOSCOPY (EGD) WITH PROPOFOL (N/A ) SAVORY DILATION (Esophagus) POLYPECTOMY  Patient Location: PACU  Anesthesia Type: General  Level of Consciousness: awake and patient cooperative  Airway & Oxygen Therapy: Patient Spontanous Breathing  Post-op Assessment: Report given to RN and Post -op Vital signs reviewed and stable  Post vital signs: Reviewed and stable  Last Vitals:  Vitals Value Taken Time  BP    Temp    Pulse 84 06/19/2018  8:29 AM  Resp    SpO2 96 % 06/19/2018  8:29 AM  Vitals shown include unvalidated device data.  Last Pain:  Vitals:   06/19/18 0652  TempSrc: Oral  PainSc: 0-No pain      Patients Stated Pain Goal: 5 (19/37/90 2409)  Complications: No apparent anesthesia complications

## 2018-06-19 NOTE — H&P (Signed)
Primary Care Physician:  Kathyrn Drown, MD Primary Gastroenterologist:  Dr. Oneida Alar  Pre-Procedure History & Physical: HPI:  David Dougherty is a 67 y.o. male here for BRBPR/barrett's screening.   Past Medical History:  Diagnosis Date  . Atrial fibrillation ([place])   . CAD (coronary artery disease)    70% distal circumflex 11/2014   . GERD (gastroesophageal reflux disease)   . Nonischemic cardiomyopathy ([place])   . Tobacco abuse    Snuff    Past Surgical History:  Procedure Laterality Date  . APPENDECTOMY    . COLONOSCOPY  2005   Neg  . COLONOSCOPY N/A 01/05/2015   SLF:17 colon polyps removed/moderate sized hemorrhoids  . 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 HEMORRHOID BANDING N/A 01/05/2015   Procedure: HEMORRHOID BANDING;  Surgeon: Danie Binder, MD;  Location: [facility] SUITE;  Service: Endoscopy;  Laterality: N/A;  . LEFT AND RIGHT HEART CATHETERIZATION WITH CORONARY ANGIOGRAM N/A 12/03/2014   Procedure: LEFT AND RIGHT HEART CATHETERIZATION WITH CORONARY ANGIOGRAM;  Surgeon: Blane Ohara, MD;  Location: [facility] CATH LAB;  Service: Cardiovascular;  Laterality: N/A;  . TONSILLECTOMY      Prior to Admission medications   Medication Sig Start Date End Date Taking? Authorizing Provider  acetaminophen (TYLENOL) 500 MG tablet Take 500 mg by mouth every 6 (six) hours as needed for mild pain or headache.   Yes [provider]  aspirin EC 81 MG tablet Take 81 mg by mouth daily.   Yes [provider]  calcium carbonate (TUMS - DOSED IN MG ELEMENTAL CALCIUM) 500 MG chewable tablet Chew 1 tablet by mouth as needed for indigestion or heartburn.   Yes [provider]  carvedilol (COREG) 3.125 MG tablet Take 1 tablet (3.125 mg total) by mouth 2 (two) times daily. 12/14/17  Yes Kathyrn Drown, MD  cholecalciferol (VITAMIN D) 1000 units tablet Take 3,000  Units by mouth daily.   Yes [provider]  furosemide (LASIX) 20 MG tablet Take 1 tablet (20 mg total) by mouth daily. 12/14/17  Yes Kathyrn Drown, MD  HYDROcodone-acetaminophen (NORCO/VICODIN) 5-325 MG tablet Take 1 tablet by mouth every 4 (four) hours as needed. 12/14/17  Yes Kathyrn Drown, MD  losartan (COZAAR) 25 MG tablet Take 1 tablet (25 mg total) by mouth daily. 12/14/17  Yes Luking, Elayne Snare, MD  omeprazole (PRILOSEC) 20 MG capsule TAKE ONE CAPSULE BY MOUTH 30 MINUTES PRIOR TO BREAKFAST. 06/04/18  Yes Luking, Elayne Snare, MD  topiramate (TOPAMAX) 50 MG tablet Take 1 tablet (50 mg total) by mouth 2 (two) times daily. 12/14/17  Yes Luking, Elayne Snare, MD  nitroGLYCERIN (NITROSTAT) 0.4 MG SL tablet PLACE 1 TAB UNDER TONGUE EVERY 5 MIN IF NEEDED FOR CHEST PAIN. MAY USE 3 TIMES.NO RELIEF CALL 911. 02/14/17   Satira Sark, MD    Allergies as of 04/05/2018  . (No Known Allergies)    Family History  Problem Relation Age of Onset  . Heart attack Father   . Hypertension Father   . Diabetes Father   . Colon cancer Neg Hx   . Colon polyps Neg Hx     Social History   Socioeconomic History  . Marital status: Married    Spouse name: Not on file  . Number of children: Not on file  . Years of education: Not on file  . Highest education level: Not on file  Occupational History  .  Occupation: Camera operator  Social Needs  . Financial resource strain: Not on file  . Food insecurity:    Worry: Not on file    Inability: Not on file  . Transportation needs:    Medical: Not on file    Non-medical: Not on file  Tobacco Use  . Smoking status: Former Smoker    Packs/day: 0.25    Years: 3.00    Pack years: 0.75    Types: Cigars, Cigarettes    Start date: 10/19/1969    Last attempt to quit: 10/22/1972    Years since quitting: 45.6  . Smokeless tobacco: Former Systems developer    Types: Snuff  Substance and Sexual Activity  . Alcohol use: No    Alcohol/week: 0.0 oz  . Drug  use: No  . Sexual activity: Not on file  Lifestyle  . Physical activity:    Days per week: Not on file    Minutes per session: Not on file  . Stress: Not on file  Relationships  . Social connections:    Talks on phone: Not on file    Gets together: Not on file    Attends religious service: Not on file    Active member of club or organization: Not on file    Attends meetings of clubs or organizations: Not on file    Relationship status: Not on file  . Intimate partner violence:    Fear of current or ex partner: Not on file    Emotionally abused: Not on file    Physically abused: Not on file    Forced sexual activity: Not on file  Other Topics Concern  . Not on file  Social History Narrative   Married with 5 children   No regular exercise    Review of Systems: See HPI, otherwise negative ROS   Physical Exam: BP 132/86   Pulse 88   Temp 97.9 F (36.6 C) (Oral)   Resp 18   SpO2 95%  General:   Alert,  pleasant and cooperative in NAD Head:  Normocephalic and atraumatic. Neck:  Supple; Lungs:  Clear throughout to auscultation.    Heart:  Regular rate and rhythm. Abdomen:  Soft, nontender and nondistended. Normal bowel sounds, without guarding, and without rebound.   Neurologic:  Alert and  oriented x4;  grossly normal neurologically.  Impression/Plan:     BRBPR/barrett's screening  PLAN: EGD/TCS TODAY  DISCUSSED PROCEDURE, BENEFITS, AND RISKS.

## 2018-06-22 ENCOUNTER — Encounter (HOSPITAL_COMMUNITY): Payer: Self-pay | Admitting: Gastroenterology

## 2018-06-25 ENCOUNTER — Telehealth: Payer: Self-pay | Admitting: Gastroenterology

## 2018-06-25 NOTE — Telephone Encounter (Signed)
SCHEDULED AND ON RECALL  °

## 2018-06-25 NOTE — Telephone Encounter (Addendum)
Please call pt. He had ONE HYPERPLASTIC POLYP removed. HIS ESOPHAGUS BIOPSIES ARE NORMAL. HE DOES NOT HAVE BARRETT'S ON THE BIOPSY.   DRINK WATER TO KEEP YOUR URINE LIGHT YELLOW.  FOLLOW A HIGH FIBER DIET. AVOID ITEMS THAT CAUSE BLOATING.   CONTINUE OMEPRAZOLE.  TAKE 30 MINUTES PRIOR TO YOUR FIRST MEAL.  Follow up in 4 mos.  NEXT EGD/colonoscopy BETWEEN 2024 & 2026.

## 2018-06-25 NOTE — Telephone Encounter (Signed)
PT's wife Freda Munro is aware.

## 2018-07-03 ENCOUNTER — Other Ambulatory Visit: Payer: Self-pay | Admitting: Family Medicine

## 2018-07-05 ENCOUNTER — Other Ambulatory Visit: Payer: Self-pay

## 2018-07-09 ENCOUNTER — Ambulatory Visit: Payer: Medicare Other | Admitting: Nurse Practitioner

## 2018-07-17 ENCOUNTER — Encounter: Payer: Self-pay | Admitting: Gastroenterology

## 2018-07-30 ENCOUNTER — Other Ambulatory Visit: Payer: Self-pay | Admitting: Family Medicine

## 2018-09-07 ENCOUNTER — Other Ambulatory Visit: Payer: Self-pay | Admitting: Family Medicine

## 2018-10-03 ENCOUNTER — Other Ambulatory Visit: Payer: Self-pay | Admitting: Family Medicine

## 2018-10-04 NOTE — Telephone Encounter (Signed)
May have 1 month with 1 refill needs office visit

## 2018-10-08 ENCOUNTER — Other Ambulatory Visit: Payer: Self-pay | Admitting: Family Medicine

## 2018-11-12 ENCOUNTER — Other Ambulatory Visit: Payer: Self-pay | Admitting: Family Medicine

## 2018-11-20 ENCOUNTER — Ambulatory Visit: Payer: Medicare Other | Admitting: Nurse Practitioner

## 2018-12-01 ENCOUNTER — Other Ambulatory Visit: Payer: Self-pay | Admitting: Family Medicine

## 2018-12-10 ENCOUNTER — Other Ambulatory Visit: Payer: Self-pay | Admitting: Family Medicine

## 2018-12-31 ENCOUNTER — Other Ambulatory Visit: Payer: Self-pay | Admitting: Family Medicine

## 2019-01-04 ENCOUNTER — Encounter: Payer: Self-pay | Admitting: Family Medicine

## 2019-01-04 ENCOUNTER — Ambulatory Visit (INDEPENDENT_AMBULATORY_CARE_PROVIDER_SITE_OTHER): Payer: Medicare Other | Admitting: Family Medicine

## 2019-01-04 VITALS — BP 120/82 | Temp 98.7°F | Ht 73.0 in | Wt 214.6 lb

## 2019-01-04 DIAGNOSIS — J019 Acute sinusitis, unspecified: Secondary | ICD-10-CM

## 2019-01-04 DIAGNOSIS — B9689 Other specified bacterial agents as the cause of diseases classified elsewhere: Secondary | ICD-10-CM

## 2019-01-04 DIAGNOSIS — J181 Lobar pneumonia, unspecified organism: Secondary | ICD-10-CM | POA: Diagnosis not present

## 2019-01-04 DIAGNOSIS — J189 Pneumonia, unspecified organism: Secondary | ICD-10-CM

## 2019-01-04 MED ORDER — ALBUTEROL SULFATE HFA 108 (90 BASE) MCG/ACT IN AERS: 2.0000 | INHALATION_SPRAY | Freq: Four times a day (QID) | RESPIRATORY_TRACT | 2 refills | Status: DC | PRN

## 2019-01-04 MED ORDER — PREDNISONE 20 MG PO TABS: ORAL_TABLET | ORAL | 0 refills | Status: DC

## 2019-01-04 MED ORDER — CEFTRIAXONE SODIUM 1 G IJ SOLR
500.0000 mg | Freq: Once | INTRAMUSCULAR | Status: AC
  Administered 2019-01-04: 500 mg via INTRAMUSCULAR

## 2019-01-04 MED ORDER — AZITHROMYCIN 250 MG PO TABS: ORAL_TABLET | ORAL | 0 refills | Status: DC

## 2019-01-04 NOTE — Progress Notes (Signed)
   Subjective:    Patient ID: David Dougherty, male    DOB: September 27, 1951, 68 y.o.   MRN: [id number]  Cough  This is a new problem. The current episode started in the past 7 days. Associated symptoms include headaches, nasal congestion, rhinorrhea, a sore throat and wheezing. Pertinent negatives include no chest pain, chills, ear pain or fever. Associated symptoms comments: Abdominal pain. He has tried OTC cough suppressant for the symptoms.   Significant cough and a cold for 2 weeks increased chest congestion denies wheezing difficulty breathing denies high fever chills sweats   Review of Systems  Constitutional: Negative for activity change, chills and fever.  HENT: Positive for congestion, rhinorrhea and sore throat. Negative for ear pain.   Eyes: Negative for discharge.  Respiratory: Positive for cough and wheezing.   Cardiovascular: Negative for chest pain.  Gastrointestinal: Negative for nausea and vomiting.  Musculoskeletal: Negative for arthralgias.  Neurological: Positive for headaches.       Objective:   Physical Exam Vitals signs and nursing note reviewed.  Constitutional:      Appearance: He is well-developed.  HENT:     Head: Normocephalic.     Mouth/Throat:     Pharynx: No oropharyngeal exudate.  Neck:     Musculoskeletal: Normal range of motion.  Cardiovascular:     Rate and Rhythm: Normal rate and regular rhythm.     Heart sounds: Normal heart sounds. No murmur.  Pulmonary:     Effort: Pulmonary effort is normal.     Breath sounds: Wheezing present.  Lymphadenopathy:     Cervical: No cervical adenopathy.  Skin:    General: Skin is warm and dry.  Neurological:     Motor: No abnormal muscle tone.    Crackles are heard in her right middle lobe region.       Assessment & Plan:  Consistent with right middle acute rhinosinusitis shot of Rocephin along with antibiotics hold off on chest x-ray O2 saturation 96% if any increased difficulty breathing or  other problems to follow-up immediately.  Warning signs were discussed

## 2019-01-10 ENCOUNTER — Telehealth: Payer: Self-pay | Admitting: Family Medicine

## 2019-01-10 NOTE — Telephone Encounter (Signed)
Patient was seen 1/31 and still has a bad cough. Wife states gave him a breathing treatment last night seem to help a little but wants another round of antibiotics called in [place].

## 2019-01-11 ENCOUNTER — Other Ambulatory Visit: Payer: Self-pay | Admitting: *Deleted

## 2019-01-11 MED ORDER — CEFPROZIL 500 MG PO TABS: 500.0000 mg | ORAL_TABLET | Freq: Two times a day (BID) | ORAL | 0 refills | Status: DC

## 2019-01-11 NOTE — Telephone Encounter (Signed)
cefzil 500 bid ten d 

## 2019-01-11 NOTE — Telephone Encounter (Signed)
Med sent to pharm. Pt notified.  

## 2019-01-11 NOTE — Telephone Encounter (Signed)
Doing better since breathing treatments. Having to give treatments one at night and one in the morning. No fever. Coughing is better. Did not have to do treatment this morning.

## 2019-01-21 ENCOUNTER — Other Ambulatory Visit: Payer: Self-pay | Admitting: Family Medicine

## 2019-02-06 ENCOUNTER — Other Ambulatory Visit: Payer: Self-pay | Admitting: Family Medicine

## 2019-02-06 NOTE — Telephone Encounter (Signed)
May have 3 refills each needs office visit

## 2019-02-21 ENCOUNTER — Other Ambulatory Visit: Payer: Self-pay | Admitting: Family Medicine

## 2019-03-25 ENCOUNTER — Other Ambulatory Visit: Payer: Self-pay | Admitting: Family Medicine

## 2019-03-26 NOTE — Telephone Encounter (Signed)
Virtual visit 1 refill

## 2019-04-02 NOTE — Progress Notes (Signed)
REVIEWED-NO ADDITIONAL RECOMMENDATIONS. 

## 2019-04-09 ENCOUNTER — Other Ambulatory Visit: Payer: Self-pay | Admitting: Family Medicine

## 2019-04-11 NOTE — Telephone Encounter (Signed)
Pt has med check scheduled for 5/18.

## 2019-04-11 NOTE — Telephone Encounter (Signed)
Please connect with patient he needs a virtual follow-up visit Once this is scheduled then additional 1 month refill may be sent in

## 2019-04-11 NOTE — Telephone Encounter (Signed)
Left message to return call 

## 2019-04-22 ENCOUNTER — Other Ambulatory Visit: Payer: Self-pay

## 2019-04-22 ENCOUNTER — Ambulatory Visit (INDEPENDENT_AMBULATORY_CARE_PROVIDER_SITE_OTHER): Payer: Medicare Other | Admitting: Family Medicine

## 2019-04-22 ENCOUNTER — Encounter: Payer: Self-pay | Admitting: Family Medicine

## 2019-04-22 VITALS — Wt 205.0 lb

## 2019-04-22 DIAGNOSIS — I5022 Chronic systolic (congestive) heart failure: Secondary | ICD-10-CM

## 2019-04-22 DIAGNOSIS — I10 Essential (primary) hypertension: Secondary | ICD-10-CM | POA: Diagnosis not present

## 2019-04-22 DIAGNOSIS — I480 Paroxysmal atrial fibrillation: Secondary | ICD-10-CM

## 2019-04-22 DIAGNOSIS — I429 Cardiomyopathy, unspecified: Secondary | ICD-10-CM | POA: Diagnosis not present

## 2019-04-22 MED ORDER — OMEPRAZOLE 20 MG PO CPDR: DELAYED_RELEASE_CAPSULE | ORAL | 5 refills | Status: DC

## 2019-04-22 MED ORDER — HYDROCODONE-ACETAMINOPHEN 5-325 MG PO TABS: 1.0000 | ORAL_TABLET | ORAL | 0 refills | Status: DC | PRN

## 2019-04-22 MED ORDER — LOSARTAN POTASSIUM 25 MG PO TABS: 25.0000 mg | ORAL_TABLET | Freq: Every day | ORAL | 5 refills | Status: DC

## 2019-04-22 MED ORDER — CARVEDILOL 3.125 MG PO TABS: 3.1250 mg | ORAL_TABLET | Freq: Two times a day (BID) | ORAL | 6 refills | Status: DC

## 2019-04-22 MED ORDER — FUROSEMIDE 20 MG PO TABS: 20.0000 mg | ORAL_TABLET | Freq: Every day | ORAL | 5 refills | Status: DC

## 2019-04-22 NOTE — Progress Notes (Signed)
Subjective:    Patient ID: David Dougherty, male    DOB: [birth year], 68 y.o.   MRN: [id number]  Hypertension  This is a chronic problem. The current episode started more than 1 month ago. The problem is unchanged. The problem is controlled. Pertinent negatives include no anxiety, chest pain, headaches or shortness of breath. There are no associated agents to hypertension. There are no compliance problems.  There is no history of angina or heart failure.  Back Pain  This is a new problem. The current episode started 1 to 4 weeks ago. The problem occurs 2 to 4 times per day. The problem has been gradually worsening since onset. The pain is present in the lumbar spine. The quality of the pain is described as aching. The pain is at a severity of 4/10. The pain is moderate. The pain is the same all the time. The symptoms are aggravated by bending, lying down, sitting, standing and twisting. Pertinent negatives include no abdominal pain, chest pain, headaches, numbness, paresis or paresthesias. He has tried analgesics for the symptoms. The treatment provided no relief.   Pt is having some back pain. Started about one week ago. Pain is in the lower back. Pt has been taking Tylenol but no relief. No problems going to the bathroom. Pt believes it is due to sitting around.   Virtual Visit via Video Note  I connected with David Dougherty on 04/22/19 at  9:30 AM EDT by a video enabled telemedicine application and verified that I am speaking with the correct person using two identifiers.  Location: Patient: home  Provider: office    I discussed the limitations of evaluation and management by telemedicine and the availability of in person appointments. The patient expressed understanding and agreed to proceed.  History of Present Illness:    Observations/Objective:   Assessment and Plan:   Follow Up Instructions:    I discussed the assessment and treatment plan with the patient. The  patient was provided an opportunity to ask questions and all were answered. The patient agreed with the plan and demonstrated an understanding of the instructions.   The patient was advised to call back or seek an in-person evaluation if the symptoms worsen or if the condition fails to improve as anticipated.  I provided 17 minutes of non-face-to-face time during this encounter.   [name], LPN    Review of Systems  Constitutional: Negative for diaphoresis and fatigue.  HENT: Negative for congestion and rhinorrhea.   Respiratory: Negative for cough and shortness of breath.   Cardiovascular: Negative for chest pain and leg swelling.  Gastrointestinal: Negative for abdominal pain and diarrhea.  Musculoskeletal: Positive for back pain. Negative for arthralgias.  Skin: Negative for color change and rash.  Neurological: Negative for dizziness, numbness, headaches and paresthesias.  Psychiatric/Behavioral: Negative for behavioral problems and confusion.       Objective:   Physical Exam       Assessment & Plan:  HTN- Patient was seen today as part of a visit regarding hypertension. The importance of healthy diet and regular physical activity was discussed. The importance of compliance with medications discussed.  Ideal goal is to keep blood pressure low elevated levels certainly below 341/93 when possible.  The patient was counseled that keeping blood pressure under control lessen his risk of complications.  The importance of regular follow-ups was discussed with the patient.  Low-salt diet such as DASH recommended.  Regular physical activity was recommended as well.  Patient was advised to keep regular follow-ups.  Patient has history of cardiomyopathy.  States he is able to get around with some shortness of breath no chest tightness pressure pain he is taking his medication  Patient does have atrial fibrillation and is chronic he does not want to be on any type of  anticoagulants he has been talked to several times about this he does take 81 mg aspirin  Patient has chronic congestive heart failure stable currently no significant swelling in the legs.  Will be doing lab work in the near future.  Will continue his current medications we will follow-up by fall time  Low back pain for the past couple weeks we will send the patient stretching exercises to do if he does not get better over the next couple weeks he will let us know and we will proceed forward with imaging in the examination testing etc.  Chronic headaches gets these infrequently uses hydrocodone sparingly new prescription was sent caution drowsiness home use only

## 2019-06-18 ENCOUNTER — Other Ambulatory Visit: Payer: Self-pay | Admitting: Family Medicine

## 2019-06-24 DIAGNOSIS — I480 Paroxysmal atrial fibrillation: Secondary | ICD-10-CM | POA: Diagnosis not present

## 2019-06-24 DIAGNOSIS — I5022 Chronic systolic (congestive) heart failure: Secondary | ICD-10-CM | POA: Diagnosis not present

## 2019-06-24 DIAGNOSIS — I429 Cardiomyopathy, unspecified: Secondary | ICD-10-CM | POA: Diagnosis not present

## 2019-06-24 DIAGNOSIS — I10 Essential (primary) hypertension: Secondary | ICD-10-CM | POA: Diagnosis not present

## 2019-06-25 LAB — LIPID PANEL
Chol/HDL Ratio: 4.6 ratio (ref 0.0–5.0)
Cholesterol, Total: 180 mg/dL (ref 100–199)
HDL: 39 mg/dL — ABNORMAL LOW (ref >39)
LDL Calculated: 110 mg/dL — ABNORMAL HIGH (ref 0–99)
Triglycerides: 153 mg/dL — ABNORMAL HIGH (ref 0–149)
VLDL Cholesterol Cal: 31 mg/dL (ref 5–40)

## 2019-06-25 LAB — BASIC METABOLIC PANEL
BUN/Creatinine Ratio: 11 (ref 10–24)
BUN: 12 mg/dL (ref 8–27)
CO2: 19 mmol/L — ABNORMAL LOW (ref 20–29)
Calcium: 9.8 mg/dL (ref 8.6–10.2)
Chloride: 106 mmol/L (ref 96–106)
Creatinine, Ser: 1.08 mg/dL (ref 0.76–1.27)
GFR calc Af Amer: 81 mL/min/{1.73_m2} (ref >59)
GFR calc non Af Amer: 70 mL/min/{1.73_m2} (ref >59)
Glucose: 113 mg/dL — ABNORMAL HIGH (ref 65–99)
Potassium: 5.3 mmol/L — ABNORMAL HIGH (ref 3.5–5.2)
Sodium: 140 mmol/L (ref 134–144)

## 2019-06-25 LAB — PSA: Prostate Specific Ag, Serum: 2.9 ng/mL (ref 0.0–4.0)

## 2019-06-25 LAB — HEPATIC FUNCTION PANEL
ALT: 27 IU/L (ref 0–44)
AST: 22 IU/L (ref 0–40)
Albumin: 4.7 g/dL (ref 3.8–4.8)
Alkaline Phosphatase: 79 IU/L (ref 39–117)
Bilirubin Total: 0.5 mg/dL (ref 0.0–1.2)
Bilirubin, Direct: 0.13 mg/dL (ref 0.00–0.40)
Total Protein: 7.3 g/dL (ref 6.0–8.5)

## 2019-06-27 ENCOUNTER — Encounter: Payer: Self-pay | Admitting: Family Medicine

## 2019-10-17 ENCOUNTER — Other Ambulatory Visit: Payer: Self-pay | Admitting: *Deleted

## 2019-10-17 MED ORDER — OMEPRAZOLE 20 MG PO CPDR: DELAYED_RELEASE_CAPSULE | ORAL | 0 refills | Status: DC

## 2019-10-30 ENCOUNTER — Other Ambulatory Visit: Payer: Self-pay

## 2019-11-04 DIAGNOSIS — H25813 Combined forms of age-related cataract, bilateral: Secondary | ICD-10-CM | POA: Diagnosis not present

## 2019-11-08 ENCOUNTER — Telehealth: Payer: Self-pay | Admitting: Family Medicine

## 2019-11-08 NOTE — Telephone Encounter (Signed)
Pt contacted and verbalized understanding. Pt will call back to set up appointment after his eye surgery.

## 2019-11-08 NOTE — Telephone Encounter (Signed)
Pt needs to know what to do, he's not been taking his meds correctly & needs to know if he should be taking like the directions say  Wife states that the patient has been taking carvedilol (COREG) 3.125 MG tablet & topiramate (TOPAMAX) 50 MG tablet only once a day and directions state twice a day   Also wants to clarify what furosemide (LASIX) 20 MG tablet is for?   Please advise - (please see note on Rx from pharmacist dated today in [place])

## 2019-11-08 NOTE — Telephone Encounter (Signed)
As for the carvedilol I would recommend twice daily As for the Topamax once daily is fine As for the furosemide I recommend every morning as needed if having swelling in the legs Finally I would recommend that the patient do a follow-up visit either virtual or in person somewhere in the next 30 days

## 2019-11-08 NOTE — Telephone Encounter (Signed)
Please advise. Thank you

## 2019-11-11 DIAGNOSIS — H25812 Combined forms of age-related cataract, left eye: Secondary | ICD-10-CM | POA: Diagnosis not present

## 2019-11-12 NOTE — H&P (Signed)
Surgical History & Physical  Patient Name: David Dougherty DOB: Mar 05, 1951  Surgery: Cataract extraction with intraocular lens implant phacoemulsification; Left Eye  Surgeon: [name] MD Surgery Date:  11/18/2019 Pre-Op Date:  11/04/2019  HPI: A 3 Yr. old male patient 1. 1. The patient is here for a Cataract surgery, which VA concerns began since his last eye visit; about 2 months ago. Both eyes are affected but he feels like OS VA is worst. Symptoms occur when the patient is watching Tv, he is having to get really close to it in order to see it and driving at night has became really hard since the bright lights are bothering his OU, this is This is negatively affecting the patient's quality of life.. The complaint is associated with no additional symptoms. HPI was performed by [name] .  Medical History: Cataracts Metal drilled out of OD - 20-30 yrs ago Heart Problem High Blood Pressure Lung Problems  Review of Systems Negative Allergic/Immunologic Negative Cardiovascular Negative Constitutional Negative Ear, Nose, Mouth & Throat Negative Endocrine Negative Eyes Negative Gastrointestinal Negative Genitourinary Negative Hemotologic/Lymphatic Negative Integumentary Negative Musculoskeletal Negative Neurological Negative Psychiatry Negative Respiratory  Social   Never Smoked  Medication Acetaminophinen, Albuterol, Aspirin, Azithromycin, Calcium Supplement, Carvedilol, Vitamin D3, Furosemide, Losartan Potassium, Nitroglycerin, Omeprazole, PredMild, Topiramate,   Sx/Procedures  None  Drug Allergies   NKDA  History & Physical: Heent:  Cataract, Left eye NECK: supple without bruits LUNGS: lungs clear to auscultation CV: regular rate and rhythm Abdomen: soft and non-tender  Impression & Plan: Assessment: 1.  COMBINED FORMS AGE RELATED CATARACT; Both Eyes (H25.813)  Plan: 1.  Cataract accounts for the patient's decreased vision. This visual impairment  is not correctable with a tolerable change in glasses or contact lenses. Cataract surgery with an implantation of a new lens should significantly improve the visual and functional status of the patient. Discussed all risks, benefits, alternatives, and potential complications. Discussed the procedures and recovery. Patient desires to have surgery. A-scan ordered and performed today for intra-ocular lens calculations. The surgery will be performed in order to improve vision for driving, reading, and for eye examinations. Recommend phacoemulsification with intra-ocular lens. Left Eye. worse - first. Dilates well - shugarcaine by protocol.

## 2019-11-14 NOTE — Patient Instructions (Signed)
David Dougherty  11/14/2019     @PREFPERIOPPHARMACY @   Your procedure is scheduled on  11/18/2019 .  Report to [facility] at  [place].M.  Call this number if you have problems the morning of surgery:  [phone]   Remember:  Do not eat or drink after midnight.                       Take these medicines the morning of surgery with A SIP OF WATER  Carvedilol, prilosec, topamax.    Do not wear jewelry, make-up or nail polish.  Do not wear lotions, powders, or perfumes. Please wear deodorant and brush your teeth.  Do not shave 48 hours prior to surgery.  Men may shave face and neck.  Do not bring valuables to the hospital.  [facility] is not responsible for any belongings or valuables.  Contacts, dentures or bridgework may not be worn into surgery.  Leave your suitcase in the car.  After surgery it may be brought to your room.  For patients admitted to the hospital, discharge time will be determined by your treatment team.  Patients discharged the day of surgery will not be allowed to drive home.   Name and phone number of your driver:   family Special instructions:  None  Please read over the following fact sheets that you were given. Anesthesia Post-op Instructions and Care and Recovery After Surgery       Cataract Surgery, Care After This sheet gives you information about how to care for yourself after your procedure. Your health care provider may also give you more specific instructions. If you have problems or questions, contact your health care provider. What can I expect after the procedure? After the procedure, it is common to have:  Itching.  Discomfort.  Fluid discharge.  Sensitivity to light and to touch.  Bruising in or around the eye.  Mild blurred vision. Follow these instructions at home: Eye care   Do not touch or rub your eyes.  Protect your eyes as told by your health care provider. You may be told to wear a protective eye  shield or sunglasses.  Do not put a contact lens into the affected eye or eyes until your health care provider approves.  Keep the area around your eye clean and dry: ? Avoid swimming. ? Do not allow water to hit you directly in the face while showering. ? Keep soap and shampoo out of your eyes.  Check your eye every day for signs of infection. Watch for: ? Redness, swelling, or pain. ? Fluid, blood, or pus. ? Warmth. ? A bad smell. ? Vision that is getting worse. ? Sensitivity that is getting worse. Activity  Do not drive for 24 hours if you were given a sedative during your procedure.  Avoid strenuous activities, such as playing contact sports, for as long as told by your health care provider.  Do not drive or use heavy machinery until your health care provider approves.  Do not bend or lift heavy objects. Bending increases pressure in the eye. You can walk, climb stairs, and do light household chores.  Ask your health care provider when you can return to work. If you work in a dusty environment, you may be advised to wear protective eyewear for a period of time. General instructions  Take or apply over-the-counter and prescription medicines only as told by your health care provider.  This includes eye drops.  Keep all follow-up visits as told by your health care provider. This is important. Contact a health care provider if:  You have increased bruising around your eye.  You have pain that is not helped with medicine.  You have a fever.  You have redness, swelling, or pain in your eye.  You have fluid, blood, or pus coming from your incision.  Your vision gets worse.  Your sensitivity to light gets worse. Get help right away if:  You have sudden loss of vision.  You see flashes of light or spots (floaters).  You have severe eye pain.  You develop nausea or vomiting. Summary  After your procedure, it is common to have itching, discomfort, bruising, fluid  discharge, or sensitivity to light.  Follow instructions from your health care provider about caring for your eye after the procedure.  Do not rub your eye after the procedure. You may need to wear eye protection or sunglasses. Do not wear contact lenses. Keep the area around your eye clean and dry.  Avoid activities that require a lot of effort. These include playing sports and lifting heavy objects.  Contact a health care provider if you have increased bruising, pain that does not go away, or a fever. Get help right away if you suddenly lose your vision, see flashes of light or spots, or have severe pain in the eye. This information is not intended to replace advice given to you by your health care provider. Make sure you discuss any questions you have with your health care provider. Document Released: 06/10/2005 Document Revised: 05/21/2018 Document Reviewed: 05/21/2018 Elsevier Patient Education  2020 [place] After These instructions provide you with information about caring for yourself after your procedure. Your health care provider may also give you more specific instructions. Your treatment has been planned according to current medical practices, but problems sometimes occur. Call your health care provider if you have any problems or questions after your procedure. What can I expect after the procedure? After your procedure, you may:  Feel sleepy for several hours.  Feel clumsy and have poor balance for several hours.  Feel forgetful about what happened after the procedure.  Have poor judgment for several hours.  Feel nauseous or vomit.  Have a sore throat if you had a breathing tube during the procedure. Follow these instructions at home: For at least 24 hours after the procedure:      Have a responsible adult stay with you. It is important to have someone help care for you until you are awake and alert.  Rest as needed.  Do  not: ? Participate in activities in which you could fall or become injured. ? Drive. ? Use heavy machinery. ? Drink alcohol. ? Take sleeping pills or medicines that cause drowsiness. ? Make important decisions or sign legal documents. ? Take care of children on your own. Eating and drinking  Follow the diet that is recommended by your health care provider.  If you vomit, drink water, juice, or soup when you can drink without vomiting.  Make sure you have little or no nausea before eating solid foods. General instructions  Take over-the-counter and prescription medicines only as told by your health care provider.  If you have sleep apnea, surgery and certain medicines can increase your risk for breathing problems. Follow instructions from your health care provider about wearing your sleep device: ? Anytime you are sleeping, including during daytime naps. ?  While taking prescription pain medicines, sleeping medicines, or medicines that make you drowsy.  If you smoke, do not smoke without supervision.  Keep all follow-up visits as told by your health care provider. This is important. Contact a health care provider if:  You keep feeling nauseous or you keep vomiting.  You feel light-headed.  You develop a rash.  You have a fever. Get help right away if:  You have trouble breathing. Summary  For several hours after your procedure, you may feel sleepy and have poor judgment.  Have a responsible adult stay with you for at least 24 hours or until you are awake and alert. This information is not intended to replace advice given to you by your health care provider. Make sure you discuss any questions you have with your health care provider. Document Released: 03/13/2016 Document Revised: 02/19/2018 Document Reviewed: 03/13/2016 Elsevier Patient Education  2020 [employer].

## 2019-11-15 ENCOUNTER — Other Ambulatory Visit: Payer: Self-pay

## 2019-11-15 ENCOUNTER — Other Ambulatory Visit: Payer: Self-pay | Admitting: Family Medicine

## 2019-11-15 ENCOUNTER — Encounter (HOSPITAL_COMMUNITY)
Admission: RE | Admit: 2019-11-15 | Discharge: 2019-11-15 | Disposition: A | Discharge status: Home / Self Care | Payer: Medicare Other | Source: Ambulatory Visit | Attending: Ophthalmology | Admitting: Ophthalmology

## 2019-11-15 ENCOUNTER — Encounter (HOSPITAL_COMMUNITY): Payer: Self-pay

## 2019-11-15 ENCOUNTER — Other Ambulatory Visit (HOSPITAL_COMMUNITY)
Admission: RE | Admit: 2019-11-15 | Discharge: 2019-11-15 | Disposition: A | Discharge status: Home / Self Care | Payer: Medicare Other | Source: Ambulatory Visit | Attending: Ophthalmology | Admitting: Ophthalmology

## 2019-11-15 DIAGNOSIS — Z20828 Contact with and (suspected) exposure to other viral communicable diseases: Secondary | ICD-10-CM | POA: Insufficient documentation

## 2019-11-15 DIAGNOSIS — Z01812 Encounter for preprocedural laboratory examination: Secondary | ICD-10-CM | POA: Diagnosis not present

## 2019-11-15 HISTORY — DX: Cardiac arrhythmia, unspecified: I49.9

## 2019-11-15 LAB — BASIC METABOLIC PANEL
Anion gap: 9 (ref 5–15)
BUN: 23 mg/dL (ref 8–23)
CO2: 20 mmol/L — ABNORMAL LOW (ref 22–32)
Calcium: 8.9 mg/dL (ref 8.9–10.3)
Chloride: 109 mmol/L (ref 98–111)
Creatinine, Ser: 1.06 mg/dL (ref 0.61–1.24)
GFR calc Af Amer: >60 mL/min (ref >60)
GFR calc non Af Amer: >60 mL/min (ref >60)
Glucose, Bld: 106 mg/dL — ABNORMAL HIGH (ref 70–99)
Potassium: 3.5 mmol/L (ref 3.5–5.1)
Sodium: 138 mmol/L (ref 135–145)

## 2019-11-15 LAB — SARS CORONAVIRUS 2 (TAT 6-24 HRS): SARS Coronavirus 2: NEGATIVE

## 2019-11-18 ENCOUNTER — Ambulatory Visit (HOSPITAL_COMMUNITY)
Admission: RE | Admit: 2019-11-18 | Discharge: 2019-11-18 | Disposition: A | Discharge status: Home / Self Care | Payer: Medicare Other | Attending: Ophthalmology | Admitting: Ophthalmology

## 2019-11-18 ENCOUNTER — Other Ambulatory Visit: Payer: Self-pay

## 2019-11-18 ENCOUNTER — Other Ambulatory Visit: Payer: Self-pay | Admitting: Family Medicine

## 2019-11-18 ENCOUNTER — Ambulatory Visit (HOSPITAL_COMMUNITY): Payer: Medicare Other | Admitting: Anesthesiology

## 2019-11-18 ENCOUNTER — Encounter (HOSPITAL_COMMUNITY): Payer: Self-pay | Admitting: Ophthalmology

## 2019-11-18 ENCOUNTER — Encounter (HOSPITAL_COMMUNITY)
Admission: RE | Disposition: A | Discharge status: Home / Self Care | Payer: Self-pay | Source: Home / Self Care | Attending: Ophthalmology

## 2019-11-18 DIAGNOSIS — I2510 Atherosclerotic heart disease of native coronary artery without angina pectoris: Secondary | ICD-10-CM | POA: Insufficient documentation

## 2019-11-18 DIAGNOSIS — Z79899 Other long term (current) drug therapy: Secondary | ICD-10-CM | POA: Diagnosis not present

## 2019-11-18 DIAGNOSIS — I509 Heart failure, unspecified: Secondary | ICD-10-CM | POA: Insufficient documentation

## 2019-11-18 DIAGNOSIS — H25813 Combined forms of age-related cataract, bilateral: Secondary | ICD-10-CM | POA: Insufficient documentation

## 2019-11-18 DIAGNOSIS — Z7982 Long term (current) use of aspirin: Secondary | ICD-10-CM | POA: Insufficient documentation

## 2019-11-18 DIAGNOSIS — K219 Gastro-esophageal reflux disease without esophagitis: Secondary | ICD-10-CM | POA: Insufficient documentation

## 2019-11-18 DIAGNOSIS — I110 Hypertensive heart disease with heart failure: Secondary | ICD-10-CM | POA: Insufficient documentation

## 2019-11-18 DIAGNOSIS — H25812 Combined forms of age-related cataract, left eye: Secondary | ICD-10-CM | POA: Diagnosis not present

## 2019-11-18 HISTORY — PX: CATARACT EXTRACTION W/PHACO: SHX586

## 2019-11-18 SURGERY — PHACOEMULSIFICATION, CATARACT, WITH IOL INSERTION
Anesthesia: Monitor Anesthesia Care | Site: Eye | Laterality: Left

## 2019-11-18 MED ORDER — BSS IO SOLN
INTRAOCULAR | Status: DC | PRN
  Administered 2019-11-18: 15 mL via INTRAOCULAR

## 2019-11-18 MED ORDER — SODIUM HYALURONATE 23 MG/ML IO SOLN
INTRAOCULAR | Status: DC | PRN
  Administered 2019-11-18: 0.6 mL via INTRAOCULAR

## 2019-11-18 MED ORDER — LIDOCAINE HCL (PF) 1 % IJ SOLN
INTRAOCULAR | Status: DC | PRN
  Administered 2019-11-18: 1 mL via OPHTHALMIC

## 2019-11-18 MED ORDER — POVIDONE-IODINE 5 % OP SOLN
OPHTHALMIC | Status: DC | PRN
  Administered 2019-11-18: 1 via OPHTHALMIC

## 2019-11-18 MED ORDER — LACTATED RINGERS IV SOLN: INTRAVENOUS | Status: DC

## 2019-11-18 MED ORDER — EPINEPHRINE PF 1 MG/ML IJ SOLN
INTRAOCULAR | Status: DC | PRN
  Administered 2019-11-18: 500 mL

## 2019-11-18 MED ORDER — TETRACAINE HCL 0.5 % OP SOLN
1.0000 [drp] | OPHTHALMIC | Status: AC | PRN
  Administered 2019-11-18 (×3): 1 [drp] via OPHTHALMIC

## 2019-11-18 MED ORDER — CYCLOPENTOLATE-PHENYLEPHRINE 0.2-1 % OP SOLN
1.0000 [drp] | OPHTHALMIC | Status: AC | PRN
  Administered 2019-11-18 (×3): 1 [drp] via OPHTHALMIC

## 2019-11-18 MED ORDER — EPINEPHRINE PF 1 MG/ML IJ SOLN
INTRAMUSCULAR | Status: AC
  Filled 2019-11-18: qty 2

## 2019-11-18 MED ORDER — NEOMYCIN-POLYMYXIN-DEXAMETH 3.5-10000-0.1 OP SUSP
OPHTHALMIC | Status: DC | PRN
  Administered 2019-11-18: 1 [drp] via OPHTHALMIC

## 2019-11-18 MED ORDER — PROVISC 10 MG/ML IO SOLN
INTRAOCULAR | Status: DC | PRN
  Administered 2019-11-18: 0.85 mL via INTRAOCULAR

## 2019-11-18 MED ORDER — PHENYLEPHRINE HCL 2.5 % OP SOLN
1.0000 [drp] | OPHTHALMIC | Status: AC | PRN
  Administered 2019-11-18 (×3): 1 [drp] via OPHTHALMIC

## 2019-11-18 MED ORDER — LIDOCAINE HCL 3.5 % OP GEL
1.0000 "application " | Freq: Once | OPHTHALMIC | Status: AC
  Administered 2019-11-18: 1 via OPHTHALMIC

## 2019-11-18 SURGICAL SUPPLY — 16 items
CLOTH BEACON ORANGE TIMEOUT ST (SAFETY) ×2 IMPLANT
DEVICE MILOOP (MISCELLANEOUS) IMPLANT
EYE SHIELD UNIVERSAL CLEAR (GAUZE/BANDAGES/DRESSINGS) ×2 IMPLANT
GLOVE BIOGEL PI IND STRL 7.0 (GLOVE) ×2 IMPLANT
GLOVE BIOGEL PI INDICATOR 7.0 (GLOVE) ×2
LENS ALC ACRYL/TECN (Ophthalmic Related) ×2 IMPLANT
MILOOP DEVICE (MISCELLANEOUS)
NEEDLE HYPO 18GX1.5 BLUNT FILL (NEEDLE) ×2 IMPLANT
PAD ARMBOARD 7.5X6 YLW CONV (MISCELLANEOUS) ×2 IMPLANT
RING MALYGIN (MISCELLANEOUS) IMPLANT
RING MALYGIN 7.0 (MISCELLANEOUS) IMPLANT
SYR TB 1ML LL NO SAFETY (SYRINGE) ×2 IMPLANT
TAPE SURG TRANSPORE 1 IN (GAUZE/BANDAGES/DRESSINGS) ×1 IMPLANT
TAPE SURGICAL TRANSPORE 1 IN (GAUZE/BANDAGES/DRESSINGS) ×1
VISCOELASTIC ADDITIONAL (OPHTHALMIC RELATED) ×2 IMPLANT
WATER STERILE IRR 250ML POUR (IV SOLUTION) ×2 IMPLANT

## 2019-11-18 NOTE — Anesthesia Preprocedure Evaluation (Signed)
Anesthesia Evaluation  Patient identified by MRN, date of birth, ID band Patient awake    Reviewed: Allergy & Precautions, NPO status , Patient's Chart, lab work & pertinent test results, reviewed documented beta blocker date and time   Airway Mallampati: II  TM Distance: >3 FB Neck ROM: Full    Dental no notable dental hx. (+) Upper Dentures   Pulmonary neg pulmonary ROS, former smoker,    Pulmonary exam normal breath sounds clear to auscultation       Cardiovascular Exercise Tolerance: Good hypertension, Pt. on medications and Pt. on home beta blockers + CAD and +CHF  Normal cardiovascular exam+ dysrhythmias Atrial Fibrillation I Rhythm:Regular Rate:Normal     Neuro/Psych negative neurological ROS  negative psych ROS   GI/Hepatic Neg liver ROS, GERD  Medicated and Controlled,  Endo/Other  negative endocrine ROS  Renal/GU negative Renal ROS  negative genitourinary   Musculoskeletal negative musculoskeletal ROS (+)   Abdominal   Peds negative pediatric ROS (+)  Hematology negative hematology ROS (+)   Anesthesia Other Findings   Reproductive/Obstetrics negative OB ROS                             Anesthesia Physical Anesthesia Plan  ASA: III  Anesthesia Plan: MAC   Post-op Pain Management:    Induction: Intravenous  PONV Risk Score and Plan: 1 and Propofol infusion, TIVA and Treatment may vary due to age or medical condition  Airway Management Planned: Nasal Cannula and Simple Face Mask  Additional Equipment:   Intra-op Plan:   Post-operative Plan:   Informed Consent: I have reviewed the patients History and Physical, chart, labs and discussed the procedure including the risks, benefits and alternatives for the proposed anesthesia with the patient or authorized representative who has indicated his/her understanding and acceptance.     Dental advisory given  Plan  Discussed with: CRNA  Anesthesia Plan Comments: (Plan Full PPE use  Plan MAC d/w pt -WTP with same after Q&A)        Anesthesia Quick Evaluation

## 2019-11-18 NOTE — Telephone Encounter (Signed)
Scheduled for 12/17 virtual

## 2019-11-18 NOTE — Discharge Instructions (Signed)
Please discharge patient when stable, will follow up today with Dr. Makynlee Kressin at the [facility] office immediately following discharge.  Leave shield in place until visit.  All paperwork with discharge instructions will be given at the office. ° °

## 2019-11-18 NOTE — Telephone Encounter (Signed)
Last med check up 04/22/19

## 2019-11-18 NOTE — Telephone Encounter (Signed)
Patient needs to do a virtual follow-up May have 3 refills

## 2019-11-18 NOTE — Op Note (Signed)
Date of procedure: 11/18/19  Pre-operative diagnosis: Visually significant age-related combined cataract, Left Eye (H25.812)  Post-operative diagnosis: Visually significant age-related combined cataract, Left Eye (H25.812)  Procedure: Removal of cataract via phacoemulsification and insertion of intra-ocular lens Wynetta Emery and [employer] DCB00  +20.5D into the capsular bag of the Left Eye  Attending surgeon: Gerda Diss. Bryella Diviney, MD, MA  Anesthesia: MAC, Topical Akten  Complications: None  Estimated Blood Loss: <16m (minimal)  Specimens: None  Implants: As above  Indications:  Visually significant age-related cataract, Left Eye  Procedure:  The patient was seen and identified in the pre-operative area. The operative eye was identified and dilated.  The operative eye was marked.  Topical anesthesia was administered to the operative eye.     The patient was then to the operative suite and placed in the supine position.  A timeout was performed confirming the patient, procedure to be performed, and all other relevant information.   The patient's face was prepped and draped in the usual fashion for intra-ocular surgery.  A lid speculum was placed into the operative eye and the surgical microscope moved into place and focused.  An inferotemporal paracentesis was created using a 20 gauge paracentesis blade.  Shugarcaine was injected into the anterior chamber.  Viscoelastic was injected into the anterior chamber.  A temporal clear-corneal main wound incision was created using a 2.471mmicrokeratome.  A continuous curvilinear capsulorrhexis was initiated using an irrigating cystitome and completed using capsulorrhexis forceps.  Hydrodissection and hydrodeliniation were performed.  Viscoelastic was injected into the anterior chamber.  A phacoemulsification handpiece and a chopper as a second instrument were used to remove the nucleus and epinucleus. The irrigation/aspiration handpiece was used to remove  any remaining cortical material.   The capsular bag was reinflated with viscoelastic, checked, and found to be intact.  The intraocular lens was inserted into the capsular bag.  The irrigation/aspiration handpiece was used to remove any remaining viscoelastic.  The clear corneal wound and paracentesis wounds were then hydrated and checked with Weck-Cels to be watertight.  The lid-speculum and drape was removed, and the patient's face was cleaned with a wet and dry 4x4.  Maxitrol was instilled in the eye before a clear shield was taped over the eye. The patient was taken to the post-operative care unit in good condition, having tolerated the procedure well.  Post-Op Instructions: [facility] and will receive all other orders and instructions.

## 2019-11-18 NOTE — Telephone Encounter (Signed)
Pleases schedule and then route back to nurses

## 2019-11-18 NOTE — Transfer of Care (Signed)
Immediate Anesthesia Transfer of Care Note  Patient: David Dougherty  Procedure(s) Performed: CATARACT EXTRACTION PHACO AND INTRAOCULAR LENS PLACEMENT (IOC) (Left Eye)  Patient Location: PACU  Anesthesia Type:General  Level of Consciousness: awake  Airway & Oxygen Therapy: Patient Spontanous Breathing  Post-op Assessment: Report given to RN  Post vital signs: Reviewed and stable  Last Vitals:  Vitals Value Taken Time  BP    Temp    Pulse    Resp    SpO2      Last Pain:  Vitals:   11/18/19 0929  TempSrc: Oral  PainSc: 0-No pain         Complications: No apparent anesthesia complications

## 2019-11-18 NOTE — Anesthesia Postprocedure Evaluation (Signed)
Anesthesia Post Note  Patient: David Dougherty  Procedure(s) Performed: CATARACT EXTRACTION PHACO AND INTRAOCULAR LENS PLACEMENT (IOC) (Left Eye)  Patient location during evaluation: Short Stay Anesthesia Type: MAC Level of consciousness: awake and alert and oriented Pain management: pain level controlled Vital Signs Assessment: post-procedure vital signs reviewed and stable Respiratory status: spontaneous breathing Cardiovascular status: blood pressure returned to baseline and stable Postop Assessment: no apparent nausea or vomiting Anesthetic complications: no     Last Vitals:  Vitals:   11/18/19 0929  BP: 134/80  Pulse: 84  Resp: 16  Temp: 36.6 C  SpO2: 98%    Last Pain:  Vitals:   11/18/19 0929  TempSrc: Oral  PainSc: 0-No pain                 Maddy Graham

## 2019-11-21 ENCOUNTER — Ambulatory Visit (INDEPENDENT_AMBULATORY_CARE_PROVIDER_SITE_OTHER): Payer: Medicare Other | Admitting: Family Medicine

## 2019-11-21 VITALS — BP 134/84

## 2019-11-21 DIAGNOSIS — I429 Cardiomyopathy, unspecified: Secondary | ICD-10-CM

## 2019-11-21 DIAGNOSIS — I10 Essential (primary) hypertension: Secondary | ICD-10-CM | POA: Diagnosis not present

## 2019-11-21 DIAGNOSIS — I480 Paroxysmal atrial fibrillation: Secondary | ICD-10-CM

## 2019-11-21 MED ORDER — TOPIRAMATE 50 MG PO TABS: 50.0000 mg | ORAL_TABLET | Freq: Two times a day (BID) | ORAL | 5 refills | Status: DC

## 2019-11-21 MED ORDER — FUROSEMIDE 20 MG PO TABS: 20.0000 mg | ORAL_TABLET | Freq: Every day | ORAL | 5 refills | Status: DC

## 2019-11-21 MED ORDER — LOSARTAN POTASSIUM 25 MG PO TABS: 25.0000 mg | ORAL_TABLET | Freq: Every day | ORAL | 5 refills | Status: DC

## 2019-11-21 MED ORDER — OMEPRAZOLE 20 MG PO CPDR: DELAYED_RELEASE_CAPSULE | ORAL | 5 refills | Status: DC

## 2019-11-21 MED ORDER — CARVEDILOL 3.125 MG PO TABS: 3.1250 mg | ORAL_TABLET | Freq: Two times a day (BID) | ORAL | 5 refills | Status: DC

## 2019-11-21 NOTE — Progress Notes (Signed)
   Subjective:    Patient ID: David Dougherty, male    DOB: 1951-08-11, 68 y.o.   MRN: [id number]  Hypertension This is a chronic problem. Pertinent negatives include no chest pain, headaches or shortness of breath. There are no compliance problems.   pt states he does have the means to check blood pressure at home.  No issues or concerns. Patient does have underlying heart disease including cardiomyopathy atrial fibrillation and heart failure under good control currently denies any swelling shortness of breathPatient is not on any type of blood thinners currently does not want to be on blood thinner we have talked with them about stroke prevention unfortunately he does not want to be on any type of anticoagulant Virtual Visit via Telephone Note  I connected with [place] on 11/21/19 at  8:30 AM EST by telephone and verified that I am speaking with the correct person using two identifiers.  Location: Patient: home Provider: office   I discussed the limitations, risks, security and privacy concerns of performing an evaluation and management service by telephone and the availability of in person appointments. I also discussed with the patient that there may be a patient responsible charge related to this service. The patient expressed understanding and agreed to proceed.   History of Present Illness:    Observations/Objective:   Assessment and Plan:   Follow Up Instructions:    I discussed the assessment and treatment plan with the patient. The patient was provided an opportunity to ask questions and all were answered. The patient agreed with the plan and demonstrated an understanding of the instructions.   The patient was advised to call back or seek an in-person evaluation if the symptoms worsen or if the condition fails to improve as anticipated.  I provided 16 minutes of non-face-to-face time during this encounter.   [name], LPN     Review of Systems   Constitutional: Negative for diaphoresis and fatigue.  HENT: Negative for congestion and rhinorrhea.   Respiratory: Negative for cough and shortness of breath.   Cardiovascular: Negative for chest pain and leg swelling.  Gastrointestinal: Negative for abdominal pain and diarrhea.  Skin: Negative for color change and rash.  Neurological: Negative for dizziness and headaches.  Psychiatric/Behavioral: Negative for behavioral problems and confusion.  Patient refuses flu shot     Objective:   Physical Exam Today's visit was via telephone Physical exam was not possible for this visit        Assessment & Plan:  1. 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 Essential hypertension Blood pressure good control.  Patient had recent met 7 he will do comprehensive lab work and visit in the spring

## 2019-11-26 NOTE — H&P (Signed)
Surgical History & Physical  Patient Name: David Dougherty DOB: [birth year]  Surgery: Cataract extraction with intraocular lens implant phacoemulsification; Right Eye  Surgeon: [name] MD Surgery Date:  12/04/2019 Pre-Op Date:  11/25/2019  HPI: A 53 Yr. old male patient 1. 1. The patient complains of difficulty when viewing TV, reading closed caption, news scrolls on TV, which began 2 months ago. The right eye is affected. The episode is gradual. The condition's severity increased since last visit. Symptoms occur when the patient is inside and outside. This is negatively affecting the patient's quality of life. 2. The patient is returning after cataract post-op. The left eye is affected. Status post cataract post-op, 1 week ago: Since the last visit, the affected area feels improvement. The patient's vision is improved. Patient is following medication instructions. C/O something like trash in left eye. HPI was performed by [name] .  Medical History: Cataracts Metal drilled out of OD - 20-30 yrs ago Heart Problem High Blood Pressure Lung Problems  Review of Systems Negative Allergic/Immunologic Negative Cardiovascular Negative Constitutional Negative Ear, Nose, Mouth & Throat Negative Endocrine Negative Eyes Negative Gastrointestinal Negative Genitourinary Negative Hemotologic/Lymphatic Negative Integumentary Negative Musculoskeletal Negative Neurological Negative Psychiatry Negative Respiratory  Social   Never Smoked  Medication Prednisolone-gatiflox-bromfenac,  Acetaminophinen, Albuterol, Aspirin, Azithromycin, Calcium Supplement, Carvedilol, Vitamin D3, Furosemide, Losartan Potassium, Nitroglycerin, Omeprazole, PredMild, Topiramate,   Sx/Procedures Phaco c IOL OS,   Drug Allergies   NKDA  History & Physical: Heent:  Cataract, Right eye NECK: supple without bruits LUNGS: lungs clear to auscultation CV: regular rate and rhythm Abdomen: soft and  non-tender  Impression & Plan: Assessment: 1.  CATARACT EXTRACTION STATUS; Left Eye (Z98.42) 2.  COMBINED FORMS AGE RELATED CATARACT; Right Eye (H25.811)  Plan: 1.  1 week after cataract surgery. Doing well with improved vision and normal eye pressure. Call with any problems or concerns. Continue Gati-Brom-Pred 2x/day for 3 more weeks. 2.  Cataract accounts for the patient's decreased vision. This visual impairment is not correctable with a tolerable change in glasses or contact lenses. Cataract surgery with an implantation of a new lens should significantly improve the visual and functional status of the patient. Discussed all risks, benefits, alternatives, and potential complications. Discussed the procedures and recovery. Patient desires to have surgery. A-scan ordered and performed today for intra-ocular lens calculations. The surgery will be performed in order to improve vision for driving, reading, and for eye examinations. Recommend phacoemulsification with intra-ocular lens. Right Eye. Surgery required to correct imbalance of vision. Dilates well - shugarcaine by protocol.

## 2019-11-27 ENCOUNTER — Encounter (HOSPITAL_COMMUNITY): Payer: Self-pay

## 2019-11-27 ENCOUNTER — Encounter (HOSPITAL_COMMUNITY)
Admit: 2019-11-27 | Discharge: 2019-11-27 | Disposition: A | Discharge status: Home / Self Care | Payer: Medicare Other | Attending: Ophthalmology | Admitting: Ophthalmology

## 2019-11-27 ENCOUNTER — Other Ambulatory Visit: Payer: Self-pay

## 2019-12-02 ENCOUNTER — Other Ambulatory Visit (HOSPITAL_COMMUNITY)
Admit: 2019-12-02 | Discharge: 2019-12-02 | Disposition: A | Discharge status: Home / Self Care | Payer: Medicare Other | Source: Ambulatory Visit | Attending: Ophthalmology | Admitting: Ophthalmology

## 2019-12-02 ENCOUNTER — Other Ambulatory Visit: Payer: Self-pay

## 2019-12-02 DIAGNOSIS — Z01812 Encounter for preprocedural laboratory examination: Secondary | ICD-10-CM | POA: Diagnosis present

## 2019-12-02 DIAGNOSIS — Z20828 Contact with and (suspected) exposure to other viral communicable diseases: Secondary | ICD-10-CM | POA: Diagnosis not present

## 2019-12-03 LAB — SARS CORONAVIRUS 2 (TAT 6-24 HRS): SARS Coronavirus 2: NEGATIVE

## 2019-12-03 MED ORDER — HYDROMORPHONE HCL 1 MG/ML IJ SOLN: 0.2500 mg | INTRAMUSCULAR | Status: DC | PRN

## 2019-12-03 MED ORDER — MIDAZOLAM HCL 2 MG/2ML IJ SOLN: 0.5000 mg | Freq: Once | INTRAMUSCULAR | Status: DC | PRN

## 2019-12-03 MED ORDER — HYDROCODONE-ACETAMINOPHEN 7.5-325 MG PO TABS: 1.0000 | ORAL_TABLET | Freq: Once | ORAL | Status: DC | PRN

## 2019-12-03 MED ORDER — PROMETHAZINE HCL 25 MG/ML IJ SOLN: 6.2500 mg | INTRAMUSCULAR | Status: DC | PRN

## 2019-12-04 ENCOUNTER — Other Ambulatory Visit: Payer: Self-pay

## 2019-12-04 ENCOUNTER — Encounter (HOSPITAL_COMMUNITY): Payer: Self-pay | Admitting: Ophthalmology

## 2019-12-04 ENCOUNTER — Encounter (HOSPITAL_COMMUNITY)
Admission: RE | Disposition: A | Discharge status: Home / Self Care | Payer: Self-pay | Source: Home / Self Care | Attending: Ophthalmology

## 2019-12-04 ENCOUNTER — Ambulatory Visit (HOSPITAL_COMMUNITY): Payer: Medicare Other | Admitting: Anesthesiology

## 2019-12-04 ENCOUNTER — Ambulatory Visit (HOSPITAL_COMMUNITY)
Admission: RE | Admit: 2019-12-04 | Discharge: 2019-12-04 | Disposition: A | Discharge status: Home / Self Care | Payer: Medicare Other | Attending: Ophthalmology | Admitting: Ophthalmology

## 2019-12-04 DIAGNOSIS — Z79899 Other long term (current) drug therapy: Secondary | ICD-10-CM | POA: Insufficient documentation

## 2019-12-04 DIAGNOSIS — I2510 Atherosclerotic heart disease of native coronary artery without angina pectoris: Secondary | ICD-10-CM | POA: Insufficient documentation

## 2019-12-04 DIAGNOSIS — K219 Gastro-esophageal reflux disease without esophagitis: Secondary | ICD-10-CM | POA: Insufficient documentation

## 2019-12-04 DIAGNOSIS — I110 Hypertensive heart disease with heart failure: Secondary | ICD-10-CM | POA: Diagnosis not present

## 2019-12-04 DIAGNOSIS — H25811 Combined forms of age-related cataract, right eye: Secondary | ICD-10-CM | POA: Diagnosis not present

## 2019-12-04 DIAGNOSIS — I4891 Unspecified atrial fibrillation: Secondary | ICD-10-CM | POA: Diagnosis not present

## 2019-12-04 DIAGNOSIS — Z7982 Long term (current) use of aspirin: Secondary | ICD-10-CM | POA: Insufficient documentation

## 2019-12-04 DIAGNOSIS — I509 Heart failure, unspecified: Secondary | ICD-10-CM | POA: Diagnosis not present

## 2019-12-04 DIAGNOSIS — Z87891 Personal history of nicotine dependence: Secondary | ICD-10-CM | POA: Insufficient documentation

## 2019-12-04 DIAGNOSIS — Z9842 Cataract extraction status, left eye: Secondary | ICD-10-CM | POA: Diagnosis not present

## 2019-12-04 HISTORY — PX: CATARACT EXTRACTION W/PHACO: SHX586

## 2019-12-04 SURGERY — PHACOEMULSIFICATION, CATARACT, WITH IOL INSERTION
Anesthesia: Monitor Anesthesia Care | Site: Eye | Laterality: Right

## 2019-12-04 MED ORDER — POVIDONE-IODINE 5 % OP SOLN
OPHTHALMIC | Status: DC | PRN
  Administered 2019-12-04: 1 via OPHTHALMIC

## 2019-12-04 MED ORDER — EPINEPHRINE PF 1 MG/ML IJ SOLN
INTRAMUSCULAR | Status: AC
  Filled 2019-12-04: qty 2

## 2019-12-04 MED ORDER — SODIUM HYALURONATE 23 MG/ML IO SOLN
INTRAOCULAR | Status: DC | PRN
  Administered 2019-12-04: 0.6 mL via INTRAOCULAR

## 2019-12-04 MED ORDER — BSS IO SOLN
INTRAOCULAR | Status: DC | PRN
  Administered 2019-12-04: 15 mL via INTRAOCULAR

## 2019-12-04 MED ORDER — NEOMYCIN-POLYMYXIN-DEXAMETH 3.5-10000-0.1 OP SUSP
OPHTHALMIC | Status: DC | PRN
  Administered 2019-12-04: 1 [drp] via OPHTHALMIC

## 2019-12-04 MED ORDER — PHENYLEPHRINE HCL 2.5 % OP SOLN
1.0000 [drp] | OPHTHALMIC | Status: AC | PRN
  Administered 2019-12-04 (×3): 1 [drp] via OPHTHALMIC

## 2019-12-04 MED ORDER — CYCLOPENTOLATE-PHENYLEPHRINE 0.2-1 % OP SOLN
1.0000 [drp] | OPHTHALMIC | Status: AC | PRN
  Administered 2019-12-04 (×3): 1 [drp] via OPHTHALMIC

## 2019-12-04 MED ORDER — LIDOCAINE HCL 3.5 % OP GEL
1.0000 "application " | Freq: Once | OPHTHALMIC | Status: AC
  Administered 2019-12-04: 1 via OPHTHALMIC

## 2019-12-04 MED ORDER — EPINEPHRINE PF 1 MG/ML IJ SOLN
INTRAOCULAR | Status: DC | PRN
  Administered 2019-12-04: 09:00:00 500 mL

## 2019-12-04 MED ORDER — LIDOCAINE HCL (PF) 1 % IJ SOLN
INTRAOCULAR | Status: DC | PRN
  Administered 2019-12-04: 1 mL via OPHTHALMIC

## 2019-12-04 MED ORDER — PROVISC 10 MG/ML IO SOLN
INTRAOCULAR | Status: DC | PRN
  Administered 2019-12-04: 0.85 mL via INTRAOCULAR

## 2019-12-04 MED ORDER — TETRACAINE HCL 0.5 % OP SOLN
1.0000 [drp] | OPHTHALMIC | Status: AC | PRN
  Administered 2019-12-04 (×3): 1 [drp] via OPHTHALMIC

## 2019-12-04 SURGICAL SUPPLY — 16 items
CLOTH BEACON ORANGE TIMEOUT ST (SAFETY) ×2 IMPLANT
DEVICE MILOOP (MISCELLANEOUS) IMPLANT
EYE SHIELD UNIVERSAL CLEAR (GAUZE/BANDAGES/DRESSINGS) ×2 IMPLANT
GLOVE BIOGEL PI IND STRL 7.0 (GLOVE) ×2 IMPLANT
GLOVE BIOGEL PI INDICATOR 7.0 (GLOVE) ×2
LENS ALC ACRYL/TECN (Ophthalmic Related) ×2 IMPLANT
MILOOP DEVICE (MISCELLANEOUS)
NEEDLE HYPO 18GX1.5 BLUNT FILL (NEEDLE) ×2 IMPLANT
PAD ARMBOARD 7.5X6 YLW CONV (MISCELLANEOUS) ×2 IMPLANT
RING MALYGIN (MISCELLANEOUS) IMPLANT
RING MALYGIN 7.0 (MISCELLANEOUS) IMPLANT
SYR TB 1ML LL NO SAFETY (SYRINGE) ×2 IMPLANT
TAPE SURG TRANSPORE 1 IN (GAUZE/BANDAGES/DRESSINGS) ×1 IMPLANT
TAPE SURGICAL TRANSPORE 1 IN (GAUZE/BANDAGES/DRESSINGS) ×1
VISCOELASTIC ADDITIONAL (OPHTHALMIC RELATED) ×2 IMPLANT
WATER STERILE IRR 250ML POUR (IV SOLUTION) ×2 IMPLANT

## 2019-12-04 NOTE — Op Note (Signed)
Date of procedure: 12/04/19  Pre-operative diagnosis:  Visually significant combined form age-related cataract, Right Eye (H25.811)  Post-operative diagnosis:  Visually significant combined form age-related cataract, Right Eye (H25.811)  Procedure: Removal of cataract via phacoemulsification and insertion of intra-ocular lens Wynetta Emery and [employer] DCB00  +20.0D into the capsular bag of the Right Eye  Attending surgeon: Gerda Diss. Adisa Vigeant, MD, MA  Anesthesia: MAC, Topical Akten  Complications: None  Estimated Blood Loss: <71m (minimal)  Specimens: None  Implants: As above  Indications:  Visually significant age-related cataract, Right Eye  Procedure:  The patient was seen and identified in the pre-operative area. The operative eye was identified and dilated.  The operative eye was marked.  Topical anesthesia was administered to the operative eye.     The patient was then to the operative suite and placed in the supine position.  A timeout was performed confirming the patient, procedure to be performed, and all other relevant information.   The patient's face was prepped and draped in the usual fashion for intra-ocular surgery.  A lid speculum was placed into the operative eye and the surgical microscope moved into place and focused.  A superotemporal paracentesis was created using a 20 gauge paracentesis blade.  Shugarcaine was injected into the anterior chamber.  Viscoelastic was injected into the anterior chamber.  A temporal clear-corneal main wound incision was created using a 2.436mmicrokeratome.  A continuous curvilinear capsulorrhexis was initiated using an irrigating cystitome and completed using capsulorrhexis forceps.  Hydrodissection and hydrodeliniation were performed.  Viscoelastic was injected into the anterior chamber.  A phacoemulsification handpiece and a chopper as a second instrument were used to remove the nucleus and epinucleus. The irrigation/aspiration handpiece was  used to remove any remaining cortical material.   The capsular bag was reinflated with viscoelastic, checked, and found to be intact.  The intraocular lens was inserted into the capsular bag.  The irrigation/aspiration handpiece was used to remove any remaining viscoelastic.  The clear corneal wound and paracentesis wounds were then hydrated and checked with Weck-Cels to be watertight.  The lid-speculum and drape was removed, and the patient's face was cleaned with a wet and dry 4x4.  Maxitrol was instilled in the eye before a clear shield was taped over the eye. The patient was taken to the post-operative care unit in good condition, having tolerated the procedure well.  Post-Op Instructions: [facility] and will receive all other orders and instructions.

## 2019-12-04 NOTE — Transfer of Care (Signed)
Immediate Anesthesia Transfer of Care Note  Patient: David Dougherty  Procedure(s) Performed: CATARACT EXTRACTION PHACO AND INTRAOCULAR LENS PLACEMENT RIGHT EYE (CDE: 4.56) (Right Eye)  Patient Location: Short Stay  Anesthesia Type:MAC  Level of Consciousness: awake, alert , oriented and patient cooperative  Airway & Oxygen Therapy: Patient Spontanous Breathing  Post-op Assessment: Report given to RN and Post -op Vital signs reviewed and stable  Post vital signs: Reviewed and stable  Last Vitals:  Vitals Value Taken Time  BP    Temp    Pulse    Resp    SpO2      Last Pain:  Vitals:   12/04/19 0749  TempSrc: Oral  PainSc: 0-No pain         Complications: No apparent anesthesia complications

## 2019-12-04 NOTE — Interval H&P Note (Signed)
History and Physical Interval Note: The H and P was reviewed and updated. The patient was examined.  No changes were found after exam.  The surgical eye was marked.  12/04/2019 8:53 AM  David Dougherty  has presented today for surgery, with the diagnosis of Nuclear sclerotic cataract - Right eye.  The various methods of treatment have been discussed with the patient and family. After consideration of risks, benefits and other options for treatment, the patient has consented to  Procedure(s) with comments: CATARACT EXTRACTION PHACO AND INTRAOCULAR LENS PLACEMENT RIGHT EYE (Right) - right as a surgical intervention.  The patient's history has been reviewed, patient examined, no change in status, stable for surgery.  I have reviewed the patient's chart and labs.  Questions were answered to the patient's satisfaction.     [name]

## 2019-12-04 NOTE — Anesthesia Preprocedure Evaluation (Signed)
Anesthesia Evaluation  Patient identified by MRN, date of birth, ID band Patient awake    Reviewed: Allergy & Precautions, NPO status , Patient's Chart, lab work & pertinent test results, reviewed documented beta blocker date and time   Airway Mallampati: II  TM Distance: >3 FB Neck ROM: Full    Dental  (+) Upper Dentures   Pulmonary former smoker,    Pulmonary exam normal breath sounds clear to auscultation       Cardiovascular hypertension, Pt. on medications and Pt. on home beta blockers + CAD and +CHF  + dysrhythmias Atrial Fibrillation  Rhythm:Irregular Rate:Normal - Systolic murmurs, - Diastolic murmurs, - Friction Rub, - Carotid Bruit, - Peripheral Edema and - Systolic Click [id number] 01:74:94 [facility]-OPS ROUTINE RECORD Atrial fibrillation Left axis deviation Abnormal ECG Confirmed by Asencion Noble [phone]) on 06/15/2018 8:29:49 PM   Neuro/Psych negative psych ROS   GI/Hepatic Neg liver ROS, GERD  Medicated,  Endo/Other  negative endocrine ROS  Renal/GU negative Renal ROS     Musculoskeletal  (+) Arthritis , Osteoarthritis,    Abdominal   Peds  Hematology negative hematology ROS (+)   Anesthesia Other Findings   Reproductive/Obstetrics                             Anesthesia Physical Anesthesia Plan  ASA: III  Anesthesia Plan: MAC   Post-op Pain Management:    Induction:   PONV Risk Score and Plan:   Airway Management Planned: Nasal Cannula and Natural Airway  Additional Equipment:   Intra-op Plan:   Post-operative Plan:   Informed Consent: I have reviewed the patients History and Physical, chart, labs and discussed the procedure including the risks, benefits and alternatives for the proposed anesthesia with the patient or authorized representative who has indicated his/her understanding and acceptance.       Plan Discussed with:  CRNA  Anesthesia Plan Comments:         Anesthesia Quick Evaluation

## 2019-12-04 NOTE — Discharge Instructions (Signed)
Please discharge patient when stable, will follow up today with Dr. Adarius Tigges at the [facility] [place] office immediately following discharge.  Leave shield in place until visit.  All paperwork with discharge instructions will be given at the office.  [facility] Address:  [address]  [place], [place] [address]  

## 2019-12-04 NOTE — Anesthesia Postprocedure Evaluation (Signed)
Anesthesia Post Note  Patient: David Dougherty  Procedure(s) Performed: CATARACT EXTRACTION PHACO AND INTRAOCULAR LENS PLACEMENT RIGHT EYE (CDE: 4.56) (Right Eye)  Patient location during evaluation: Short Stay Anesthesia Type: MAC Level of consciousness: awake and alert Pain management: pain level controlled Vital Signs Assessment: post-procedure vital signs reviewed and stable Respiratory status: spontaneous breathing Cardiovascular status: stable Postop Assessment: no apparent nausea or vomiting Anesthetic complications: no     Last Vitals:  Vitals:   12/04/19 0749  BP: 125/83  Pulse: 82  Resp: 16  Temp: 36.9 C  SpO2: 98%    Last Pain:  Vitals:   12/04/19 0749  TempSrc: Oral  PainSc: 0-No pain                 Everette Rank

## 2020-01-07 ENCOUNTER — Telehealth: Payer: Self-pay | Admitting: Family Medicine

## 2020-01-07 NOTE — Telephone Encounter (Signed)
Patient cant remember what vitamins D you told him to take over the counter and how many a day. Please advise

## 2020-01-07 NOTE — Telephone Encounter (Signed)
I looked at last vit d result note and at that time dr scott recommend otc vit d 1000 iu daily and I notified pt

## 2020-03-09 DIAGNOSIS — Z23 Encounter for immunization: Secondary | ICD-10-CM | POA: Diagnosis not present

## 2020-04-15 ENCOUNTER — Emergency Department (HOSPITAL_COMMUNITY): Payer: Medicare Other

## 2020-04-15 ENCOUNTER — Other Ambulatory Visit: Payer: Self-pay

## 2020-04-15 ENCOUNTER — Inpatient Hospital Stay (HOSPITAL_COMMUNITY)
Admission: EM | Admit: 2020-04-15 | Discharge: 2020-04-17 | DRG: 065 | Disposition: A | Discharge status: Home / Self Care | Payer: Medicare Other | Attending: Internal Medicine | Admitting: Internal Medicine

## 2020-04-15 ENCOUNTER — Observation Stay (HOSPITAL_COMMUNITY): Payer: Medicare Other

## 2020-04-15 ENCOUNTER — Encounter (HOSPITAL_COMMUNITY): Payer: Self-pay | Admitting: Emergency Medicine

## 2020-04-15 DIAGNOSIS — I6381 Other cerebral infarction due to occlusion or stenosis of small artery: Principal | ICD-10-CM | POA: Diagnosis present

## 2020-04-15 DIAGNOSIS — Z20822 Contact with and (suspected) exposure to covid-19: Secondary | ICD-10-CM | POA: Diagnosis not present

## 2020-04-15 DIAGNOSIS — K219 Gastro-esophageal reflux disease without esophagitis: Secondary | ICD-10-CM | POA: Diagnosis present

## 2020-04-15 DIAGNOSIS — R270 Ataxia, unspecified: Secondary | ICD-10-CM | POA: Diagnosis present

## 2020-04-15 DIAGNOSIS — Z5329 Procedure and treatment not carried out because of patient's decision for other reasons: Secondary | ICD-10-CM | POA: Diagnosis not present

## 2020-04-15 DIAGNOSIS — I428 Other cardiomyopathies: Secondary | ICD-10-CM | POA: Diagnosis not present

## 2020-04-15 DIAGNOSIS — I6523 Occlusion and stenosis of bilateral carotid arteries: Secondary | ICD-10-CM | POA: Diagnosis present

## 2020-04-15 DIAGNOSIS — R471 Dysarthria and anarthria: Secondary | ICD-10-CM | POA: Diagnosis present

## 2020-04-15 DIAGNOSIS — I4821 Permanent atrial fibrillation: Secondary | ICD-10-CM | POA: Diagnosis present

## 2020-04-15 DIAGNOSIS — Z7982 Long term (current) use of aspirin: Secondary | ICD-10-CM

## 2020-04-15 DIAGNOSIS — Z79899 Other long term (current) drug therapy: Secondary | ICD-10-CM

## 2020-04-15 DIAGNOSIS — I5022 Chronic systolic (congestive) heart failure: Secondary | ICD-10-CM | POA: Diagnosis present

## 2020-04-15 DIAGNOSIS — G8194 Hemiplegia, unspecified affecting left nondominant side: Secondary | ICD-10-CM | POA: Diagnosis not present

## 2020-04-15 DIAGNOSIS — R519 Headache, unspecified: Secondary | ICD-10-CM | POA: Diagnosis present

## 2020-04-15 DIAGNOSIS — E785 Hyperlipidemia, unspecified: Secondary | ICD-10-CM | POA: Diagnosis present

## 2020-04-15 DIAGNOSIS — R29818 Other symptoms and signs involving the nervous system: Secondary | ICD-10-CM | POA: Diagnosis not present

## 2020-04-15 DIAGNOSIS — R4781 Slurred speech: Secondary | ICD-10-CM | POA: Diagnosis not present

## 2020-04-15 DIAGNOSIS — Z8249 Family history of ischemic heart disease and other diseases of the circulatory system: Secondary | ICD-10-CM

## 2020-04-15 DIAGNOSIS — H538 Other visual disturbances: Secondary | ICD-10-CM | POA: Diagnosis present

## 2020-04-15 DIAGNOSIS — I4891 Unspecified atrial fibrillation: Secondary | ICD-10-CM | POA: Diagnosis present

## 2020-04-15 DIAGNOSIS — I6502 Occlusion and stenosis of left vertebral artery: Secondary | ICD-10-CM | POA: Diagnosis present

## 2020-04-15 DIAGNOSIS — I482 Chronic atrial fibrillation, unspecified: Secondary | ICD-10-CM | POA: Diagnosis not present

## 2020-04-15 DIAGNOSIS — R4702 Dysphasia: Secondary | ICD-10-CM | POA: Diagnosis present

## 2020-04-15 DIAGNOSIS — I110 Hypertensive heart disease with heart failure: Secondary | ICD-10-CM | POA: Diagnosis present

## 2020-04-15 DIAGNOSIS — R2990 Unspecified symptoms and signs involving the nervous system: Secondary | ICD-10-CM

## 2020-04-15 DIAGNOSIS — R29810 Facial weakness: Secondary | ICD-10-CM | POA: Diagnosis present

## 2020-04-15 DIAGNOSIS — Z833 Family history of diabetes mellitus: Secondary | ICD-10-CM

## 2020-04-15 DIAGNOSIS — I639 Cerebral infarction, unspecified: Secondary | ICD-10-CM | POA: Diagnosis not present

## 2020-04-15 DIAGNOSIS — R29702 NIHSS score 2: Secondary | ICD-10-CM | POA: Diagnosis present

## 2020-04-15 DIAGNOSIS — I2510 Atherosclerotic heart disease of native coronary artery without angina pectoris: Secondary | ICD-10-CM | POA: Diagnosis present

## 2020-04-15 DIAGNOSIS — E559 Vitamin D deficiency, unspecified: Secondary | ICD-10-CM | POA: Diagnosis present

## 2020-04-15 DIAGNOSIS — Z87891 Personal history of nicotine dependence: Secondary | ICD-10-CM

## 2020-04-15 DIAGNOSIS — Z888 Allergy status to other drugs, medicaments and biological substances status: Secondary | ICD-10-CM

## 2020-04-15 LAB — URINALYSIS, ROUTINE W REFLEX MICROSCOPIC
Bilirubin Urine: NEGATIVE
Glucose, UA: NEGATIVE mg/dL
Hgb urine dipstick: NEGATIVE
Ketones, ur: NEGATIVE mg/dL
Leukocytes,Ua: NEGATIVE
Nitrite: NEGATIVE
Protein, ur: NEGATIVE mg/dL
Specific Gravity, Urine: 1.019 (ref 1.005–1.030)
pH: 7 (ref 5.0–8.0)

## 2020-04-15 LAB — APTT: aPTT: 30 seconds (ref 24–36)

## 2020-04-15 LAB — DIFFERENTIAL
Abs Immature Granulocytes: 0.01 10*3/uL (ref 0.00–0.07)
Basophils Absolute: 0 10*3/uL (ref 0.0–0.1)
Basophils Relative: 0 %
Eosinophils Absolute: 0.2 10*3/uL (ref 0.0–0.5)
Eosinophils Relative: 4 %
Immature Granulocytes: 0 %
Lymphocytes Relative: 32 %
Lymphs Abs: 1.6 10*3/uL (ref 0.7–4.0)
Monocytes Absolute: 0.4 10*3/uL (ref 0.1–1.0)
Monocytes Relative: 7 %
Neutro Abs: 2.8 10*3/uL (ref 1.7–7.7)
Neutrophils Relative %: 57 %

## 2020-04-15 LAB — CBC
HCT: 46.3 % (ref 39.0–52.0)
Hemoglobin: 14.9 g/dL (ref 13.0–17.0)
MCH: 29.6 pg (ref 26.0–34.0)
MCHC: 32.2 g/dL (ref 30.0–36.0)
MCV: 91.9 fL (ref 80.0–100.0)
Platelets: 202 10*3/uL (ref 150–400)
RBC: 5.04 MIL/uL (ref 4.22–5.81)
RDW: 12.8 % (ref 11.5–15.5)
WBC: 5 10*3/uL (ref 4.0–10.5)
nRBC: 0 % (ref 0.0–0.2)

## 2020-04-15 LAB — COMPREHENSIVE METABOLIC PANEL
ALT: 20 U/L (ref 0–44)
AST: 20 U/L (ref 15–41)
Albumin: 4.3 g/dL (ref 3.5–5.0)
Alkaline Phosphatase: 72 U/L (ref 38–126)
Anion gap: 8 (ref 5–15)
BUN: 16 mg/dL (ref 8–23)
CO2: 23 mmol/L (ref 22–32)
Calcium: 8.8 mg/dL — ABNORMAL LOW (ref 8.9–10.3)
Chloride: 108 mmol/L (ref 98–111)
Creatinine, Ser: 1.06 mg/dL (ref 0.61–1.24)
GFR calc Af Amer: >60 mL/min (ref >60)
GFR calc non Af Amer: >60 mL/min (ref >60)
Glucose, Bld: 95 mg/dL (ref 70–99)
Potassium: 3.7 mmol/L (ref 3.5–5.1)
Sodium: 139 mmol/L (ref 135–145)
Total Bilirubin: 0.7 mg/dL (ref 0.3–1.2)
Total Protein: 7.5 g/dL (ref 6.5–8.1)

## 2020-04-15 LAB — RAPID URINE DRUG SCREEN, HOSP PERFORMED
Amphetamines: NOT DETECTED
Barbiturates: NOT DETECTED
Benzodiazepines: NOT DETECTED
Cocaine: NOT DETECTED
Opiates: NOT DETECTED
Tetrahydrocannabinol: NOT DETECTED

## 2020-04-15 LAB — SARS CORONAVIRUS 2 BY RT PCR (HOSPITAL ORDER, PERFORMED IN ~~LOC~~ HOSPITAL LAB): SARS Coronavirus 2: NEGATIVE

## 2020-04-15 LAB — CBG MONITORING, ED: Glucose-Capillary: 112 mg/dL — ABNORMAL HIGH (ref 70–99)

## 2020-04-15 LAB — I-STAT CHEM 8, ED
BUN: 15 mg/dL (ref 8–23)
Calcium, Ion: 1.22 mmol/L (ref 1.15–1.40)
Chloride: 109 mmol/L (ref 98–111)
Creatinine, Ser: 1 mg/dL (ref 0.61–1.24)
Glucose, Bld: 89 mg/dL (ref 70–99)
HCT: 47 % (ref 39.0–52.0)
Hemoglobin: 16 g/dL (ref 13.0–17.0)
Potassium: 3.8 mmol/L (ref 3.5–5.1)
Sodium: 142 mmol/L (ref 135–145)
TCO2: 22 mmol/L (ref 22–32)

## 2020-04-15 LAB — PROTIME-INR
INR: 1 (ref 0.8–1.2)
Prothrombin Time: 12.8 seconds (ref 11.4–15.2)

## 2020-04-15 LAB — ETHANOL: Alcohol, Ethyl (B): <10 mg/dL (ref <10)

## 2020-04-15 MED ORDER — SODIUM CHLORIDE 0.9 % IV SOLN: INTRAVENOUS | Status: DC

## 2020-04-15 MED ORDER — FUROSEMIDE 20 MG PO TABS
20.0000 mg | ORAL_TABLET | Freq: Every day | ORAL | Status: DC
  Administered 2020-04-15 – 2020-04-16 (×2): 20 mg via ORAL
  Filled 2020-04-15 (×3): qty 1

## 2020-04-15 MED ORDER — ASPIRIN EC 325 MG PO TBEC
325.0000 mg | DELAYED_RELEASE_TABLET | Freq: Every day | ORAL | Status: DC
  Administered 2020-04-15 – 2020-04-17 (×3): 325 mg via ORAL
  Filled 2020-04-15 (×3): qty 1

## 2020-04-15 MED ORDER — ALTEPLASE 100 MG IV SOLR
INTRAVENOUS | Status: AC
  Filled 2020-04-15: qty 100

## 2020-04-15 MED ORDER — STROKE: EARLY STAGES OF RECOVERY BOOK
Freq: Once | Status: DC
  Filled 2020-04-15: qty 1

## 2020-04-15 MED ORDER — VITAMIN D 25 MCG (1000 UNIT) PO TABS
15000.0000 [IU] | ORAL_TABLET | Freq: Every day | ORAL | Status: DC
  Filled 2020-04-15 (×2): qty 15

## 2020-04-15 MED ORDER — SENNOSIDES-DOCUSATE SODIUM 8.6-50 MG PO TABS
1.0000 | ORAL_TABLET | Freq: Every evening | ORAL | Status: DC | PRN
  Filled 2020-04-15: qty 1

## 2020-04-15 MED ORDER — IOHEXOL 350 MG/ML SOLN
100.0000 mL | Freq: Once | INTRAVENOUS | Status: AC | PRN
  Administered 2020-04-15: 100 mL via INTRAVENOUS

## 2020-04-15 MED ORDER — TOPIRAMATE 25 MG PO TABS
50.0000 mg | ORAL_TABLET | Freq: Two times a day (BID) | ORAL | Status: DC
  Administered 2020-04-15 – 2020-04-17 (×4): 50 mg via ORAL
  Filled 2020-04-15 (×4): qty 2

## 2020-04-15 MED ORDER — HYDRALAZINE HCL 20 MG/ML IJ SOLN: 10.0000 mg | Freq: Four times a day (QID) | INTRAMUSCULAR | Status: DC | PRN

## 2020-04-15 MED ORDER — ACETAMINOPHEN 160 MG/5ML PO SOLN: 650.0000 mg | ORAL | Status: DC | PRN

## 2020-04-15 MED ORDER — PANTOPRAZOLE SODIUM 40 MG PO TBEC
40.0000 mg | DELAYED_RELEASE_TABLET | Freq: Every day | ORAL | Status: DC
  Administered 2020-04-15 – 2020-04-17 (×3): 40 mg via ORAL
  Filled 2020-04-15 (×3): qty 1

## 2020-04-15 MED ORDER — ENOXAPARIN SODIUM 40 MG/0.4ML ~~LOC~~ SOLN
40.0000 mg | SUBCUTANEOUS | Status: DC
  Administered 2020-04-15 – 2020-04-16 (×2): 40 mg via SUBCUTANEOUS
  Filled 2020-04-15 (×2): qty 0.4

## 2020-04-15 MED ORDER — ACETAMINOPHEN 650 MG RE SUPP: 650.0000 mg | RECTAL | Status: DC | PRN

## 2020-04-15 MED ORDER — CALCIUM CARBONATE ANTACID 500 MG PO CHEW: 2.0000 | CHEWABLE_TABLET | ORAL | Status: DC | PRN

## 2020-04-15 MED ORDER — ACETAMINOPHEN 325 MG PO TABS: 650.0000 mg | ORAL_TABLET | ORAL | Status: DC | PRN

## 2020-04-15 NOTE — ED Notes (Addendum)
Teleneuro MD discussing TPA at this time. Pt continues to decide against TPA at this time.

## 2020-04-15 NOTE — ED Provider Notes (Signed)
Emergency Department Provider Note   I have reviewed the triage vital signs and the nursing notes.   HISTORY  Chief Complaint Weakness   HPI David Dougherty is a 69 y.o. male with PMH of A-fib, CAD, GERD, and nonischemic cardiomyopathy presents to the emergency department for evaluation of acute onset speech difficulty with lightheadedness and facial droop.  Last seen normal at 12:40 PM.  Patient arrives by private vehicle.  He tells me that he was out speaking with friends when he got lightheaded and almost fell.  Friend saw that his face was asymmetric and they could not understand what he was saying.  He sat down and then when he got up to try and walk he states he was having to walk sideways.  He feels like he is weak in both legs and does not appreciate a weakness in one versus the other.  He continues to feel that his speech is different for him and his wife, at bedside, corroborates this. No vision change. No fever, chills, or other URI symptoms.   Past Medical History:  Diagnosis Date   Atrial fibrillation ([place])    CAD (coronary artery disease)    70% distal circumflex 11/2014    Dysrhythmia    AFib   GERD (gastroesophageal reflux disease)    Nonischemic cardiomyopathy ([place])    Tobacco abuse    Snuff    Patient Active Problem List   Diagnosis Date Noted   Left hemiparesis ([place]) 04/15/2020   History of colonic polyps 04/05/2018   Vitamin D deficiency 01/12/2017   Osteopenia 11/03/2016   Cervical pain 11/25/2015   Cervical spondylosis without myelopathy 11/25/2015   Hemorrhoids 06/02/2015   Loose stools 06/02/2015   Chest pain 05/06/2015   H/O cardiomyopathy 05/06/2015   Essential hypertension 0000000   Chronic systolic congestive heart failure ([place]) 05/06/2015   Barrett's esophagus    CAD (coronary artery disease), native coronary artery 12/22/2014   Rectal bleeding 12/18/2014   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   Elevated  PSA 11/20/2014   Hyperlipidemia 11/20/2014   Elevated fasting glucose 11/20/2014   Atrial fibrillation ([place]) 08/12/2010   GASTROESOPHAGEAL REFLUX DISEASE 08/12/2010    Past Surgical History:  Procedure Laterality Date   APPENDECTOMY     CATARACT EXTRACTION W/PHACO Left 11/18/2019   Procedure: CATARACT EXTRACTION PHACO AND INTRAOCULAR LENS PLACEMENT ([employer]);  Surgeon: [name], MD;  Location: [facility];  Service: Ophthalmology;  Laterality: Left;  CDE: 9.66   CATARACT EXTRACTION W/PHACO Right 12/04/2019   Procedure: CATARACT EXTRACTION PHACO AND INTRAOCULAR LENS PLACEMENT RIGHT EYE (CDE: 4.56);  Surgeon: [name], MD;  Location: [facility];  Service: Ophthalmology;  Laterality: Right;   COLONOSCOPY  2005   Neg   COLONOSCOPY N/A 01/05/2015   SLF:17 colon polyps removed/moderate sized hemorrhoids   COLONOSCOPY WITH PROPOFOL N/A 06/19/2018   Procedure: COLONOSCOPY WITH PROPOFOL;  Surgeon: Danie Binder, MD;  Location: [facility];  Service: Endoscopy;  Laterality: N/A;  7:30am   ESOPHAGOGASTRODUODENOSCOPY N/A 01/05/2015   ID:[id number] at gastro junction/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/small HH/mild erosive gastrtis and duodenitis   ESOPHAGOGASTRODUODENOSCOPY (EGD) WITH PROPOFOL N/A 06/19/2018   Procedure: ESOPHAGOGASTRODUODENOSCOPY (EGD) WITH PROPOFOL;  Surgeon: Danie Binder, MD;  Location[facility];  Service: Endoscopy;  Laterality: N/A;   HEMORRHOID BANDING N/A 01/05/2015   Procedure: HEMORRHOID BANDING;  Surgeon: Danie Binder, MD;  [facility];  Service: Endoscopy;  Laterality: N/A;   LEFT AND RIGHT HEART CATHETERIZATION WITH CORONARY ANGIOGRAM  N/A 12/03/2014   Procedure: LEFT AND RIGHT HEART CATHETERIZATION WITH CORONARY ANGIOGRAM;  Surgeon: Blane Ohara, MD;  Location: [facility] CATH [facility];  Service: Cardiovascular;  Laterality: N/A;   POLYPECTOMY  06/19/2018   Procedure: POLYPECTOMY;  Surgeon: Danie Binder, MD;  [facility];  Service:  Endoscopy;;  rectal    SAVORY DILATION  06/19/2018   Procedure: SAVORY DILATION;  Surgeon: Danie Binder, MD;  [facility]: [facility];  Service: Endoscopy;;   TONSILLECTOMY      Allergies Reglan [metoclopramide]  Family History  Problem Relation Age of Onset   Heart attack Father    Hypertension Father    Diabetes Father    Colon cancer Neg Hx    Colon polyps Neg Hx     Social History Social History   Tobacco Use   Smoking status: Former Smoker    Packs/day: 0.25    Years: 3.00    Pack years: 0.75    Types: Cigars, Cigarettes    Start date: 10/19/1969    Quit date: 10/22/1972    Years since quitting: 47.5   Smokeless tobacco: Former Systems developer    Types: Snuff  Substance Use Topics   Alcohol use: No    Alcohol/week: 0.0 standard drinks   Drug use: No    Review of Systems  Constitutional: No fever/chills. Sudden lightheadedness.  Eyes: No visual changes. ENT: No sore throat. Cardiovascular: Denies chest pain. Respiratory: Denies shortness of breath. Gastrointestinal: No abdominal pain.  No nausea, no vomiting.  No diarrhea.  No constipation. Genitourinary: Negative for dysuria. Musculoskeletal: Negative for back pain. Skin: Negative for rash. Neurological: Negative for headaches. Positive leg weakness, speech change, face asymmetry starting at 12:40 PM.   10-point ROS otherwise negative.  ____________________________________________   PHYSICAL EXAM:  VITAL SIGNS: Vitals:   04/15/20 2000 04/15/20 2056  BP: (!) 127/99 (!) 146/91  Pulse: 63 81  Resp: 18   Temp:    SpO2: 100% 100%    Constitutional: Alert and oriented. Well appearing and in no acute distress. Eyes: Conjunctivae are normal. PERRL.  Head: Atraumatic. Nose: No congestion/rhinnorhea. Mouth/Throat: Mucous membranes are moist. Neck: No stridor.  Cardiovascular: Normal rate, regular rhythm. Good peripheral circulation. Grossly normal heart sounds.   Respiratory: Normal respiratory  effort.  No retractions. Lungs CTAB. Gastrointestinal: Soft and nontender. No distention.  Musculoskeletal: No lower extremity tenderness nor edema. No gross deformities of extremities. Neurologic:  Normal speech and language (wife reports sounds different to her).  Very slight flattening of the left nasolabial fold with preserved sensation in the bilateral face.  Normal sensation in the arms or legs.  Very mild weakness in the left leg when compared to the right. Normal grip strength, biceps, and triceps bilaterally. No upper extremity drift.  Skin:  Skin is warm, dry and intact. No rash noted.   ____________________________________________   LABS (all labs ordered are listed, but only abnormal results are displayed)  Labs Reviewed  COMPREHENSIVE METABOLIC PANEL - Abnormal; Notable for the following components:      Result Value   Calcium 8.8 (*)    All other components within normal limits  URINALYSIS, ROUTINE W REFLEX MICROSCOPIC - Abnormal; Notable for the following components:   Color, Urine COLORLESS (*)    All other components within normal limits  CBG MONITORING, ED - Abnormal; Notable for the following components:   Glucose-Capillary 112 (*)    All other components within normal limits  SARS CORONAVIRUS 2 BY RT PCR (  HOSPITAL ORDER, [facility])  ETHANOL  PROTIME-INR  APTT  CBC  DIFFERENTIAL  RAPID URINE DRUG SCREEN, HOSP PERFORMED  HIV ANTIBODY (ROUTINE TESTING W REFLEX)  HEMOGLOBIN A1C  LIPID PANEL  I-STAT CHEM 8, ED   ____________________________________________  EKG   EKG Interpretation  Date/Time:  Wednesday Apr 15 2020 13:54:01 EDT Ventricular Rate:  69 PR Interval:    QRS Duration: 102 QT Interval:  365 QTC Calculation: 391 R Axis:   -27 Text Interpretation: Atrial fibrillation Borderline left axis deviation Borderline low voltage, extremity leads No STEMI Confirmed by Nanda Quinton [phone]) on 04/15/2020 9:00:48 PM        ____________________________________________  RADIOLOGY  CT CEREBRAL PERFUSION W CONTRAST  Result Date: 04/15/2020 CLINICAL DATA:  Left-sided facial droop, dizziness EXAM: CT ANGIOGRAPHY HEAD AND NECK CT PERFUSION BRAIN TECHNIQUE: Multidetector CT imaging of the head and neck was performed using the standard protocol during bolus administration of intravenous contrast. Multiplanar CT image reconstructions and MIPs were obtained to evaluate the vascular anatomy. Carotid stenosis measurements (when applicable) are obtained utilizing NASCET criteria, using the distal internal carotid diameter as the denominator. Multiphase CT imaging of the brain was performed following IV bolus contrast injection. Subsequent parametric perfusion maps were calculated using RAPID software. CONTRAST:  13mL OMNIPAQUE IOHEXOL 350 MG/ML SOLN COMPARISON:  None. FINDINGS: CT HEAD FINDINGS Brain: No acute intracranial hemorrhage, mass effect, or edema. No new loss of gray-white differentiation since the recent prior study. Vascular: No new findings. Skull: No new findings. Sinuses/Orbits: No new findings. Other: None. ASPECTS ([place] Stroke Program Early CT Score) - Ganglionic level infarction (caudate, lentiform nuclei, internal capsule, insula, M1-M3 cortex): 7 - Supraganglionic infarction (M4-M6 cortex): 3 Total score (0-10 with 10 being normal): 10 Review of the MIP images confirms the above findings CTA NECK FINDINGS Aortic arch: Great vessel origins are patent. Right carotid system: Patent. There is no measurable stenosis at the ICA origin. Left carotid system: Patent. There is no measurable stenosis at the ICA origin. Vertebral arteries: Patent and nearly codominant. Plaque at the left vertebral origin causes mild to moderate stenosis. Skeleton: Cervical spine degenerative changes without high-grade osseous encroachment on the spinal canal. Other neck: No mass or adenopathy. Upper chest: No apical lung mass. Review of the  MIP images confirms the above findings CTA HEAD FINDINGS Anterior circulation: Intracranial internal carotids are patent with calcified plaque causing mild stenosis. Anterior and middle cerebral arteries are patent. Posterior circulation: Intracranial vertebral arteries, basilar artery, and posterior cerebral arteries are patent. There are patent bilateral posterior communicating arteries. Venous sinuses: As permitted by contrast timing, patent. Review of the MIP images confirms the above findings CT Brain Perfusion Findings: CBF (<30%) Volume: 108mL Perfusion (Tmax>6.0s) volume: 45mL Mismatch Volume: 75mL Infarction Location:None IMPRESSION: No acute intracranial hemorrhage or evidence of acute infarction. ASPECT score is 10. There is no large vessel occlusion or hemodynamically significant stenosis. Perfusion imaging demonstrates no evidence of core infarction or penumbra. Electronically Signed   By: Macy Mis M.D.   On: 04/15/2020 15:56   CT HEAD CODE STROKE WO CONTRAST  Result Date: 04/15/2020 CLINICAL DATA:  Code stroke. Focal neuro deficit greater than 6 hours. Left leg weakness and slurred speech EXAM: CT HEAD WITHOUT CONTRAST TECHNIQUE: Contiguous axial images were obtained from the base of the skull through the vertex without intravenous contrast. COMPARISON:  CT head 05/14/2017 FINDINGS: Brain: No evidence of acute infarction, hemorrhage, hydrocephalus, extra-axial collection or mass lesion/mass effect. Vascular: Negative  for hyperdense vessel Skull: Negative Sinuses/Orbits: Mucosal edema paranasal sinuses. Hyperdense secretions in the sphenoid sinus may represent fungal infection. Bilateral cataract extraction. Other: None ASPECTS ([place] Stroke Program Early CT Score) - Ganglionic level infarction (caudate, lentiform nuclei, internal capsule, insula, M1-M3 cortex): 7 - Supraganglionic infarction (M4-M6 cortex): 3 Total score (0-10 with 10 being normal): 10 IMPRESSION: 1. No acute intracranial  abnormality 2. ASPECTS is 10 3. These results were called by telephone at the time of interpretation on 04/15/2020 at 2:09 pm to provider Tzirel Leonor , who verbally acknowledged these results. Electronically Signed   By: Franchot Gallo M.D.   On: 04/15/2020 14:10   CT ANGIO HEAD CODE STROKE  Result Date: 04/15/2020 CLINICAL DATA:  Left-sided facial droop, dizziness EXAM: CT ANGIOGRAPHY HEAD AND NECK CT PERFUSION BRAIN TECHNIQUE: Multidetector CT imaging of the head and neck was performed using the standard protocol during bolus administration of intravenous contrast. Multiplanar CT image reconstructions and MIPs were obtained to evaluate the vascular anatomy. Carotid stenosis measurements (when applicable) are obtained utilizing NASCET criteria, using the distal internal carotid diameter as the denominator. Multiphase CT imaging of the brain was performed following IV bolus contrast injection. Subsequent parametric perfusion maps were calculated using RAPID software. CONTRAST:  156mL OMNIPAQUE IOHEXOL 350 MG/ML SOLN COMPARISON:  None. FINDINGS: CT HEAD FINDINGS Brain: No acute intracranial hemorrhage, mass effect, or edema. No new loss of gray-white differentiation since the recent prior study. Vascular: No new findings. Skull: No new findings. Sinuses/Orbits: No new findings. Other: None. ASPECTS ([place] Stroke Program Early CT Score) - Ganglionic level infarction (caudate, lentiform nuclei, internal capsule, insula, M1-M3 cortex): 7 - Supraganglionic infarction (M4-M6 cortex): 3 Total score (0-10 with 10 being normal): 10 Review of the MIP images confirms the above findings CTA NECK FINDINGS Aortic arch: Great vessel origins are patent. Right carotid system: Patent. There is no measurable stenosis at the ICA origin. Left carotid system: Patent. There is no measurable stenosis at the ICA origin. Vertebral arteries: Patent and nearly codominant. Plaque at the left vertebral origin causes mild to moderate  stenosis. Skeleton: Cervical spine degenerative changes without high-grade osseous encroachment on the spinal canal. Other neck: No mass or adenopathy. Upper chest: No apical lung mass. Review of the MIP images confirms the above findings CTA HEAD FINDINGS Anterior circulation: Intracranial internal carotids are patent with calcified plaque causing mild stenosis. Anterior and middle cerebral arteries are patent. Posterior circulation: Intracranial vertebral arteries, basilar artery, and posterior cerebral arteries are patent. There are patent bilateral posterior communicating arteries. Venous sinuses: As permitted by contrast timing, patent. Review of the MIP images confirms the above findings CT Brain Perfusion Findings: CBF (<30%) Volume: 13mL Perfusion (Tmax>6.0s) volume: 23mL Mismatch Volume: 61mL Infarction Location:None IMPRESSION: No acute intracranial hemorrhage or evidence of acute infarction. ASPECT score is 10. There is no large vessel occlusion or hemodynamically significant stenosis. Perfusion imaging demonstrates no evidence of core infarction or penumbra. Electronically Signed   By: Macy Mis M.D.   On: 04/15/2020 15:56   CT ANGIO NECK CODE STROKE  Result Date: 04/15/2020 CLINICAL DATA:  Left-sided facial droop, dizziness EXAM: CT ANGIOGRAPHY HEAD AND NECK CT PERFUSION BRAIN TECHNIQUE: Multidetector CT imaging of the head and neck was performed using the standard protocol during bolus administration of intravenous contrast. Multiplanar CT image reconstructions and MIPs were obtained to evaluate the vascular anatomy. Carotid stenosis measurements (when applicable) are obtained utilizing NASCET criteria, using the distal internal carotid diameter as the denominator. Multiphase CT  imaging of the brain was performed following IV bolus contrast injection. Subsequent parametric perfusion maps were calculated using RAPID software. CONTRAST:  153mL OMNIPAQUE IOHEXOL 350 MG/ML SOLN COMPARISON:  None.  FINDINGS: CT HEAD FINDINGS Brain: No acute intracranial hemorrhage, mass effect, or edema. No new loss of gray-white differentiation since the recent prior study. Vascular: No new findings. Skull: No new findings. Sinuses/Orbits: No new findings. Other: None. ASPECTS ([place] Stroke Program Early CT Score) - Ganglionic level infarction (caudate, lentiform nuclei, internal capsule, insula, M1-M3 cortex): 7 - Supraganglionic infarction (M4-M6 cortex): 3 Total score (0-10 with 10 being normal): 10 Review of the MIP images confirms the above findings CTA NECK FINDINGS Aortic arch: Great vessel origins are patent. Right carotid system: Patent. There is no measurable stenosis at the ICA origin. Left carotid system: Patent. There is no measurable stenosis at the ICA origin. Vertebral arteries: Patent and nearly codominant. Plaque at the left vertebral origin causes mild to moderate stenosis. Skeleton: Cervical spine degenerative changes without high-grade osseous encroachment on the spinal canal. Other neck: No mass or adenopathy. Upper chest: No apical lung mass. Review of the MIP images confirms the above findings CTA HEAD FINDINGS Anterior circulation: Intracranial internal carotids are patent with calcified plaque causing mild stenosis. Anterior and middle cerebral arteries are patent. Posterior circulation: Intracranial vertebral arteries, basilar artery, and posterior cerebral arteries are patent. There are patent bilateral posterior communicating arteries. Venous sinuses: As permitted by contrast timing, patent. Review of the MIP images confirms the above findings CT Brain Perfusion Findings: CBF (<30%) Volume: 87mL Perfusion (Tmax>6.0s) volume: 61mL Mismatch Volume: 53mL Infarction Location:None IMPRESSION: No acute intracranial hemorrhage or evidence of acute infarction. ASPECT score is 10. There is no large vessel occlusion or hemodynamically significant stenosis. Perfusion imaging demonstrates no evidence of core  infarction or penumbra. Electronically Signed   By: Macy Mis M.D.   On: 04/15/2020 15:56    ____________________________________________   PROCEDURES  Procedure(s) performed:   Procedures  CRITICAL CARE Performed by: Margette Fast Total critical care time: 35 minutes Critical care time was exclusive of separately billable procedures and treating other patients. Critical care was necessary to treat or prevent imminent or life-threatening deterioration. Critical care was time spent personally by me on the following activities: development of treatment plan with patient and/or surrogate as well as nursing, discussions with consultants, evaluation of patient's response to treatment, examination of patient, obtaining history from patient or surrogate, ordering and performing treatments and interventions, ordering and review of laboratory studies, ordering and review of radiographic studies, pulse oximetry and re-evaluation of patient's condition.  Nanda Quinton, MD Emergency Medicine  ____________________________________________   INITIAL IMPRESSION / ASSESSMENT AND PLAN / ED COURSE  Pertinent labs & imaging results that were available during my care of the patient were reviewed by me and considered in my medical decision making (see chart for details).   Patient arrives by private vehicle with strokelike symptoms which began at 12:40 PM.  He continues to have very slight flattening of the nasolabial fold and subjective voice changes remaining although sounds like they are much improved from prior.  Considering stroke on my differential. Near syncope also a consideration. No CP or heart palpitations.  I activated a code stroke with continued deficits and patient being within the tPA window. No LVO concerns as patient is VAN negative.   Spoke with Radiology regarding CT and Neuro after their evaluation. Patient declined tPA.   Discussed patient's case with [facility] to request admission.  Patient and family (if present) updated with plan. Care transferred to [facility] service.  I reviewed all nursing notes, vitals, pertinent old records, EKGs, labs, imaging (as available).  ____________________________________________  FINAL CLINICAL IMPRESSION(S) / ED DIAGNOSES  Final diagnoses:  Stroke-like symptoms  Acute ischemic stroke (HCC)  Acute ischemic stroke ([place])     MEDICATIONS GIVEN DURING THIS VISIT:  Medications  aspirin EC tablet 325 mg (325 mg Oral Given 04/15/20 2059)  furosemide (LASIX) tablet 20 mg (20 mg Oral Given 04/15/20 2100)  pantoprazole (PROTONIX) EC tablet 40 mg (40 mg Oral Given 04/15/20 2100)  calcium carbonate (TUMS - dosed in mg elemental calcium) chewable tablet 400-600 mg of elemental calcium (has no administration in time range)  Vitamin D CAPS 15,000 Units (15,000 Units Oral Not Given 04/15/20 2022)  topiramate (TOPAMAX) tablet 50 mg (has no administration in time range)   stroke: mapping our early stages of recovery book (0 each Does not apply Hold 04/15/20 2022)  0.9 %  sodium chloride infusion ( Intravenous Not Given 04/15/20 2023)  acetaminophen (TYLENOL) tablet 650 mg (has no administration in time range)    Or  acetaminophen (TYLENOL) 160 MG/5ML solution 650 mg (has no administration in time range)    Or  acetaminophen (TYLENOL) suppository 650 mg (has no administration in time range)  senna-docusate (Senokot-S) tablet 1 tablet (has no administration in time range)  enoxaparin (LOVENOX) injection 40 mg (has no administration in time range)  hydrALAZINE (APRESOLINE) injection 10 mg (has no administration in time range)  iohexol (OMNIPAQUE) 350 MG/ML injection 100 mL (100 mLs Intravenous Contrast Given 04/15/20 1511)     Note:  This document was prepared using Dragon voice recognition software and may include unintentional dictation errors.  Nanda Quinton, MD, [facility] Emergency Medicine    Jalaysia Lobb, [facility], MD 04/15/20 2101

## 2020-04-15 NOTE — ED Triage Notes (Signed)
Pt was at his friends house, stated he was standing up and got dizzy and almost fell. States " my friend said my lips were drawing up and I was talking funny"  A/o x 4. [id number]

## 2020-04-15 NOTE — H&P (Signed)
History and Physical  David Dougherty [id number] DOB: Apr 12, 1951 DOA: 04/15/2020   PCP: Kathyrn Drown, MD   Patient coming from: Home  Chief Complaint: dysarthria, left hemiparesis  HPI:  [name] is a 69 y.o. male with medical history of chronic atrial fibrillation, coronary disease, GERD, nonischemic cardiomyopathy with EF 40-45%, hypertension, hyperlipidemia presenting with left hemiparesis, word finding difficulty, and dysarthria that began around 12:40 PM on 04/15/2020.  Last known normal was around 11:30 AM on 04/15/2020.  The patient was speaking with some friends when he began feeling lightheaded.  His friends noted that he had some left facial droop.  They also noted the patient had some word finding difficulty as well as slurred speech.  The patient felt dizzy and went to sit down.  When he tried to get up, he felt weak in his legs and was leaning toward the left.  He subsequently stated that he felt left leg weakness and left arm weakness.  He arrived to the emergency department by private vehicle.  In the emergency department, his deficits were improving, but were not resolved.  Apparently, the patient was seen by teleneurology.  He was offered TPA, but he refused.  He denies any fevers, chills, chest pain, shortness breath, coughing, hemoptysis, nausea, vomiting, diarrhea, abdominal pain.  He denies any headache.  He did have some blurry vision but this is stable.  In the emergency department, the patient was afebrile hemodynamically stable with oxygen saturation 100% on room air.  BMP was unremarkable.  EKG showed atrial fibrillation with nonspecific T wave changes.  WBC 5.0, hemoglobin 14.9, platelets 202,000.  CT of the brain was negative.  Assessment/Plan: Dysphasia/left hemiparesis -Concerning for stroke -Neurology Consult -PT/OT evaluation -Speech therapy eval -CT brain-- -MRI brain-- -MRA brain-- -Carotid  Duplex-- -Echo-- -LDL-- -HbA1C-- -Antiplatelet--ASA 325 mg  Chronic atrial fibrillation -Presently rate controlled -The patient refused to take NOAC--stopped over 2 years ago -I discussed the risk, benefits, and alternatives.  He states that he wanted to think about it -Continue carvedilol -CHADS-VASc = 4 (CHF, HTN, Age, ASVD)  Nonischemic cardiomyopathy -08/18/2010 echo EF 40 to 45%, diffuse HK -Clinically euvolemic -Holding carvedilol to allow for permissive hypertension  -Continue home dose furosemide -Holding losartan to allow for permissive hypertension  Essential hypertension -Holding losartan and carvedilol to allow for permissive hypertension  Chronic daily headache -continue Topamax         Past Medical History:  Diagnosis Date  . Atrial fibrillation ([place])   . CAD (coronary artery disease)    70% distal circumflex 11/2014   . Dysrhythmia    AFib  . GERD (gastroesophageal reflux disease)   . Nonischemic cardiomyopathy ([place])   . Tobacco abuse    Snuff   Past Surgical History:  Procedure Laterality Date  . APPENDECTOMY    . CATARACT EXTRACTION W/PHACO Left 11/18/2019   Procedure: CATARACT EXTRACTION PHACO AND INTRAOCULAR LENS PLACEMENT (IOC);  Surgeon: [name], MD;  Location: [facility];  Service: Ophthalmology;  Laterality: Left;  CDE: 9.66  . CATARACT EXTRACTION W/PHACO Right 12/04/2019   Procedure: CATARACT EXTRACTION PHACO AND INTRAOCULAR LENS PLACEMENT RIGHT EYE (CDE: 4.56);  Surgeon: [name], MD;  Location: [facility];  Service: Ophthalmology;  Laterality: Right;  . COLONOSCOPY  2005   Neg  . COLONOSCOPY N/A 01/05/2015   SLF:17 colon polyps removed/moderate sized hemorrhoids  . COLONOSCOPY WITH PROPOFOL N/A 06/19/2018   Procedure: COLONOSCOPY WITH PROPOFOL;  Surgeon: Danie Binder, MD;  Location: [facility];  Service: Endoscopy;  Laterality: N/A;  7:30am  . 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 ESOPHAGOGASTRODUODENOSCOPY (EGD) WITH PROPOFOL N/A 06/19/2018   Procedure: ESOPHAGOGASTRODUODENOSCOPY (EGD) WITH PROPOFOL;  Surgeon: Danie Binder, MD;  Location: [facility];  Service: Endoscopy;  Laterality: N/A;  . HEMORRHOID BANDING N/A 01/05/2015   Procedure: HEMORRHOID BANDING;  Surgeon: Danie Binder, MD;  Location: [facility];  Service: Endoscopy;  Laterality: N/A;  . LEFT AND RIGHT HEART CATHETERIZATION WITH CORONARY ANGIOGRAM N/A 12/03/2014   Procedure: LEFT AND RIGHT HEART CATHETERIZATION WITH CORONARY ANGIOGRAM;  Surgeon: Blane Ohara, MD;  Location: [facility] CATH LAB;  Service: Cardiovascular;  Laterality: N/A;  . POLYPECTOMY  06/19/2018   Procedure: POLYPECTOMY;  Surgeon: Danie Binder, MD;  Location: [facility];  Service: Endoscopy;;  rectal   . SAVORY DILATION  06/19/2018   Procedure: SAVORY DILATION;  Surgeon: Danie Binder, MD;  Location: [facility];  Service: Endoscopy;;  . TONSILLECTOMY     Social History:  reports that he quit smoking about 47 years ago. His smoking use included cigars and cigarettes. He started smoking about 50 years ago. He has a 0.75 pack-year smoking history. He has quit using smokeless tobacco.  His smokeless tobacco use included snuff. He reports that he does not drink alcohol or use drugs.   Family History  Problem Relation Age of Onset  . Heart attack Father   . Hypertension Father   . Diabetes Father   . Colon cancer Neg Hx   . Colon polyps Neg Hx      Allergies  Allergen Reactions  . Reglan [Metoclopramide] Anaphylaxis     Prior to Admission medications   Medication Sig Start Date End Date Taking? Authorizing Provider  acetaminophen (TYLENOL) 500 MG tablet Take 1,000 mg by mouth every 6 (six) hours as needed for mild pain or headache.    Yes [provider]  aspirin EC 81 MG tablet Take 81 mg by mouth daily.   Yes [provider]   calcium carbonate (TUMS - DOSED IN MG ELEMENTAL CALCIUM) 500 MG chewable tablet Chew 2-3 tablets by mouth as needed for indigestion or heartburn.    Yes [provider]  carvedilol (COREG) 3.125 MG tablet Take 1 tablet (3.125 mg total) by mouth 2 (two) times daily. 11/21/19  Yes Luking, Elayne Snare, MD  Cholecalciferol (VITAMIN D) 125 MCG (5000 UT) CAPS Take 15,000 Units by mouth daily.   Yes [provider]  furosemide (LASIX) 20 MG tablet Take 1 tablet (20 mg total) by mouth daily. 11/21/19  Yes Kathyrn Drown, MD  losartan (COZAAR) 25 MG tablet Take 1 tablet (25 mg total) by mouth daily. 11/21/19  Yes Luking, Elayne Snare, MD  nitroGLYCERIN (NITROSTAT) 0.4 MG SL tablet PLACE 1 TAB UNDER TONGUE EVERY 5 MIN IF NEEDED FOR CHEST PAIN. MAY USE 3 TIMES.NO RELIEF CALL 911. Patient taking differently: Place 0.4 mg under the tongue every 5 (five) minutes as needed for chest pain.  02/14/17  Yes Satira Sark, MD  omeprazole (PRILOSEC) 20 MG capsule TAKE 1 CAPSULE BY MOUTH ONCE DAILY 30  MINUTES  PRIOR  TO  BREAKFAST 11/21/19  Yes Luking, Elayne Snare, MD  oxymetazoline (AFRIN) 0.05 % nasal spray Place 1 spray into both nostrils 2 (two) times daily as needed for congestion.   Yes [provider]  Phenyleph-Doxylamine-DM-APAP Chauncy Passy  SEVERE COLD/FLU) 5-6.25-10-325 MG/15ML LIQD Take 15 mLs by mouth at bedtime as needed (congestion).   Yes [provider]  Phenylephrine-DM-GG-APAP (VICKS DAYQUIL SEVERE COLD/FLU) 5-10-200-325 MG/15ML LIQD Take 15 mLs by mouth 2 (two) times daily as needed (congestion).   Yes [provider]  topiramate (TOPAMAX) 50 MG tablet Take 1 tablet (50 mg total) by mouth 2 (two) times daily. 11/21/19  Yes Kathyrn Drown, MD    Review of Systems:  Constitutional:  No weight loss, night sweats, Fevers, chills, fatigue.  Head&Eyes: No headache.  No vision loss.  No eye pain or scotoma ENT:  No Difficulty swallowing,Tooth/dental problems,Sore  throat,  No ear ache, post nasal drip,  Cardio-vascular:  No chest pain, Orthopnea, PND, swelling in lower extremities,  dizziness, palpitations  GI:  No  abdominal pain, nausea, vomiting, diarrhea, loss of appetite, hematochezia, melena, heartburn, indigestion, Resp:  No shortness of breath with exertion or at rest. No cough. No coughing up of blood .No wheezing.No chest wall deformity  Skin:  no rash or lesions.  GU:  no dysuria, change in color of urine, no urgency or frequency. No flank pain.  Musculoskeletal:  No joint pain or swelling. No decreased range of motion. No back pain.  Psych:  No change in mood or affect. No depression or anxiety. Neurologic: No headache, no dysesthesia, no vision loss. No syncope  Physical Exam: Vitals:   04/15/20 1500 04/15/20 1505 04/15/20 1510 04/15/20 1515  BP: (!) 189/98 (!) 159/103 (!) 153/85 (!) 133/99  Pulse: 69 65 (!) 54 (!) 46  Resp: (!) 25 20 18 17   SpO2: 100% 98% 98% 99%  Weight:      Height:       General:  A&O x 3, NAD, nontoxic, pleasant/cooperative Head/Eye: No conjunctival hemorrhage, no icterus, [place]/AT, No nystagmus ENT:  No icterus,  No thrush, good dentition, no pharyngeal exudate Neck:  No masses, no lymphadenpathy, no bruits CV:  RRR, no rub, no gallop, no S3 Lung:  CTAB, good air movement, no wheeze, no rhonchi Abdomen: soft/NT, +BS, nondistended, no peritoneal signs Ext: No cyanosis, No rashes, No petechiae, No lymphangitis, No edema Neuro: CNII-XII intact, strength 4/5 in bilateral upper and lower extremities, no dysmetria  Labs on Admission:  Basic Metabolic Panel: Recent Labs  Lab 04/15/20 1351 04/15/20 1358  NA 139 142  K 3.7 3.8  CL 108 109  CO2 23  --   GLUCOSE 95 89  BUN 16 15  CREATININE 1.06 1.00  CALCIUM 8.8*  --    Liver Function Tests: Recent Labs  Lab 04/15/20 1351  AST 20  ALT 20  ALKPHOS 72  BILITOT 0.7  PROT 7.5  ALBUMIN 4.3   No results for input(s): LIPASE, AMYLASE in the last  168 hours. No results for input(s): AMMONIA in the last 168 hours. CBC: Recent Labs  Lab 04/15/20 1351 04/15/20 1358  WBC 5.0  --   NEUTROABS 2.8  --   HGB 14.9 16.0  HCT 46.3 47.0  MCV 91.9  --   PLT 202  --    Coagulation Profile: Recent Labs  Lab 04/15/20 1351  INR 1.0   Cardiac Enzymes: No results for input(s): CKTOTAL, CKMB, CKMBINDEX, TROPONINI in the last 168 hours. BNP: Invalid input(s): POCBNP CBG: Recent Labs  Lab 04/15/20 1405  GLUCAP 112*   Urine analysis: No results found for: COLORURINE, APPEARANCEUR, LABSPEC, PHURINE, GLUCOSEU, HGBUR, BILIRUBINUR, KETONESUR, PROTEINUR, UROBILINOGEN, NITRITE, LEUKOCYTESUR Sepsis Labs: @LABRCNTIP (procalcitonin:4,lacticidven:4) )No results found for  this or any previous visit (from the past 240 hour(s)).   Radiological Exams on Admission: CT HEAD CODE STROKE WO CONTRAST  Result Date: 04/15/2020 CLINICAL DATA:  Code stroke. Focal neuro deficit greater than 6 hours. Left leg weakness and slurred speech EXAM: CT HEAD WITHOUT CONTRAST TECHNIQUE: Contiguous axial images were obtained from the base of the skull through the vertex without intravenous contrast. COMPARISON:  CT head 05/14/2017 FINDINGS: Brain: No evidence of acute infarction, hemorrhage, hydrocephalus, extra-axial collection or mass lesion/mass effect. Vascular: Negative for hyperdense vessel Skull: Negative Sinuses/Orbits: Mucosal edema paranasal sinuses. Hyperdense secretions in the sphenoid sinus may represent fungal infection. Bilateral cataract extraction. Other: None ASPECTS (Trainer Stroke Program Early CT Score) - Ganglionic level infarction (caudate, lentiform nuclei, internal capsule, insula, M1-M3 cortex): 7 - Supraganglionic infarction (M4-M6 cortex): 3 Total score (0-10 with 10 being normal): 10 IMPRESSION: 1. No acute intracranial abnormality 2. ASPECTS is 10 3. These results were called by telephone at the time of interpretation on 04/15/2020 at 2:09 pm to  provider JOSHUA LONG , who verbally acknowledged these results. Electronically Signed   By: Franchot Gallo M.D.   On: 04/15/2020 14:10    EKG: Independently reviewed. Atrial fib, nonspecific STT change    Time spent:60 minutes Code Status:   FULL Family Communication:  No Family at bedside Disposition Plan: expect 1-2 day hospitalization Consults called: neurology  DVT Prophylaxis: [place] Lovenox  Orson Eva, DO  [facility] Pager [phone]  If 7PM-7AM, please contact night-coverage [email or web address] Password [facility] 04/15/2020, 3:19 PM

## 2020-04-15 NOTE — ED Notes (Signed)
Pt refusing TPA. Neuro MD discussing benefits and risks. Pt declines TPA

## 2020-04-15 NOTE — ED Notes (Signed)
Pt in MRI at this time 

## 2020-04-15 NOTE — ED Notes (Signed)
Pt transported to CT ?

## 2020-04-15 NOTE — Progress Notes (Signed)
CT Code Stroke times [id number] call time [id number]   Beeper time [id number] exam started 1559 exam finished [id number] images sent to [id number] exam completed in [place] [facility] called

## 2020-04-15 NOTE — ED Notes (Signed)
Pt remains in CT. Unable to do NIH at this time.

## 2020-04-15 NOTE — Care Management Obs Status (Signed)
[place] NOTIFICATION   Patient Details  Name: David Dougherty MRN: [id number] Date of Birth: 1950-12-24   Medicare Observation Status Notification Given:  Yes    [place], LCSW 04/15/2020, 4:26 PM

## 2020-04-15 NOTE — ED Notes (Signed)
Per friend David Dougherty, [place] 1130

## 2020-04-15 NOTE — ED Notes (Signed)
ED Provider at bedside. 

## 2020-04-15 NOTE — ED Notes (Signed)
Pt on phone with friend trying to figure out last known well.

## 2020-04-15 NOTE — ED Notes (Signed)
Pt back from CT

## 2020-04-15 NOTE — Consult Note (Addendum)
[facility] Tele[facility]urology Consult Services   Date of Service:   04/15/2020 13:55:42  Impression:     .  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 CT head unremarkable. Symptoms are improving but not resolved. Diff Dx: acute CVA. Alteplase offered, discussed risks, benefits with pt, wife in presence of RN and stroke RN. Pt does not feel comfortable with alteplase treatment, he declined. It was discussed this can help with cva symptoms, he does not feel comfortable with alteplase. CTA brain, neck and CT perfusion ordered to rule out LVO. He will need admission, ASA, MRI brain, CVA orders, echo. D/w ER all of the above.  Metrics: Last Known Well: 04/15/2020 11:30:00 [facility] Notification Time: 04/15/2020 13:55:42 Arrival Time: 04/15/2020 13:41:00 Stamp Time: 04/15/2020 13:55:42 Time First Login Attempt: 04/15/2020 14:03:48 Symptoms: dizziness, dysarthria NIHSS Start Assessment Time: 04/15/2020 14:07:51 Patient is not a candidate for Alteplase/Activase. Alteplase Medical Decision: 04/15/2020 14:07:35 Patient was not deemed candidate for Alteplase/Activase thrombolytics because of following reasons: Pt declined. .  CT head showed no acute hemorrhage or acute core infarct.  Lower Likelihood of Large Vessel Occlusion but Following Stat Studies are Recommended  Radiologist was not called back for review of advanced imaging because reviewed ED Physician notified of diagnostic impression and management plan on 04/15/2020 14:29:15  Our recommendations are outlined below.  Recommendations:     .  Activate Stroke Protocol Admission/Order Set     .  Stroke/Telemetry Floor     .  Neuro Checks     .  Bedside Swallow Eval     .  DVT Prophylaxis     .  IV Fluids, Normal Saline     .   Head of Bed 30 Degrees     .  Euglycemia and Avoid Hyperthermia (PRN Acetaminophen)     .  Antiplatelet Therapy Recommended  Routine Consultation with [facility] for Follow up Care  Sign Out:     .  Discussed with Emergency Department Provider    ------------------------------------------------------------------------------  History of Present Illness: Patient is a 69 year old Male.  Patient was brought by private transportation with symptoms of dizziness, dysarthria  69 yr old man, with hx of atrial fib, HTN, CAD, was at friends house, last known normal 11: 30 am, and then had dizziness, dyasrthria, ? L facial droop, and ataxia, symptoms improved in ER. He has no hx of CVA, no focal weakness now. He denied anticoagulation, he has severe reservations about anticoagulation meds.   Past Medical History:     . Hypertension     . Coronary Artery Disease   Antiplatelet use: asa    Examination: BP(159/95), Pulse(88), Blood Glucose(112) 1A: Level of Consciousness - Alert; keenly responsive + 0 1B: Ask Month and Age - Both Questions Right + 0 1C: Blink Eyes & Squeeze Hands - Performs Both Tasks + 0 2: Test Horizontal Extraocular Movements - Normal + 0 3: Test Visual Fields - No Visual Loss + 0 4: Test Facial Palsy (Use Grimace if Obtunded) - Normal symmetry + 0 5A: Test Left Arm Motor Drift - No Drift for 10 Seconds + 0 5B: Test Right Arm Motor Drift - No Drift for 10 Seconds + 0 6A: Test Left Leg Motor Drift - No Drift for 5 Seconds + 0 6B: Test Right Leg Motor Drift - No  Drift for 5 Seconds + 0 7: Test Limb Ataxia (FNF/Heel-Shin) - No Ataxia + 0 8: Test Sensation - Mild-Moderate Loss: Less Sharp/More Dull + 1 9: Test Language/Aphasia - Normal; No aphasia + 0 10: Test Dysarthria - Mild-Moderate Dysarthria: Slurring but can be understood + 1 11: Test Extinction/Inattention - No abnormality + 0  NIHSS Score: 2  Pre-Morbid Modified Ranking Scale: 0 Points = No symptoms  at all   Patient/Family was informed the Neurology Consult would occur via TeleHealth consult by way of interactive audio and video telecommunications and consented to receiving care in this manner.   Patient is being evaluated for possible acute neurologic impairment and high probability of imminent or life-threatening deterioration. I spent total of 27 minutes providing care to this patient, including time for face to face visit via telemedicine, review of medical records, imaging studies and discussion of findings with providers, the patient and/or family.   Dr Lloyd Huger   [facility] [phone]  Case [id number]   Add CTA brain , neck no LVO, or sig stenosis CT perfusion normal.  recs as above, and d/w ER.

## 2020-04-16 ENCOUNTER — Observation Stay (HOSPITAL_COMMUNITY): Payer: Medicare Other

## 2020-04-16 ENCOUNTER — Inpatient Hospital Stay (HOSPITAL_COMMUNITY): Payer: Medicare Other

## 2020-04-16 DIAGNOSIS — E785 Hyperlipidemia, unspecified: Secondary | ICD-10-CM | POA: Diagnosis present

## 2020-04-16 DIAGNOSIS — I639 Cerebral infarction, unspecified: Secondary | ICD-10-CM | POA: Diagnosis present

## 2020-04-16 DIAGNOSIS — I4821 Permanent atrial fibrillation: Secondary | ICD-10-CM | POA: Diagnosis present

## 2020-04-16 DIAGNOSIS — R4702 Dysphasia: Secondary | ICD-10-CM | POA: Diagnosis present

## 2020-04-16 DIAGNOSIS — I482 Chronic atrial fibrillation, unspecified: Secondary | ICD-10-CM

## 2020-04-16 DIAGNOSIS — Z833 Family history of diabetes mellitus: Secondary | ICD-10-CM | POA: Diagnosis not present

## 2020-04-16 DIAGNOSIS — Z79899 Other long term (current) drug therapy: Secondary | ICD-10-CM | POA: Diagnosis not present

## 2020-04-16 DIAGNOSIS — I110 Hypertensive heart disease with heart failure: Secondary | ICD-10-CM | POA: Diagnosis present

## 2020-04-16 DIAGNOSIS — Z87891 Personal history of nicotine dependence: Secondary | ICD-10-CM | POA: Diagnosis not present

## 2020-04-16 DIAGNOSIS — R29810 Facial weakness: Secondary | ICD-10-CM | POA: Diagnosis present

## 2020-04-16 DIAGNOSIS — Z7982 Long term (current) use of aspirin: Secondary | ICD-10-CM | POA: Diagnosis not present

## 2020-04-16 DIAGNOSIS — Z20822 Contact with and (suspected) exposure to covid-19: Secondary | ICD-10-CM | POA: Diagnosis present

## 2020-04-16 DIAGNOSIS — R519 Headache, unspecified: Secondary | ICD-10-CM | POA: Diagnosis present

## 2020-04-16 DIAGNOSIS — G8194 Hemiplegia, unspecified affecting left nondominant side: Secondary | ICD-10-CM

## 2020-04-16 DIAGNOSIS — I2510 Atherosclerotic heart disease of native coronary artery without angina pectoris: Secondary | ICD-10-CM | POA: Diagnosis present

## 2020-04-16 DIAGNOSIS — I63233 Cerebral infarction due to unspecified occlusion or stenosis of bilateral carotid arteries: Secondary | ICD-10-CM | POA: Diagnosis not present

## 2020-04-16 DIAGNOSIS — I6389 Other cerebral infarction: Secondary | ICD-10-CM | POA: Diagnosis not present

## 2020-04-16 DIAGNOSIS — I6381 Other cerebral infarction due to occlusion or stenosis of small artery: Secondary | ICD-10-CM | POA: Diagnosis present

## 2020-04-16 DIAGNOSIS — I5022 Chronic systolic (congestive) heart failure: Secondary | ICD-10-CM

## 2020-04-16 DIAGNOSIS — R260 Ataxic gait: Secondary | ICD-10-CM | POA: Diagnosis not present

## 2020-04-16 DIAGNOSIS — I6502 Occlusion and stenosis of left vertebral artery: Secondary | ICD-10-CM | POA: Diagnosis present

## 2020-04-16 DIAGNOSIS — R29702 NIHSS score 2: Secondary | ICD-10-CM | POA: Diagnosis present

## 2020-04-16 DIAGNOSIS — R471 Dysarthria and anarthria: Secondary | ICD-10-CM | POA: Diagnosis present

## 2020-04-16 DIAGNOSIS — Z888 Allergy status to other drugs, medicaments and biological substances status: Secondary | ICD-10-CM | POA: Diagnosis not present

## 2020-04-16 DIAGNOSIS — I69952 Hemiplegia and hemiparesis following unspecified cerebrovascular disease affecting left dominant side: Secondary | ICD-10-CM | POA: Diagnosis not present

## 2020-04-16 DIAGNOSIS — H538 Other visual disturbances: Secondary | ICD-10-CM | POA: Diagnosis present

## 2020-04-16 DIAGNOSIS — I6523 Occlusion and stenosis of bilateral carotid arteries: Secondary | ICD-10-CM | POA: Diagnosis present

## 2020-04-16 DIAGNOSIS — I428 Other cardiomyopathies: Secondary | ICD-10-CM | POA: Diagnosis present

## 2020-04-16 DIAGNOSIS — Z8249 Family history of ischemic heart disease and other diseases of the circulatory system: Secondary | ICD-10-CM | POA: Diagnosis not present

## 2020-04-16 DIAGNOSIS — R270 Ataxia, unspecified: Secondary | ICD-10-CM | POA: Diagnosis present

## 2020-04-16 LAB — HEMOGLOBIN A1C
Hgb A1c MFr Bld: 6 % — ABNORMAL HIGH (ref 4.8–5.6)
Mean Plasma Glucose: 125.5 mg/dL

## 2020-04-16 LAB — LIPID PANEL
Cholesterol: 163 mg/dL (ref 0–200)
HDL: 34 mg/dL — ABNORMAL LOW (ref >40)
LDL Cholesterol: 98 mg/dL (ref 0–99)
Total CHOL/HDL Ratio: 4.8 RATIO
Triglycerides: 157 mg/dL — ABNORMAL HIGH (ref <150)
VLDL: 31 mg/dL (ref 0–40)

## 2020-04-16 LAB — HIV ANTIBODY (ROUTINE TESTING W REFLEX): HIV Screen 4th Generation wRfx: NONREACTIVE

## 2020-04-16 LAB — ECHOCARDIOGRAM COMPLETE
Height: 73 in
Weight: 3682.56 oz

## 2020-04-16 NOTE — Evaluation (Signed)
Physical Therapy Evaluation Patient Details Name: David Dougherty MRN: [id number] DOB: 1951/03/17 Today's Date: 04/16/2020   History of Present Illness  David Dougherty is a 69 y.o. male with medical history of chronic atrial fibrillation, coronary disease, GERD, nonischemic cardiomyopathy with EF 40-45%, hypertension, hyperlipidemia presenting with left hemiparesis, word finding difficulty, and dysarthria that began around 12:40 PM on 04/15/2020.  Last known normal was around 11:30 AM on 04/15/2020.  The patient was speaking with some friends when he began feeling lightheaded.  His friends noted that he had some left facial droop.  They also noted the patient had some word finding difficulty as well as slurred speech.  The patient felt dizzy and went to sit down.  When he tried to get up, he felt weak in his legs and was leaning toward the left.  He subsequently stated that he felt left leg weakness and left arm weakness.  He arrived to the emergency department by private vehicle.  In the emergency department, his deficits were improving, but were not resolved.  Apparently, the patient was seen by teleneurology.  He was offered TPA, but he refused.  He denies any fevers, chills, chest pain, shortness breath, coughing, hemoptysis, nausea, vomiting, diarrhea, abdominal pain.  He denies any headache.  He did have some blurry vision but this is stable.    Clinical Impression  Patient functioning near baseline for functional mobility and gait, other than mildly limited left ankle dorsiflexion during ambulation without loss of balance, after verbal cues patient able to increase left ankle dorsiflexion WNL with good return for left heel to toe stepping.  Plan:  Patient discharged from physical therapy to care of nursing for ambulation daily as tolerated for length of stay.     Follow Up Recommendations No PT follow up;Supervision - Intermittent    Equipment Recommendations  None recommended by PT     Recommendations for Other Services       Precautions / Restrictions Precautions Precautions: None Restrictions Weight Bearing Restrictions: No      Mobility  Bed Mobility Overal bed mobility: Independent                Transfers Overall transfer level: Modified independent Equipment used: None                Ambulation/Gait Ambulation/Gait assistance: Modified independent (Device/Increase time) Gait Distance (Feet): 200 Feet Assistive device: None Gait Pattern/deviations: Decreased dorsiflexion - left Gait velocity: slightly decreased   General Gait Details: grossly WFL except slightly limited left ankle dorsiflexion without loss of balance, after given verbal cues able to heel to step on left foot with good carryover  Stairs            Wheelchair Mobility    Modified Rankin (Stroke Patients Only)       Balance Overall balance assessment: Mild deficits observed, not formally tested                                           Pertinent Vitals/Pain Pain Assessment: 0-10 Pain Score: 3  Pain Location: right hip numbness possibly due to lying in bed to long per patient Pain Descriptors / Indicators: Numbness Pain Intervention(s): Limited activity within patient's tolerance;Monitored during session;Repositioned;Other (comment)(Put lounge chair in room for patient to sit in)    [place] expects to be discharged to:: Private residence Living Arrangements: Spouse/significant  other Available Help at Discharge: Family;Available 24 hours/day Type of Home: House Home Access: Stairs to enter Entrance Stairs-Rails: None Entrance Stairs-Number of Steps: 1 Home Layout: One level Home Equipment: Walker - 2 wheels;Cane - single point;Bedside commode;Shower seat      Prior Function Level of Independence: Independent         Comments: Hydrographic surveyor, drives     Hand Dominance   Dominant Hand: Right     Extremity/Trunk Assessment   Upper Extremity Assessment Upper Extremity Assessment: Defer to OT evaluation    Lower Extremity Assessment Lower Extremity Assessment: Overall WFL for tasks assessed    Cervical / Trunk Assessment Cervical / Trunk Assessment: Normal  Communication   Communication: No difficulties  Cognition Arousal/Alertness: Awake/alert Behavior During Therapy: WFL for tasks assessed/performed Overall Cognitive Status: Within Functional Limits for tasks assessed                                        General Comments      Exercises     Assessment/Plan    PT Assessment Patent does not need any further PT services  PT Problem List         PT Treatment Interventions      PT Goals (Current goals can be found in the Care Plan section)  Acute Rehab PT Goals Patient Stated Goal: return home with spouse to assist PT Goal Formulation: With patient Time For Goal Achievement: 04/16/20 Potential to Achieve Goals: Good    Frequency     Barriers to discharge        Co-evaluation               AM-PAC PT "6 Clicks" Mobility  Outcome Measure Help needed turning from your back to your side while in a flat bed without using bedrails?: None Help needed moving from lying on your back to sitting on the side of a flat bed without using bedrails?: None Help needed moving to and from a bed to a chair (including a wheelchair)?: None Help needed standing up from a chair using your arms (e.g., wheelchair or bedside chair)?: None Help needed to walk in hospital room?: None Help needed climbing 3-5 steps with a railing? : None 6 Click Score: 24    End of Session   Activity Tolerance: Patient tolerated treatment well Patient left: in chair;with call bell/phone within reach Nurse Communication: Mobility status PT Visit Diagnosis: Unsteadiness on feet (R26.81);Other abnormalities of gait and mobility (R26.89);Difficulty in walking, not elsewhere  classified (R26.2)    Time: [id number] PT Time Calculation (min) (ACUTE ONLY): 22 min   Charges:   PT Evaluation $PT Eval Moderate Complexity: 1 Mod PT Treatments $Therapeutic Activity: 23-37 mins        12:43 PM, 04/16/20 Lonell Grandchild, MPT Physical Therapist with [facility] 336 [phone] office [phone] mobile phone

## 2020-04-16 NOTE — ED Notes (Signed)
ED TO INPATIENT HANDOFF REPORT  ED Nurse Name and Phone #:  [phone]  S Name/Age/Gender David Dougherty 69 y.o. male Room/Bed: APA10/APA10  Code Status   Code Status: Full Code  Home/SNF/Other Home Patient oriented to: self, place, time and situation Is this baseline? Yes   Triage Complete: Triage complete  Chief Complaint Left hemiparesis ([place]) [G81.94] Acute CVA (cerebrovascular accident) [facility] [I63.9]  Triage Note Pt was at his friends house, stated he was standing up and got dizzy and almost fell. States " my friend said my lips were drawing up and I was talking funny"  A/o x 4. LKW 1244    Allergies Allergies  Allergen Reactions  . Reglan [Metoclopramide] Anaphylaxis    Level of Care/Admitting Diagnosis ED Disposition    ED Disposition [facility] Area: [facility] [id number]  Level of Care: Telemetry [5]  Covid Evaluation: Confirmed COVID Negative  Diagnosis: Acute CVA (cerebrovascular accident) [facility]) [id number]  Admitting Physician: Gerlean Ren [facility] [id number]  Attending Physician: Gerlean Ren [facility] [id number]  Estimated length of stay: past midnight tomorrow  Certification:: I certify this patient will need inpatient services for at least 2 midnights       B Medical/Surgery History Past Medical History:  Diagnosis Date  . Atrial fibrillation ([place])   . CAD (coronary artery disease)    70% distal circumflex 11/2014   . Dysrhythmia    AFib  . GERD (gastroesophageal reflux disease)   . Nonischemic cardiomyopathy ([place])   . Tobacco abuse    Snuff   Past Surgical History:  Procedure Laterality Date  . APPENDECTOMY    . CATARACT EXTRACTION W/PHACO Left 11/18/2019   Procedure: CATARACT EXTRACTION PHACO AND INTRAOCULAR LENS PLACEMENT (IOC);  Surgeon: [name], MD;  Location: [facility] ORS;  Service: Ophthalmology;  Laterality: Left;  CDE: 9.66  . CATARACT EXTRACTION W/PHACO Right 12/04/2019   Procedure: CATARACT  EXTRACTION PHACO AND INTRAOCULAR LENS PLACEMENT RIGHT EYE (CDE: 4.56);  Surgeon: [name], MD;  Location: [facility] ORS;  Service: Ophthalmology;  Laterality: Right;  . COLONOSCOPY  2005   Neg  . COLONOSCOPY N/A 01/05/2015   SLF:17 colon polyps removed/moderate sized hemorrhoids  . COLONOSCOPY WITH PROPOFOL N/A 06/19/2018   Procedure: COLONOSCOPY WITH PROPOFOL;  Surgeon: Danie Binder, MD;  Location: [facility] ENDO SUITE;  Service: Endoscopy;  Laterality: N/A;  7:30am  . 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 ESOPHAGOGASTRODUODENOSCOPY (EGD) WITH PROPOFOL N/A 06/19/2018   Procedure: ESOPHAGOGASTRODUODENOSCOPY (EGD) WITH PROPOFOL;  Surgeon: Danie Binder, MD;  Location: [facility] ENDO SUITE;  Service: Endoscopy;  Laterality: N/A;  . HEMORRHOID BANDING N/A 01/05/2015   Procedure: HEMORRHOID BANDING;  Surgeon: Danie Binder, MD;  Location: [facility] ENDO SUITE;  Service: Endoscopy;  Laterality: N/A;  . LEFT AND RIGHT HEART CATHETERIZATION WITH CORONARY ANGIOGRAM N/A 12/03/2014   Procedure: LEFT AND RIGHT HEART CATHETERIZATION WITH CORONARY ANGIOGRAM;  Surgeon: Blane Ohara, MD;  Location: [facility] CATH LAB;  Service: Cardiovascular;  Laterality: N/A;  . POLYPECTOMY  06/19/2018   Procedure: POLYPECTOMY;  Surgeon: Danie Binder, MD;  Location: [facility] ENDO SUITE;  Service: Endoscopy;;  rectal   . SAVORY DILATION  06/19/2018   Procedure: SAVORY DILATION;  Surgeon: Danie Binder, MD;  Location: [facility] ENDO SUITE;  Service: Endoscopy;;  . TONSILLECTOMY       A IV Location/Drains/Wounds Patient Lines/Drains/Airways Status   Active Line/Drains/Airways    Name:  Placement date:   Placement time:   Site:   Days:   Peripheral IV 04/15/20 Left Antecubital   04/15/20    1351    Antecubital   1   Peripheral IV 04/15/20 Right Hand   04/15/20    1420    Hand   1   Incision (Closed) 11/18/19 Eye Left   11/18/19    1033      150   Incision (Closed) 12/04/19 Eye Right   12/04/19    0906     134          Intake/Output Last 24 hours No intake or output data in the 24 hours ending 04/16/20 1233  Labs/Imaging Results for orders placed or performed during the hospital encounter of 04/15/20 (from the past 48 hour(s))  Ethanol     Status: None   Collection Time: 04/15/20  1:51 PM  Result Value Ref Range   Alcohol, Ethyl (B) <10 <10 mg/dL    Comment: (NOTE) Lowest detectable limit for serum alcohol is 10 mg/dL. For medical purposes only. Performed at [facility], [address] [place], [place] [address]   Protime-INR     Status: None   Collection Time: 04/15/20  1:51 PM  Result Value Ref Range   Prothrombin Time 12.8 11.4 - 15.2 seconds   INR 1.0 0.8 - 1.2    Comment: (NOTE) INR goal varies based on device and disease states. Performed at [facility], [address] [place], [place] [address]   APTT     Status: None   Collection Time: 04/15/20  1:51 PM  Result Value Ref Range   aPTT 30 24 - 36 seconds    Comment: Performed at [facility], [address] [place], [place] [address]  CBC     Status: None   Collection Time: 04/15/20  1:51 PM  Result Value Ref Range   WBC 5.0 4.0 - 10.5 K/uL   RBC 5.04 4.22 - 5.81 MIL/uL   Hemoglobin 14.9 13.0 - 17.0 g/dL   HCT 46.3 39.0 - 52.0 %   MCV 91.9 80.0 - 100.0 fL   MCH 29.6 26.0 - 34.0 pg   MCHC 32.2 30.0 - 36.0 g/dL   RDW 12.8 11.5 - 15.5 %   Platelets 202 150 - 400 K/uL   nRBC 0.0 0.0 - 0.2 %    Comment: Performed at [facility], [address] [place], [place] [address]  Differential     Status: None   Collection Time: 04/15/20  1:51 PM  Result Value Ref Range   Neutrophils Relative % 57 %   Neutro Abs 2.8 1.7 - 7.7 K/uL   Lymphocytes Relative 32 %   Lymphs Abs 1.6 0.7 - 4.0 K/uL   Monocytes Relative 7 %   Monocytes Absolute 0.4 0.1 - 1.0 K/uL   Eosinophils Relative 4 %   Eosinophils Absolute 0.2 0.0 - 0.5 K/uL   Basophils Relative 0 %    Basophils Absolute 0.0 0.0 - 0.1 K/uL   Immature Granulocytes 0 %   Abs Immature Granulocytes 0.01 0.00 - 0.07 K/uL    Comment: Performed at [facility], [address], [place], [place] [address]  Comprehensive metabolic panel     Status: Abnormal   Collection Time: 04/15/20  1:51 PM  Result Value Ref Range   Sodium 139 135 - 145 mmol/L   Potassium 3.7 3.5 - 5.1 mmol/L   Chloride 108 98 - 111 mmol/L   CO2 23 22 -  32 mmol/L   Glucose, Bld 95 70 - 99 mg/dL    Comment: Glucose reference range applies only to samples taken after fasting for at least 8 hours.   BUN 16 8 - 23 mg/dL   Creatinine, Ser 1.06 0.61 - 1.24 mg/dL   Calcium 8.8 (L) 8.9 - 10.3 mg/dL   Total Protein 7.5 6.5 - 8.1 g/dL   Albumin 4.3 3.5 - 5.0 g/dL   AST 20 15 - 41 U/L   ALT 20 0 - 44 U/L   Alkaline Phosphatase 72 38 - 126 U/L   Total Bilirubin 0.7 0.3 - 1.2 mg/dL   GFR calc non Af Amer >60 >60 mL/min   GFR calc Af Amer >60 >60 mL/min   Anion gap 8 5 - 15    Comment: Performed at [facility], [address] [place], [place] [address]  HIV Antibody (routine testing w rflx)     Status: None   Collection Time: 04/15/20  1:51 PM  Result Value Ref Range   HIV Screen 4th Generation wRfx Non Reactive Non Reactive    Comment: Performed at [facility], [place] [address], [place], [place] [address]  Hemoglobin A1c     Status: Abnormal   Collection Time: 04/15/20  1:51 PM  Result Value Ref Range   Hgb A1c MFr Bld 6.0 (H) 4.8 - 5.6 %    Comment: (NOTE) Pre diabetes:          5.7%-6.4% Diabetes:              >6.4% Glycemic control for   <7.0% adults with diabetes    Mean Plasma Glucose 125.5 mg/dL    Comment: Performed at [facility] [address], [place], [place] [address]  Urine rapid drug screen (hosp performed)     Status: None   Collection Time: 04/15/20  1:54 PM  Result Value Ref Range   Opiates NONE DETECTED NONE DETECTED   Cocaine NONE DETECTED NONE DETECTED   Benzodiazepines NONE DETECTED  NONE DETECTED   Amphetamines NONE DETECTED NONE DETECTED   Tetrahydrocannabinol NONE DETECTED NONE DETECTED   Barbiturates NONE DETECTED NONE DETECTED    Comment: (NOTE) DRUG SCREEN FOR MEDICAL PURPOSES ONLY.  IF CONFIRMATION IS NEEDED FOR ANY PURPOSE, NOTIFY LAB WITHIN 5 DAYS. LOWEST DETECTABLE LIMITS FOR URINE DRUG SCREEN Drug Class                     Cutoff (ng/mL) Amphetamine and metabolites    1000 Barbiturate and metabolites    200 Benzodiazepine                 [id number] Tricyclics and metabolites     300 Opiates and metabolites        300 Cocaine and metabolites        300 THC                            50 Performed at [facility], [address], [place], [place] [address]   Urinalysis, Routine w reflex microscopic     Status: Abnormal   Collection Time: 04/15/20  1:54 PM  Result Value Ref Range   Color, Urine COLORLESS (A) YELLOW   APPearance CLEAR CLEAR   Specific Gravity, Urine 1.019 1.005 - 1.030   pH 7.0 5.0 - 8.0   Glucose, UA NEGATIVE NEGATIVE mg/dL   Hgb urine dipstick NEGATIVE NEGATIVE   Bilirubin  Urine NEGATIVE NEGATIVE   Ketones, ur NEGATIVE NEGATIVE mg/dL   Protein, ur NEGATIVE NEGATIVE mg/dL   Nitrite NEGATIVE NEGATIVE   Leukocytes,Ua NEGATIVE NEGATIVE    Comment: Performed at [facility], [address] [place], [place] [address]  I-stat chem 8, ED     Status: None   Collection Time: 04/15/20  1:58 PM  Result Value Ref Range   Sodium 142 135 - 145 mmol/L   Potassium 3.8 3.5 - 5.1 mmol/L   Chloride 109 98 - 111 mmol/L   BUN 15 8 - 23 mg/dL   Creatinine, Ser 1.00 0.61 - 1.24 mg/dL   Glucose, Bld 89 70 - 99 mg/dL    Comment: Glucose reference range applies only to samples taken after fasting for at least 8 hours.   Calcium, Ion 1.22 1.15 - 1.40 mmol/L   TCO2 22 22 - 32 mmol/L   Hemoglobin 16.0 13.0 - 17.0 g/dL   HCT 47.0 39.0 - 52.0 %  CBG monitoring, ED     Status: Abnormal   Collection Time: 04/15/20  2:05 PM  Result Value Ref Range    Glucose-Capillary 112 (H) 70 - 99 mg/dL    Comment: Glucose reference range applies only to samples taken after fasting for at least 8 hours.  SARS Coronavirus 2 by RT PCR (hospital order, performed in [facility] hospital lab) Nasopharyngeal Nasopharyngeal Swab     Status: None   Collection Time: 04/15/20  2:44 PM   Specimen: Nasopharyngeal Swab  Result Value Ref Range   SARS Coronavirus 2 NEGATIVE NEGATIVE    Comment: (NOTE) SARS-CoV-2 target nucleic acids are NOT DETECTED. The SARS-CoV-2 RNA is generally detectable in upper and lower respiratory specimens during the acute phase of infection. The lowest concentration of SARS-CoV-2 viral copies this assay can detect is 250 copies / mL. A negative result does not preclude SARS-CoV-2 infection and should not be used as the sole basis for treatment or other patient management decisions.  A negative result may occur with improper specimen collection / handling, submission of specimen other than nasopharyngeal swab, presence of viral mutation(s) within the areas targeted by this assay, and inadequate number of viral copies (<250 copies / mL). A negative result must be combined with clinical observations, patient history, and epidemiological information. Fact Sheet for Patients:   StrictlyIdeas.no Fact Sheet for Healthcare Providers: BankingDealers.co.za This test is not yet approved or cleared  by the [place] FDA and has been authorized for detection and/or diagnosis of SARS-CoV-2 by FDA under an Emergency Use Authorization (EUA).  This EUA will remain in effect (meaning this test can be used) for the duration of the COVID-19 declaration under Section 564(b)(1) of the Act, 21 U.S.C. section 360bbb-3(b)(1), unless the authorization is terminated or revoked sooner. Performed at [facility], [address] [place], [place] [address]   Lipid panel     Status: Abnormal   Collection Time:  04/16/20  5:16 AM  Result Value Ref Range   Cholesterol 163 0 - 200 mg/dL   Triglycerides 157 (H) <150 mg/dL   HDL 34 (L) >40 mg/dL   Total CHOL/HDL Ratio 4.8 RATIO   VLDL 31 0 - 40 mg/dL   LDL Cholesterol 98 0 - 99 mg/dL    Comment:        Total Cholesterol/HDL:CHD Risk Coronary Heart Disease Risk Table                     Men   Women  1/2 Average Risk   3.4   3.3  Average Risk       5.0   4.4  2 X Average Risk   9.6   7.1  3 X Average Risk  23.4   11.0        Use the calculated Patient Ratio above and the CHD Risk Table to determine the patient's CHD Risk.        ATP III CLASSIFICATION (LDL):  <100     mg/dL   Optimal  100-129  mg/dL   Near or Above                    Optimal  130-159  mg/dL   Borderline  160-189  mg/dL   High  >190     mg/dL   Very High Performed at [facility] [place], [place] [address]    DG Chest 2 View  Result Date: 04/15/2020 CLINICAL DATA:  Recent stroke EXAM: CHEST - 2 VIEW COMPARISON:  11/18/2015 FINDINGS: Cardiac shadow remains enlarged. The lungs are well aerated bilaterally. No focal infiltrate or sizable effusion is seen. Degenerative changes of the thoracic spine are noted. IMPRESSION: No acute abnormality noted. Electronically Signed   By: Inez Catalina M.D.   On: 04/15/2020 21:11   MR ANGIO HEAD WO CONTRAST  Result Date: 04/15/2020 CLINICAL DATA:  Stroke follow-up. Dizziness and left facial droop EXAM: MRI HEAD WITHOUT CONTRAST MRA HEAD WITHOUT CONTRAST TECHNIQUE: Multiplanar, multiecho pulse sequences of the brain and surrounding structures were obtained without intravenous contrast. Angiographic images of the head were obtained using MRA technique without contrast. COMPARISON:  CTA head neck 04/15/2020 FINDINGS: MRI HEAD FINDINGS Brain: Small area of abnormal diffusion restriction within the lateral right thalamus, along the posterior limb of the right internal capsule. No other diffusion abnormality. No acute hemorrhage. No  midline shift or other mass effect. Normal white matter signal. Normal volume of CSF spaces. No chronic microhemorrhage. Normal midline structures. Vascular: Normal flow voids. Skull and upper cervical spine: Normal marrow signal. Sinuses/Orbits: Negative. Other: None. MRA HEAD FINDINGS POSTERIOR CIRCULATION: --Vertebral arteries: Limited flow related enhancement of the left V4 segment. --Inferior cerebellar arteries: Normal. --Basilar artery: Normal. --Superior cerebellar arteries: Normal. --Posterior cerebral arteries: Normal. There are bilateral posterior communicating arteries (p-comm) that partially supply the PCAs. ANTERIOR CIRCULATION: --Intracranial internal carotid arteries: Normal. --Anterior cerebral arteries (ACA): Normal. Both A1 segments are present. Patent anterior communicating artery (a-comm). --Middle cerebral arteries (MCA): Normal. IMPRESSION: 1. Small acute infarct of the lateral right thalamus, along the posterior limb of the right internal capsule. No hemorrhage or mass effect. 2. Diminished flow-related enhancement of the left vertebral artery V4 segment, likely indicating stenosis. Otherwise normal intracranial MRA. Electronically Signed   By: Ulyses Jarred M.D.   On: 04/15/2020 20:58   MR BRAIN WO CONTRAST  Result Date: 04/15/2020 CLINICAL DATA:  Stroke follow-up. Dizziness and left facial droop EXAM: MRI HEAD WITHOUT CONTRAST MRA HEAD WITHOUT CONTRAST TECHNIQUE: Multiplanar, multiecho pulse sequences of the brain and surrounding structures were obtained without intravenous contrast. Angiographic images of the head were obtained using MRA technique without contrast. COMPARISON:  CTA head neck 04/15/2020 FINDINGS: MRI HEAD FINDINGS Brain: Small area of abnormal diffusion restriction within the lateral right thalamus, along the posterior limb of the right internal capsule. No other diffusion abnormality. No acute hemorrhage. No midline shift or other mass effect. Normal white matter  signal. Normal volume of CSF spaces. No chronic microhemorrhage. Normal  midline structures. Vascular: Normal flow voids. Skull and upper cervical spine: Normal marrow signal. Sinuses/Orbits: Negative. Other: None. MRA HEAD FINDINGS POSTERIOR CIRCULATION: --Vertebral arteries: Limited flow related enhancement of the left V4 segment. --Inferior cerebellar arteries: Normal. --Basilar artery: Normal. --Superior cerebellar arteries: Normal. --Posterior cerebral arteries: Normal. There are bilateral posterior communicating arteries (p-comm) that partially supply the PCAs. ANTERIOR CIRCULATION: --Intracranial internal carotid arteries: Normal. --Anterior cerebral arteries (ACA): Normal. Both A1 segments are present. Patent anterior communicating artery (a-comm). --Middle cerebral arteries (MCA): Normal. IMPRESSION: 1. Small acute infarct of the lateral right thalamus, along the posterior limb of the right internal capsule. No hemorrhage or mass effect. 2. Diminished flow-related enhancement of the left vertebral artery V4 segment, likely indicating stenosis. Otherwise normal intracranial MRA. Electronically Signed   By: Ulyses Jarred M.D.   On: 04/15/2020 20:58   US Carotid Bilateral (at [facility] and [facility] only)  Result Date: 04/16/2020 CLINICAL DATA:  Acute ischemic stroke. EXAM: BILATERAL CAROTID DUPLEX ULTRASOUND TECHNIQUE: Pearline Cables scale imaging, color Doppler and duplex ultrasound were performed of bilateral carotid and vertebral arteries in the neck. COMPARISON:  None. FINDINGS: Criteria: Quantification of carotid stenosis is based on velocity parameters that correlate the residual internal carotid diameter with NASCET-based stenosis levels, using the diameter of the distal internal carotid lumen as the denominator for stenosis measurement. The following velocity measurements were obtained: RIGHT ICA: 104/20 cm/sec CCA: [id number] cm/sec SYSTOLIC ICA/CCA RATIO:  1.1 ECA: 111 cm/sec LEFT ICA: 93/31 cm/sec CCA: [id number] cm/sec  SYSTOLIC ICA/CCA RATIO:  1.0 ECA: 98 cm/sec RIGHT CAROTID ARTERY: Intimal thickening in the distal common carotid artery. External carotid artery is patent with normal waveform. Minimal plaque in the proximal internal carotid artery. Normal waveforms and velocities in the internal carotid artery. RIGHT VERTEBRAL ARTERY: Antegrade flow and normal waveform in the right vertebral artery. LEFT CAROTID ARTERY: External carotid artery is patent with normal waveform. Minimal plaque in the left internal carotid artery. Normal waveforms and velocities in the internal carotid artery. LEFT VERTEBRAL ARTERY: Antegrade flow and normal waveform in the left vertebral artery. IMPRESSION: 1. Minimal atherosclerotic disease in the carotid arteries. No significant carotid artery stenosis. Estimated degree of stenosis in the internal carotid arteries is less than 50% bilaterally. 2. Patent vertebral arteries with antegrade flow. Electronically Signed   By: Markus Daft M.D.   On: 04/16/2020 12:16   CT CEREBRAL PERFUSION W CONTRAST  Result Date: 04/15/2020 CLINICAL DATA:  Left-sided facial droop, dizziness EXAM: CT ANGIOGRAPHY HEAD AND NECK CT PERFUSION BRAIN TECHNIQUE: Multidetector CT imaging of the head and neck was performed using the standard protocol during bolus administration of intravenous contrast. Multiplanar CT image reconstructions and MIPs were obtained to evaluate the vascular anatomy. Carotid stenosis measurements (when applicable) are obtained utilizing NASCET criteria, using the distal internal carotid diameter as the denominator. Multiphase CT imaging of the brain was performed following IV bolus contrast injection. Subsequent parametric perfusion maps were calculated using RAPID software. CONTRAST:  111mL OMNIPAQUE IOHEXOL 350 MG/ML SOLN COMPARISON:  None. FINDINGS: CT HEAD FINDINGS Brain: No acute intracranial hemorrhage, mass effect, or edema. No new loss of gray-white differentiation since the recent prior  study. Vascular: No new findings. Skull: No new findings. Sinuses/Orbits: No new findings. Other: None. ASPECTS [facility] Stroke Program Early CT Score) - Ganglionic level infarction (caudate, lentiform nuclei, internal capsule, insula, M1-M3 cortex): 7 - Supraganglionic infarction (M4-M6 cortex): 3 Total score (0-10 with 10 being normal): 10 Review of the MIP images confirms the above findings  CTA NECK FINDINGS Aortic arch: Great vessel origins are patent. Right carotid system: Patent. There is no measurable stenosis at the ICA origin. Left carotid system: Patent. There is no measurable stenosis at the ICA origin. Vertebral arteries: Patent and nearly codominant. Plaque at the left vertebral origin causes mild to moderate stenosis. Skeleton: Cervical spine degenerative changes without high-grade osseous encroachment on the spinal canal. Other neck: No mass or adenopathy. Upper chest: No apical lung mass. Review of the MIP images confirms the above findings CTA HEAD FINDINGS Anterior circulation: Intracranial internal carotids are patent with calcified plaque causing mild stenosis. Anterior and middle cerebral arteries are patent. Posterior circulation: Intracranial vertebral arteries, basilar artery, and posterior cerebral arteries are patent. There are patent bilateral posterior communicating arteries. Venous sinuses: As permitted by contrast timing, patent. Review of the MIP images confirms the above findings CT Brain Perfusion Findings: CBF (<30%) Volume: 50mL Perfusion (Tmax>6.0s) volume: 33mL Mismatch Volume: 65mL Infarction Location:None IMPRESSION: No acute intracranial hemorrhage or evidence of acute infarction. ASPECT score is 10. There is no large vessel occlusion or hemodynamically significant stenosis. Perfusion imaging demonstrates no evidence of core infarction or penumbra. Electronically Signed   By: Macy Mis M.D.   On: 04/15/2020 15:56   ECHOCARDIOGRAM COMPLETE  Result Date: 04/16/2020     ECHOCARDIOGRAM REPORT   Patient Name:   WANDA BARRONS Date of Exam: 04/16/2020 Medical Rec #:  [id number]          Height:       73.0 in Accession #:    [id number]         Weight:       230.2 lb Date of Birth:  1950/12/12         BSA:          2.284 m Patient Age:    35 years           BP:           120/82 mmHg Patient Gender: M                  HR:           89 bpm. Exam Location:  [facility] Procedure: 2D Echo, Cardiac Doppler and Color Doppler Indications:    Stroke 434.91 / I163.9  History:        Patient has prior history of Echocardiogram examinations, most                 recent 03/12/2015. Cardiomyopathy, CAD, Arrythmias:Atrial                 Fibrillation; Risk Factors:Current Smoker and GERD.  Sonographer:    Jonelle Sidle Dance Referring Phys: Navarro.Ahr DAVID TAT IMPRESSIONS  1. Left ventricular ejection fraction, by estimation, is 50 to 55%. The left ventricle has low normal function. The left ventricle has no regional wall motion abnormalities. There is mild concentric left ventricular hypertrophy. Left ventricular diastolic parameters are indeterminate.  2. Right ventricular systolic function is low normal. The right ventricular size is normal.  3. Right atrial size was mildly dilated.  4. The mitral valve is grossly normal. Trivial mitral valve regurgitation.  5. The aortic valve is tricuspid. Aortic valve regurgitation is not visualized. No aortic stenosis is present.  6. Aortic dilatation noted. There is mild dilatation of the aortic root.  7. The inferior vena cava is normal in size with greater than 50% respiratory variability, suggesting right atrial pressure of 3 mmHg. FINDINGS  Left  Ventricle: Left ventricular ejection fraction, by estimation, is 50 to 55%. The left ventricle has low normal function. The left ventricle has no regional wall motion abnormalities. The left ventricular internal cavity size was normal in size. There is mild concentric left ventricular hypertrophy. Left ventricular diastolic  parameters are indeterminate. Right Ventricle: The right ventricular size is normal. No increase in right ventricular wall thickness. Right ventricular systolic function is low normal. Left Atrium: Left atrial size was normal in size. Right Atrium: Right atrial size was mildly dilated. Pericardium: There is no evidence of pericardial effusion. Mitral Valve: The mitral valve is grossly normal. Mild mitral annular calcification. Trivial mitral valve regurgitation. Tricuspid Valve: The tricuspid valve is grossly normal. Tricuspid valve regurgitation is not demonstrated. Aortic Valve: The aortic valve is tricuspid. Aortic valve regurgitation is not visualized. No aortic stenosis is present. Mild aortic valve annular calcification. Pulmonic Valve: The pulmonic valve was grossly normal. Pulmonic valve regurgitation is not visualized. Aorta: Aortic dilatation noted. There is mild dilatation of the aortic root. Venous: The inferior vena cava is normal in size with greater than 50% respiratory variability, suggesting right atrial pressure of 3 mmHg. IAS/Shunts: No atrial level shunt detected by color flow Doppler.  LEFT VENTRICLE PLAX 2D LVIDd:         5.03 cm LVIDs:         3.75 cm LV PW:         1.30 cm LV IVS:        1.23 cm LVOT diam:     2.20 cm LV SV:         51 LV SV Index:   22 LVOT Area:     3.80 cm  RIGHT VENTRICLE RV Basal diam:  2.97 cm RV S prime:     9.36 cm/s TAPSE (M-mode): 1.8 cm LEFT ATRIUM             Index       RIGHT ATRIUM           Index LA diam:        4.60 cm 2.01 cm/m  RA Area:     19.90 cm LA Vol (A2C):   71.5 ml 31.30 ml/m RA Volume:   51.10 ml  22.37 ml/m LA Vol (A4C):   60.1 ml 26.31 ml/m LA Biplane Vol: 68.5 ml 29.99 ml/m  AORTIC VALVE LVOT Vmax:   79.55 cm/s LVOT Vmean:  46.450 cm/s LVOT VTI:    0.133 m  AORTA Ao Root diam: 3.80 cm Ao Asc diam:  3.50 cm MITRAL VALVE MV Area (PHT): 3.42 cm    SHUNTS MV Decel Time: 222 msec    Systemic VTI:  0.13 m MV E velocity: 97.65 cm/s  Systemic  Diam: 2.20 cm Kate Sable MD Electronically signed by Kate Sable MD Signature Date/Time: 04/16/2020/10:42:38 AM    Final    CT HEAD CODE STROKE WO CONTRAST  Result Date: 04/15/2020 CLINICAL DATA:  Code stroke. Focal neuro deficit greater than 6 hours. Left leg weakness and slurred speech EXAM: CT HEAD WITHOUT CONTRAST TECHNIQUE: Contiguous axial images were obtained from the base of the skull through the vertex without intravenous contrast. COMPARISON:  CT head 05/14/2017 FINDINGS: Brain: No evidence of acute infarction, hemorrhage, hydrocephalus, extra-axial collection or mass lesion/mass effect. Vascular: Negative for hyperdense vessel Skull: Negative Sinuses/Orbits: Mucosal edema paranasal sinuses. Hyperdense secretions in the sphenoid sinus may represent fungal infection. Bilateral cataract extraction. Other: None ASPECTS ([place] Stroke Program Early CT Score) - Ganglionic  level infarction (caudate, lentiform nuclei, internal capsule, insula, M1-M3 cortex): 7 - Supraganglionic infarction (M4-M6 cortex): 3 Total score (0-10 with 10 being normal): 10 IMPRESSION: 1. No acute intracranial abnormality 2. ASPECTS is 10 3. These results were called by telephone at the time of interpretation on 04/15/2020 at 2:09 pm to provider JOSHUA LONG , who verbally acknowledged these results. Electronically Signed   By: Franchot Gallo M.D.   On: 04/15/2020 14:10   CT ANGIO HEAD CODE STROKE  Result Date: 04/15/2020 CLINICAL DATA:  Left-sided facial droop, dizziness EXAM: CT ANGIOGRAPHY HEAD AND NECK CT PERFUSION BRAIN TECHNIQUE: Multidetector CT imaging of the head and neck was performed using the standard protocol during bolus administration of intravenous contrast. Multiplanar CT image reconstructions and MIPs were obtained to evaluate the vascular anatomy. Carotid stenosis measurements (when applicable) are obtained utilizing NASCET criteria, using the distal internal carotid diameter as the denominator.  Multiphase CT imaging of the brain was performed following IV bolus contrast injection. Subsequent parametric perfusion maps were calculated using RAPID software. CONTRAST:  197mL OMNIPAQUE IOHEXOL 350 MG/ML SOLN COMPARISON:  None. FINDINGS: CT HEAD FINDINGS Brain: No acute intracranial hemorrhage, mass effect, or edema. No new loss of gray-white differentiation since the recent prior study. Vascular: No new findings. Skull: No new findings. Sinuses/Orbits: No new findings. Other: None. ASPECTS ([place] Stroke Program Early CT Score) - Ganglionic level infarction (caudate, lentiform nuclei, internal capsule, insula, M1-M3 cortex): 7 - Supraganglionic infarction (M4-M6 cortex): 3 Total score (0-10 with 10 being normal): 10 Review of the MIP images confirms the above findings CTA NECK FINDINGS Aortic arch: Great vessel origins are patent. Right carotid system: Patent. There is no measurable stenosis at the ICA origin. Left carotid system: Patent. There is no measurable stenosis at the ICA origin. Vertebral arteries: Patent and nearly codominant. Plaque at the left vertebral origin causes mild to moderate stenosis. Skeleton: Cervical spine degenerative changes without high-grade osseous encroachment on the spinal canal. Other neck: No mass or adenopathy. Upper chest: No apical lung mass. Review of the MIP images confirms the above findings CTA HEAD FINDINGS Anterior circulation: Intracranial internal carotids are patent with calcified plaque causing mild stenosis. Anterior and middle cerebral arteries are patent. Posterior circulation: Intracranial vertebral arteries, basilar artery, and posterior cerebral arteries are patent. There are patent bilateral posterior communicating arteries. Venous sinuses: As permitted by contrast timing, patent. Review of the MIP images confirms the above findings CT Brain Perfusion Findings: CBF (<30%) Volume: 36mL Perfusion (Tmax>6.0s) volume: 43mL Mismatch Volume: 9mL Infarction  Location:None IMPRESSION: No acute intracranial hemorrhage or evidence of acute infarction. ASPECT score is 10. There is no large vessel occlusion or hemodynamically significant stenosis. Perfusion imaging demonstrates no evidence of core infarction or penumbra. Electronically Signed   By: Macy Mis M.D.   On: 04/15/2020 15:56   CT ANGIO NECK CODE STROKE  Result Date: 04/15/2020 CLINICAL DATA:  Left-sided facial droop, dizziness EXAM: CT ANGIOGRAPHY HEAD AND NECK CT PERFUSION BRAIN TECHNIQUE: Multidetector CT imaging of the head and neck was performed using the standard protocol during bolus administration of intravenous contrast. Multiplanar CT image reconstructions and MIPs were obtained to evaluate the vascular anatomy. Carotid stenosis measurements (when applicable) are obtained utilizing NASCET criteria, using the distal internal carotid diameter as the denominator. Multiphase CT imaging of the brain was performed following IV bolus contrast injection. Subsequent parametric perfusion maps were calculated using RAPID software. CONTRAST:  156mL OMNIPAQUE IOHEXOL 350 MG/ML SOLN COMPARISON:  None. FINDINGS: CT HEAD  FINDINGS Brain: No acute intracranial hemorrhage, mass effect, or edema. No new loss of gray-white differentiation since the recent prior study. Vascular: No new findings. Skull: No new findings. Sinuses/Orbits: No new findings. Other: None. ASPECTS ([place] Stroke Program Early CT Score) - Ganglionic level infarction (caudate, lentiform nuclei, internal capsule, insula, M1-M3 cortex): 7 - Supraganglionic infarction (M4-M6 cortex): 3 Total score (0-10 with 10 being normal): 10 Review of the MIP images confirms the above findings CTA NECK FINDINGS Aortic arch: Great vessel origins are patent. Right carotid system: Patent. There is no measurable stenosis at the ICA origin. Left carotid system: Patent. There is no measurable stenosis at the ICA origin. Vertebral arteries: Patent and nearly  codominant. Plaque at the left vertebral origin causes mild to moderate stenosis. Skeleton: Cervical spine degenerative changes without high-grade osseous encroachment on the spinal canal. Other neck: No mass or adenopathy. Upper chest: No apical lung mass. Review of the MIP images confirms the above findings CTA HEAD FINDINGS Anterior circulation: Intracranial internal carotids are patent with calcified plaque causing mild stenosis. Anterior and middle cerebral arteries are patent. Posterior circulation: Intracranial vertebral arteries, basilar artery, and posterior cerebral arteries are patent. There are patent bilateral posterior communicating arteries. Venous sinuses: As permitted by contrast timing, patent. Review of the MIP images confirms the above findings CT Brain Perfusion Findings: CBF (<30%) Volume: 50mL Perfusion (Tmax>6.0s) volume: 12mL Mismatch Volume: 73mL Infarction Location:None IMPRESSION: No acute intracranial hemorrhage or evidence of acute infarction. ASPECT score is 10. There is no large vessel occlusion or hemodynamically significant stenosis. Perfusion imaging demonstrates no evidence of core infarction or penumbra. Electronically Signed   By: Macy Mis M.D.   On: 04/15/2020 15:56    Pending Labs Unresulted Labs (From admission, onward)    Start     Ordered   04/17/20 [id number]  Basic metabolic panel  Daily,   R    Question:  Specimen collection method  Answer:  Lab=Lab collect   04/16/20 1227   04/17/20 0500  CBC  Daily,   R    Question:  Specimen collection method  Answer:  Lab=Lab collect   04/16/20 1227   04/17/20 0500  Magnesium  Daily,   R    Question:  Specimen collection method  Answer:  Lab=Lab collect   04/16/20 1227          Vitals/Pain Today's Vitals   04/16/20 0836 04/16/20 0900 04/16/20 1000 04/16/20 1100  BP: 131/89 120/82 123/78 (!) 130/91  Pulse: 80 68 75 97  Resp: 16 17 16 18   Temp: 97.7 F (36.5 C)     TempSrc: Oral     SpO2: 97% 98% 98% 98%   Weight:      Height:      PainSc: 0-No pain       Isolation Precautions No active isolations  Medications Medications  aspirin EC tablet 325 mg (325 mg Oral Given 04/16/20 1001)  furosemide (LASIX) tablet 20 mg (20 mg Oral Given 04/16/20 1001)  pantoprazole (PROTONIX) EC tablet 40 mg (40 mg Oral Given 04/16/20 1001)  calcium carbonate (TUMS - dosed in mg elemental calcium) chewable tablet 400-600 mg of elemental calcium (has no administration in time range)  cholecalciferol (VITAMIN D3) tablet 15,000 Units (15,000 Units Oral Not Given 04/16/20 1149)  topiramate (TOPAMAX) tablet 50 mg (50 mg Oral Given 04/16/20 1000)   stroke: mapping our early stages of recovery book (0 each Does not apply Hold 04/15/20 2022)  0.9 %  sodium chloride infusion (  Intravenous Not Given 04/15/20 2023)  acetaminophen (TYLENOL) tablet 650 mg (has no administration in time range)    Or  acetaminophen (TYLENOL) 160 MG/5ML solution 650 mg (has no administration in time range)    Or  acetaminophen (TYLENOL) suppository 650 mg (has no administration in time range)  senna-docusate (Senokot-S) tablet 1 tablet (has no administration in time range)  enoxaparin (LOVENOX) injection 40 mg (40 mg Subcutaneous Given 04/15/20 2101)  hydrALAZINE (APRESOLINE) injection 10 mg (has no administration in time range)  iohexol (OMNIPAQUE) 350 MG/ML injection 100 mL (100 mLs Intravenous Contrast Given 04/15/20 1511)    Mobility walks Low fall risk   Focused Assessments    R Recommendations: See Admitting Provider Note  Report given to:   Additional Notes:

## 2020-04-16 NOTE — Progress Notes (Signed)
PROGRESS NOTE    David Dougherty  [id number] DOB: Nov 22, 1951 DOA: 04/15/2020 PCP: Kathyrn Drown, MD   Brief Narrative:  69 year old history of chronic atrial fibrillation, CAD, nonischemic cardiomyopathy EF 40%, HTN, HLD presented with left-sided hemiparesis, and dysarthric speech that started around 1230 on 04/15/2020.  Upon admission to the hospital he was offered TPA but patient refused.  Extensive work-up confirmed on MRI that he had a right thalamic infarct.   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  MRI of the brain confirmed right acute lateral thalamic infarct. LDL 98, A1c 6.0 Echocardiogram-pending Currently on full dose aspirin. Neurology consulted PT/OT. Carotid Dopplers as minimal stenosis  Chronic atrial fibrillation, permanent Anticoagulation stopped about 2 years ago.  Currently on Coreg. He would benefit from full dose anticoagulation.  Patient would like to think about this.  Previously documented by PCP that patient also does not want to be in any sort of long-term anticoagulation.  Nonischemic cardiomyopathy with ejection fraction 40% Echocardiogram from 2011.  Will need repeat echocardiogram. Currently appears to be euvolemic and without chest pain.  Essential hypertension Permissive hypertension  History of daily headache On Topamax.  DVT prophylaxis: Lovenox Code Status: Full code Family Communication: Wife at bedside  Status is: Inpatient  Remains inpatient appropriate because: on going cva evaluation.    Dispo: The patient is from: Home              Anticipated d/c is to: Home              Anticipated d/c date is: 2 days              Patient currently is not  medically stable to d/c.  Ongoing work-up for acute CVA.  Pending neurology evaluation.   Subjective: Felt okay no complaints.  States his weakness and facial droop is better.  Still having numbness.  Review of Systems Otherwise negative except as per HPI, including: General: Denies fever, chills, night sweats or unintended weight loss. Resp: Denies cough, wheezing, shortness of breath. Cardiac: Denies chest pain, palpitations, orthopnea, paroxysmal nocturnal dyspnea. GI: Denies abdominal pain, nausea, vomiting, diarrhea or constipation GU: Denies dysuria, frequency, hesitancy or incontinence MS: Denies muscle aches, joint pain or swelling Neuro: Denies headache, neurologic deficits (focal weakness, numbness, tingling), abnormal gait Psych: Denies anxiety, depression, SI/HI/AVH Skin: Denies new rashes or lesions ID: Denies sick contacts, exotic exposures, travel  Examination:  Constitutional: Not in acute distress Respiratory: Clear to auscultation bilaterally Cardiovascular: Normal sinus rhythm, no rubs Abdomen: Nontender nondistended good bowel sounds Musculoskeletal: No edema noted Skin: No rashes seen Neurologic: CN 2-12 grossly intact.  And nonfocal Psychiatric: Normal judgment and insight. Alert and oriented x 3. Normal mood.   Objective: Vitals:   04/16/20 0836 04/16/20 0900 04/16/20 1000 04/16/20 1100  BP: 131/89 120/82 123/78 (!) 130/91  Pulse: 80 68 75 97  Resp: 16 17 16 18   Temp: 97.7 F (36.5 C)     TempSrc: Oral     SpO2: 97% 98% 98% 98%  Weight:      Height:       No intake or output data in the 24 hours ending 04/16/20 1224 Filed Weights   04/15/20 1350 04/15/20  1424  Weight: 95.3 kg 104.4 kg     Data Reviewed:   CBC: Recent Labs  Lab 04/15/20 1351 04/15/20 1358  WBC 5.0  --   NEUTROABS 2.8  --   HGB 14.9 16.0  HCT 46.3 47.0  MCV 91.9  --   PLT 202  --    Basic Metabolic Panel: Recent Labs  Lab 04/15/20 1351 04/15/20 1358  NA 139  142  K 3.7 3.8  CL 108 109  CO2 23  --   GLUCOSE 95 89  BUN 16 15  CREATININE 1.06 1.00  CALCIUM 8.8*  --    GFR: Estimated Creatinine Clearance: 88.5 mL/min (by C-G formula based on SCr of 1 mg/dL). Liver Function Tests: Recent Labs  Lab 04/15/20 1351  AST 20  ALT 20  ALKPHOS 72  BILITOT 0.7  PROT 7.5  ALBUMIN 4.3   No results for input(s): LIPASE, AMYLASE in the last 168 hours. No results for input(s): AMMONIA in the last 168 hours. Coagulation Profile: Recent Labs  Lab 04/15/20 1351  INR 1.0   Cardiac Enzymes: No results for input(s): CKTOTAL, CKMB, CKMBINDEX, TROPONINI in the last 168 hours. BNP (last 3 results) No results for input(s): PROBNP in the last 8760 hours. HbA1C: Recent Labs    04/15/20 1351  HGBA1C 6.0*   CBG: Recent Labs  Lab 04/15/20 1405  GLUCAP 112*   Lipid Profile: Recent Labs    04/16/20 0516  CHOL 163  HDL 34*  LDLCALC 98  TRIG 157*  CHOLHDL 4.8   Thyroid Function Tests: No results for input(s): TSH, T4TOTAL, FREET4, T3FREE, THYROIDAB in the last 72 hours. Anemia Panel: No results for input(s): VITAMINB12, FOLATE, FERRITIN, TIBC, IRON, RETICCTPCT in the last 72 hours. Sepsis Labs: No results for input(s): PROCALCITON, LATICACIDVEN in the last 168 hours.  Recent Results (from the past 240 hour(s))  SARS Coronavirus 2 by RT PCR (hospital order, performed in [facility] hospital lab) Nasopharyngeal Nasopharyngeal Swab     Status: None   Collection Time: 04/15/20  2:44 PM   Specimen: Nasopharyngeal Swab  Result Value Ref Range Status   SARS Coronavirus 2 NEGATIVE NEGATIVE Final    Comment: (NOTE) SARS-CoV-2 target nucleic acids are NOT DETECTED. The SARS-CoV-2 RNA is generally detectable in upper and lower respiratory specimens during the acute phase of infection. The lowest concentration of SARS-CoV-2 viral copies this assay can detect is 250 copies / mL. A negative result does not preclude SARS-CoV-2 infection and should  not be used as the sole basis for treatment or other patient management decisions.  A negative result may occur with improper specimen collection / handling, submission of specimen other than nasopharyngeal swab, presence of viral mutation(s) within the areas targeted by this assay, and inadequate number of viral copies (<250 copies / mL). A negative result must be combined with clinical observations, patient history, and epidemiological information. Fact Sheet for Patients:   StrictlyIdeas.no Fact Sheet for Healthcare Providers: BankingDealers.co.za This test is not yet approved or cleared  by the [place] FDA and has been authorized for detection and/or diagnosis of SARS-CoV-2 by FDA under an Emergency Use Authorization (EUA).  This EUA will remain in effect (meaning this test can be used) for the duration of the COVID-19 declaration under Section 564(b)(1) of the Act, 21 U.S.C. section 360bbb-3(b)(1), unless the authorization is terminated or revoked sooner. Performed at [facility], [address], [place], [place] [address]  Radiology Studies: DG Chest 2 View  Result Date: 04/15/2020 CLINICAL DATA:  Recent stroke EXAM: CHEST - 2 VIEW COMPARISON:  11/18/2015 FINDINGS: Cardiac shadow remains enlarged. The lungs are well aerated bilaterally. No focal infiltrate or sizable effusion is seen. Degenerative changes of the thoracic spine are noted. IMPRESSION: No acute abnormality noted. Electronically Signed   By: Inez Catalina M.D.   On: 04/15/2020 21:11   MR ANGIO HEAD WO CONTRAST  Result Date: 04/15/2020 CLINICAL DATA:  Stroke follow-up. Dizziness and left facial droop EXAM: MRI HEAD WITHOUT CONTRAST MRA HEAD WITHOUT CONTRAST TECHNIQUE: Multiplanar, multiecho pulse sequences of the brain and surrounding structures were obtained without intravenous contrast. Angiographic images of the head were obtained using MRA technique  without contrast. COMPARISON:  CTA head neck 04/15/2020 FINDINGS: MRI HEAD FINDINGS Brain: Small area of abnormal diffusion restriction within the lateral right thalamus, along the posterior limb of the right internal capsule. No other diffusion abnormality. No acute hemorrhage. No midline shift or other mass effect. Normal white matter signal. Normal volume of CSF spaces. No chronic microhemorrhage. Normal midline structures. Vascular: Normal flow voids. Skull and upper cervical spine: Normal marrow signal. Sinuses/Orbits: Negative. Other: None. MRA HEAD FINDINGS POSTERIOR CIRCULATION: --Vertebral arteries: Limited flow related enhancement of the left V4 segment. --Inferior cerebellar arteries: Normal. --Basilar artery: Normal. --Superior cerebellar arteries: Normal. --Posterior cerebral arteries: Normal. There are bilateral posterior communicating arteries (p-comm) that partially supply the PCAs. ANTERIOR CIRCULATION: --Intracranial internal carotid arteries: Normal. --Anterior cerebral arteries (ACA): Normal. Both A1 segments are present. Patent anterior communicating artery (a-comm). --Middle cerebral arteries (MCA): Normal. IMPRESSION: 1. Small acute infarct of the lateral right thalamus, along the posterior limb of the right internal capsule. No hemorrhage or mass effect. 2. Diminished flow-related enhancement of the left vertebral artery V4 segment, likely indicating stenosis. Otherwise normal intracranial MRA. Electronically Signed   By: Ulyses Jarred M.D.   On: 04/15/2020 20:58   MR BRAIN WO CONTRAST  Result Date: 04/15/2020 CLINICAL DATA:  Stroke follow-up. Dizziness and left facial droop EXAM: MRI HEAD WITHOUT CONTRAST MRA HEAD WITHOUT CONTRAST TECHNIQUE: Multiplanar, multiecho pulse sequences of the brain and surrounding structures were obtained without intravenous contrast. Angiographic images of the head were obtained using MRA technique without contrast. COMPARISON:  CTA head neck 04/15/2020  FINDINGS: MRI HEAD FINDINGS Brain: Small area of abnormal diffusion restriction within the lateral right thalamus, along the posterior limb of the right internal capsule. No other diffusion abnormality. No acute hemorrhage. No midline shift or other mass effect. Normal white matter signal. Normal volume of CSF spaces. No chronic microhemorrhage. Normal midline structures. Vascular: Normal flow voids. Skull and upper cervical spine: Normal marrow signal. Sinuses/Orbits: Negative. Other: None. MRA HEAD FINDINGS POSTERIOR CIRCULATION: --Vertebral arteries: Limited flow related enhancement of the left V4 segment. --Inferior cerebellar arteries: Normal. --Basilar artery: Normal. --Superior cerebellar arteries: Normal. --Posterior cerebral arteries: Normal. There are bilateral posterior communicating arteries (p-comm) that partially supply the PCAs. ANTERIOR CIRCULATION: --Intracranial internal carotid arteries: Normal. --Anterior cerebral arteries (ACA): Normal. Both A1 segments are present. Patent anterior communicating artery (a-comm). --Middle cerebral arteries (MCA): Normal. IMPRESSION: 1. Small acute infarct of the lateral right thalamus, along the posterior limb of the right internal capsule. No hemorrhage or mass effect. 2. Diminished flow-related enhancement of the left vertebral artery V4 segment, likely indicating stenosis. Otherwise normal intracranial MRA. Electronically Signed   By: Ulyses Jarred M.D.   On: 04/15/2020 20:58   US Carotid Bilateral (at [facility] and [facility] only)  Result Date: 04/16/2020 CLINICAL DATA:  Acute ischemic stroke. EXAM: BILATERAL CAROTID DUPLEX ULTRASOUND TECHNIQUE: Pearline Cables scale imaging, color Doppler and duplex ultrasound were performed of bilateral carotid and vertebral arteries in the neck. COMPARISON:  None. FINDINGS: Criteria: Quantification of carotid stenosis is based on velocity parameters that correlate the residual internal carotid diameter with NASCET-based stenosis levels,  using the diameter of the distal internal carotid lumen as the denominator for stenosis measurement. The following velocity measurements were obtained: RIGHT ICA: 104/20 cm/sec CCA: [id number] cm/sec SYSTOLIC ICA/CCA RATIO:  1.1 ECA: 111 cm/sec LEFT ICA: 93/31 cm/sec CCA: [id number] cm/sec SYSTOLIC ICA/CCA RATIO:  1.0 ECA: 98 cm/sec RIGHT CAROTID ARTERY: Intimal thickening in the distal common carotid artery. External carotid artery is patent with normal waveform. Minimal plaque in the proximal internal carotid artery. Normal waveforms and velocities in the internal carotid artery. RIGHT VERTEBRAL ARTERY: Antegrade flow and normal waveform in the right vertebral artery. LEFT CAROTID ARTERY: External carotid artery is patent with normal waveform. Minimal plaque in the left internal carotid artery. Normal waveforms and velocities in the internal carotid artery. LEFT VERTEBRAL ARTERY: Antegrade flow and normal waveform in the left vertebral artery. IMPRESSION: 1. Minimal atherosclerotic disease in the carotid arteries. No significant carotid artery stenosis. Estimated degree of stenosis in the internal carotid arteries is less than 50% bilaterally. 2. Patent vertebral arteries with antegrade flow. Electronically Signed   By: Markus Daft M.D.   On: 04/16/2020 12:16   CT CEREBRAL PERFUSION W CONTRAST  Result Date: 04/15/2020 CLINICAL DATA:  Left-sided facial droop, dizziness EXAM: CT ANGIOGRAPHY HEAD AND NECK CT PERFUSION BRAIN TECHNIQUE: Multidetector CT imaging of the head and neck was performed using the standard protocol during bolus administration of intravenous contrast. Multiplanar CT image reconstructions and MIPs were obtained to evaluate the vascular anatomy. Carotid stenosis measurements (when applicable) are obtained utilizing NASCET criteria, using the distal internal carotid diameter as the denominator. Multiphase CT imaging of the brain was performed following IV bolus contrast injection. Subsequent parametric  perfusion maps were calculated using RAPID software. CONTRAST:  150mL OMNIPAQUE IOHEXOL 350 MG/ML SOLN COMPARISON:  None. FINDINGS: CT HEAD FINDINGS Brain: No acute intracranial hemorrhage, mass effect, or edema. No new loss of gray-white differentiation since the recent prior study. Vascular: No new findings. Skull: No new findings. Sinuses/Orbits: No new findings. Other: None. ASPECTS ([place] Stroke Program Early CT Score) - Ganglionic level infarction (caudate, lentiform nuclei, internal capsule, insula, M1-M3 cortex): 7 - Supraganglionic infarction (M4-M6 cortex): 3 Total score (0-10 with 10 being normal): 10 Review of the MIP images confirms the above findings CTA NECK FINDINGS Aortic arch: Great vessel origins are patent. Right carotid system: Patent. There is no measurable stenosis at the ICA origin. Left carotid system: Patent. There is no measurable stenosis at the ICA origin. Vertebral arteries: Patent and nearly codominant. Plaque at the left vertebral origin causes mild to moderate stenosis. Skeleton: Cervical spine degenerative changes without high-grade osseous encroachment on the spinal canal. Other neck: No mass or adenopathy. Upper chest: No apical lung mass. Review of the MIP images confirms the above findings CTA HEAD FINDINGS Anterior circulation: Intracranial internal carotids are patent with calcified plaque causing mild stenosis. Anterior and middle cerebral arteries are patent. Posterior circulation: Intracranial vertebral arteries, basilar artery, and posterior cerebral arteries are patent. There are patent bilateral posterior communicating arteries. Venous sinuses: As permitted by contrast timing, patent. Review of the MIP images confirms the above findings CT Brain Perfusion Findings: CBF (<30%) Volume: 36mL  Perfusion (Tmax>6.0s) volume: 61mL Mismatch Volume: 32mL Infarction Location:None IMPRESSION: No acute intracranial hemorrhage or evidence of acute infarction. ASPECT score is 10. There  is no large vessel occlusion or hemodynamically significant stenosis. Perfusion imaging demonstrates no evidence of core infarction or penumbra. Electronically Signed   By: Macy Mis M.D.   On: 04/15/2020 15:56   ECHOCARDIOGRAM COMPLETE  Result Date: 04/16/2020    ECHOCARDIOGRAM REPORT   Patient Name:   MIKLOS STEELY Date of Exam: 04/16/2020 Medical Rec #:  [id number]          Height:       73.0 in Accession #:    [id number]         Weight:       230.2 lb Date of Birth:  July 21, 1951         BSA:          2.284 m Patient Age:    44 years           BP:           120/82 mmHg Patient Gender: M                  HR:           89 bpm. Exam Location:  [facility] Procedure: 2D Echo, Cardiac Doppler and Color Doppler Indications:    Stroke 434.91 / I163.9  History:        Patient has prior history of Echocardiogram examinations, most                 recent 03/12/2015. Cardiomyopathy, CAD, Arrythmias:Atrial                 Fibrillation; Risk Factors:Current Smoker and GERD.  Sonographer:    Jonelle Sidle Dance Referring Phys: [email or web address].Ahr DAVID TAT IMPRESSIONS  1. Left ventricular ejection fraction, by estimation, is 50 to 55%. The left ventricle has low normal function. The left ventricle has no regional wall motion abnormalities. There is mild concentric left ventricular hypertrophy. Left ventricular diastolic parameters are indeterminate.  2. Right ventricular systolic function is low normal. The right ventricular size is normal.  3. Right atrial size was mildly dilated.  4. The mitral valve is grossly normal. Trivial mitral valve regurgitation.  5. The aortic valve is tricuspid. Aortic valve regurgitation is not visualized. No aortic stenosis is present.  6. Aortic dilatation noted. There is mild dilatation of the aortic root.  7. The inferior vena cava is normal in size with greater than 50% respiratory variability, suggesting right atrial pressure of 3 mmHg. FINDINGS  Left Ventricle: Left ventricular ejection fraction, by  estimation, is 50 to 55%. The left ventricle has low normal function. The left ventricle has no regional wall motion abnormalities. The left ventricular internal cavity size was normal in size. There is mild concentric left ventricular hypertrophy. Left ventricular diastolic parameters are indeterminate. Right Ventricle: The right ventricular size is normal. No increase in right ventricular wall thickness. Right ventricular systolic function is low normal. Left Atrium: Left atrial size was normal in size. Right Atrium: Right atrial size was mildly dilated. Pericardium: There is no evidence of pericardial effusion. Mitral Valve: The mitral valve is grossly normal. Mild mitral annular calcification. Trivial mitral valve regurgitation. Tricuspid Valve: The tricuspid valve is grossly normal. Tricuspid valve regurgitation is not demonstrated. Aortic Valve: The aortic valve is tricuspid. Aortic valve regurgitation is not visualized. No aortic stenosis is present. Mild aortic valve annular calcification. Pulmonic Valve: The pulmonic  valve was grossly normal. Pulmonic valve regurgitation is not visualized. Aorta: Aortic dilatation noted. There is mild dilatation of the aortic root. Venous: The inferior vena cava is normal in size with greater than 50% respiratory variability, suggesting right atrial pressure of 3 mmHg. IAS/Shunts: No atrial level shunt detected by color flow Doppler.  LEFT VENTRICLE PLAX 2D LVIDd:         5.03 cm LVIDs:         3.75 cm LV PW:         1.30 cm LV IVS:        1.23 cm LVOT diam:     2.20 cm LV SV:         51 LV SV Index:   22 LVOT Area:     3.80 cm  RIGHT VENTRICLE RV Basal diam:  2.97 cm RV S prime:     9.36 cm/s TAPSE (M-mode): 1.8 cm LEFT ATRIUM             Index       RIGHT ATRIUM           Index LA diam:        4.60 cm 2.01 cm/m  RA Area:     19.90 cm LA Vol (A2C):   71.5 ml 31.30 ml/m RA Volume:   51.10 ml  22.37 ml/m LA Vol (A4C):   60.1 ml 26.31 ml/m LA Biplane Vol: 68.5 ml 29.99  ml/m  AORTIC VALVE LVOT Vmax:   79.55 cm/s LVOT Vmean:  46.450 cm/s LVOT VTI:    0.133 m  AORTA Ao Root diam: 3.80 cm Ao Asc diam:  3.50 cm MITRAL VALVE MV Area (PHT): 3.42 cm    SHUNTS MV Decel Time: 222 msec    Systemic VTI:  0.13 m MV E velocity: 97.65 cm/s  Systemic Diam: 2.20 cm Kate Sable MD Electronically signed by Kate Sable MD Signature Date/Time: 04/16/2020/10:42:38 AM    Final    CT HEAD CODE STROKE WO CONTRAST  Result Date: 04/15/2020 CLINICAL DATA:  Code stroke. Focal neuro deficit greater than 6 hours. Left leg weakness and slurred speech EXAM: CT HEAD WITHOUT CONTRAST TECHNIQUE: Contiguous axial images were obtained from the base of the skull through the vertex without intravenous contrast. COMPARISON:  CT head 05/14/2017 FINDINGS: Brain: No evidence of acute infarction, hemorrhage, hydrocephalus, extra-axial collection or mass lesion/mass effect. Vascular: Negative for hyperdense vessel Skull: Negative Sinuses/Orbits: Mucosal edema paranasal sinuses. Hyperdense secretions in the sphenoid sinus may represent fungal infection. Bilateral cataract extraction. Other: None ASPECTS ([place] Stroke Program Early CT Score) - Ganglionic level infarction (caudate, lentiform nuclei, internal capsule, insula, M1-M3 cortex): 7 - Supraganglionic infarction (M4-M6 cortex): 3 Total score (0-10 with 10 being normal): 10 IMPRESSION: 1. No acute intracranial abnormality 2. ASPECTS is 10 3. These results were called by telephone at the time of interpretation on 04/15/2020 at 2:09 pm to provider JOSHUA LONG , who verbally acknowledged these results. Electronically Signed   By: Franchot Gallo M.D.   On: 04/15/2020 14:10   CT ANGIO HEAD CODE STROKE  Result Date: 04/15/2020 CLINICAL DATA:  Left-sided facial droop, dizziness EXAM: CT ANGIOGRAPHY HEAD AND NECK CT PERFUSION BRAIN TECHNIQUE: Multidetector CT imaging of the head and neck was performed using the standard protocol during bolus administration  of intravenous contrast. Multiplanar CT image reconstructions and MIPs were obtained to evaluate the vascular anatomy. Carotid stenosis measurements (when applicable) are obtained utilizing NASCET criteria, using the distal internal carotid diameter as  the denominator. Multiphase CT imaging of the brain was performed following IV bolus contrast injection. Subsequent parametric perfusion maps were calculated using RAPID software. CONTRAST:  143mL OMNIPAQUE IOHEXOL 350 MG/ML SOLN COMPARISON:  None. FINDINGS: CT HEAD FINDINGS Brain: No acute intracranial hemorrhage, mass effect, or edema. No new loss of gray-white differentiation since the recent prior study. Vascular: No new findings. Skull: No new findings. Sinuses/Orbits: No new findings. Other: None. ASPECTS ([place] Stroke Program Early CT Score) - Ganglionic level infarction (caudate, lentiform nuclei, internal capsule, insula, M1-M3 cortex): 7 - Supraganglionic infarction (M4-M6 cortex): 3 Total score (0-10 with 10 being normal): 10 Review of the MIP images confirms the above findings CTA NECK FINDINGS Aortic arch: Great vessel origins are patent. Right carotid system: Patent. There is no measurable stenosis at the ICA origin. Left carotid system: Patent. There is no measurable stenosis at the ICA origin. Vertebral arteries: Patent and nearly codominant. Plaque at the left vertebral origin causes mild to moderate stenosis. Skeleton: Cervical spine degenerative changes without high-grade osseous encroachment on the spinal canal. Other neck: No mass or adenopathy. Upper chest: No apical lung mass. Review of the MIP images confirms the above findings CTA HEAD FINDINGS Anterior circulation: Intracranial internal carotids are patent with calcified plaque causing mild stenosis. Anterior and middle cerebral arteries are patent. Posterior circulation: Intracranial vertebral arteries, basilar artery, and posterior cerebral arteries are patent. There are patent bilateral  posterior communicating arteries. Venous sinuses: As permitted by contrast timing, patent. Review of the MIP images confirms the above findings CT Brain Perfusion Findings: CBF (<30%) Volume: 56mL Perfusion (Tmax>6.0s) volume: 73mL Mismatch Volume: 44mL Infarction Location:None IMPRESSION: No acute intracranial hemorrhage or evidence of acute infarction. ASPECT score is 10. There is no large vessel occlusion or hemodynamically significant stenosis. Perfusion imaging demonstrates no evidence of core infarction or penumbra. Electronically Signed   By: Macy Mis M.D.   On: 04/15/2020 15:56   CT ANGIO NECK CODE STROKE  Result Date: 04/15/2020 CLINICAL DATA:  Left-sided facial droop, dizziness EXAM: CT ANGIOGRAPHY HEAD AND NECK CT PERFUSION BRAIN TECHNIQUE: Multidetector CT imaging of the head and neck was performed using the standard protocol during bolus administration of intravenous contrast. Multiplanar CT image reconstructions and MIPs were obtained to evaluate the vascular anatomy. Carotid stenosis measurements (when applicable) are obtained utilizing NASCET criteria, using the distal internal carotid diameter as the denominator. Multiphase CT imaging of the brain was performed following IV bolus contrast injection. Subsequent parametric perfusion maps were calculated using RAPID software. CONTRAST:  167mL OMNIPAQUE IOHEXOL 350 MG/ML SOLN COMPARISON:  None. FINDINGS: CT HEAD FINDINGS Brain: No acute intracranial hemorrhage, mass effect, or edema. No new loss of gray-white differentiation since the recent prior study. Vascular: No new findings. Skull: No new findings. Sinuses/Orbits: No new findings. Other: None. ASPECTS ([place] Stroke Program Early CT Score) - Ganglionic level infarction (caudate, lentiform nuclei, internal capsule, insula, M1-M3 cortex): 7 - Supraganglionic infarction (M4-M6 cortex): 3 Total score (0-10 with 10 being normal): 10 Review of the MIP images confirms the above findings CTA NECK  FINDINGS Aortic arch: Great vessel origins are patent. Right carotid system: Patent. There is no measurable stenosis at the ICA origin. Left carotid system: Patent. There is no measurable stenosis at the ICA origin. Vertebral arteries: Patent and nearly codominant. Plaque at the left vertebral origin causes mild to moderate stenosis. Skeleton: Cervical spine degenerative changes without high-grade osseous encroachment on the spinal canal. Other neck: No mass or adenopathy. Upper chest: No apical  lung mass. Review of the MIP images confirms the above findings CTA HEAD FINDINGS Anterior circulation: Intracranial internal carotids are patent with calcified plaque causing mild stenosis. Anterior and middle cerebral arteries are patent. Posterior circulation: Intracranial vertebral arteries, basilar artery, and posterior cerebral arteries are patent. There are patent bilateral posterior communicating arteries. Venous sinuses: As permitted by contrast timing, patent. Review of the MIP images confirms the above findings CT Brain Perfusion Findings: CBF (<30%) Volume: 58mL Perfusion (Tmax>6.0s) volume: 64mL Mismatch Volume: 70mL Infarction Location:None IMPRESSION: No acute intracranial hemorrhage or evidence of acute infarction. ASPECT score is 10. There is no large vessel occlusion or hemodynamically significant stenosis. Perfusion imaging demonstrates no evidence of core infarction or penumbra. Electronically Signed   By: Macy Mis M.D.   On: 04/15/2020 15:56        Scheduled Meds: .  stroke: mapping our early stages of recovery book   Does not apply Once  . aspirin EC  325 mg Oral Daily  . cholecalciferol  15,000 Units Oral Daily  . enoxaparin (LOVENOX) injection  40 mg Subcutaneous Q24H  . furosemide  20 mg Oral Daily  . pantoprazole  40 mg Oral Daily  . topiramate  50 mg Oral BID   Continuous Infusions: . sodium chloride       LOS: 0 days   Time spent= 25 mins    Olanrewaju Osborn Arsenio Loader,  MD [facility]  If 7PM-7AM, please contact night-coverage  04/16/2020, 12:24 PM

## 2020-04-16 NOTE — Progress Notes (Signed)
  Echocardiogram 2D Echocardiogram has been performed.  Drayce Tawil G Cherissa Hook 04/16/2020, 10:35 AM

## 2020-04-16 NOTE — ED Notes (Signed)
Report to Nancy, RN [id number]  

## 2020-04-16 NOTE — Evaluation (Signed)
Speech Language Pathology Evaluation Patient Details Name: David Dougherty MRN: [id number] DOB: [birth year] Today's Date: 04/16/2020 Time: [id number] SLP Time Calculation (min) (ACUTE ONLY): 35 min  Problem List:  Patient Active Problem List   Diagnosis Date Noted  . Acute CVA (cerebrovascular accident) ([place]) 04/16/2020  . Left hemiparesis ([place]) 04/15/2020  . History of colonic polyps 04/05/2018  . Vitamin D deficiency 01/12/2017  . Osteopenia 11/03/2016  . Cervical pain 11/25/2015  . Cervical spondylosis without myelopathy 11/25/2015  . Hemorrhoids 06/02/2015  . Loose stools 06/02/2015  . Chest pain 05/06/2015  . H/O cardiomyopathy 05/06/2015  . Essential hypertension 05/06/2015  . Chronic systolic congestive heart failure ([place]) 05/06/2015  . Barrett's esophagus   . CAD (coronary artery disease), native coronary artery 12/22/2014  . Rectal bleeding 12/18/2014  . 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 Elevated PSA 11/20/2014  . Hyperlipidemia 11/20/2014  . Elevated fasting glucose 11/20/2014  . Atrial fibrillation ([place]) 08/12/2010  . GASTROESOPHAGEAL REFLUX DISEASE 08/12/2010   Past Medical History:  Past Medical History:  Diagnosis Date  . Atrial fibrillation ([place])   . CAD (coronary artery disease)    70% distal circumflex 11/2014   . Dysrhythmia    AFib  . GERD (gastroesophageal reflux disease)   . Nonischemic cardiomyopathy ([place])   . Tobacco abuse    Snuff   Past Surgical History:  Past Surgical History:  Procedure Laterality Date  . APPENDECTOMY    . CATARACT EXTRACTION W/PHACO Left 11/18/2019   Procedure: CATARACT EXTRACTION PHACO AND INTRAOCULAR LENS PLACEMENT (IOC);  Surgeon: [name], MD;  Location: [facility];  Service: Ophthalmology;  Laterality: Left;  CDE: 9.66  . CATARACT EXTRACTION W/PHACO Right 12/04/2019   Procedure: CATARACT EXTRACTION PHACO AND INTRAOCULAR LENS PLACEMENT RIGHT EYE (CDE: 4.56);  Surgeon: [name], MD;  Location: [facility];  Service: Ophthalmology;  Laterality: Right;  . COLONOSCOPY  2005   Neg  . COLONOSCOPY N/A 01/05/2015   SLF:17 colon polyps removed/moderate sized hemorrhoids  . COLONOSCOPY WITH PROPOFOL N/A 06/19/2018   Procedure: COLONOSCOPY WITH PROPOFOL;  Surgeon: Danie Binder, MD;  Location: [facility];  Service: Endoscopy;  Laterality: N/A;  7:30am  . 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 ESOPHAGOGASTRODUODENOSCOPY (EGD) WITH PROPOFOL N/A 06/19/2018   Procedure: ESOPHAGOGASTRODUODENOSCOPY (EGD) WITH PROPOFOL;  Surgeon: Danie Binder, MD;  Location: [facility];  Service: Endoscopy;  Laterality: N/A;  . HEMORRHOID BANDING N/A 01/05/2015   Procedure: HEMORRHOID BANDING;  Surgeon: Danie Binder, MD;  Location: [facility];  Service: Endoscopy;  Laterality: N/A;  . LEFT AND RIGHT HEART CATHETERIZATION WITH CORONARY ANGIOGRAM N/A 12/03/2014   Procedure: LEFT AND RIGHT HEART CATHETERIZATION WITH CORONARY ANGIOGRAM;  Surgeon: Blane Ohara, MD;  Location: [facility] CATH LAB;  Service: Cardiovascular;  Laterality: N/A;  . POLYPECTOMY  06/19/2018   Procedure: POLYPECTOMY;  Surgeon: Danie Binder, MD;  [facility];  Service: Endoscopy;;  rectal   . SAVORY DILATION  06/19/2018   Procedure: SAVORY DILATION;  Surgeon: Danie Binder, MD;  Location: [facility];  Service: Endoscopy;;  . TONSILLECTOMY     HPI:  David Dougherty is a 69 y.o. male with medical history of chronic atrial fibrillation, coronary disease, GERD, nonischemic cardiomyopathy with EF 40-45%, hypertension, hyperlipidemia presenting with left hemiparesis, word finding difficulty, and dysarthria that began around 12:40 PM on 04/15/2020.  Last known normal was around 11:30 AM  on 04/15/2020.  The patient was speaking with some friends when he began feeling lightheaded.  His friends noted that he had some left facial  droop.  They also noted the patient had some word finding difficulty as well as slurred speech.  The patient felt dizzy and went to sit down.  When he tried to get up, he felt weak in his legs and was leaning toward the left.  He subsequently stated that he felt left leg weakness and left arm weakness.  He arrived to the emergency department by private vehicle.  In the emergency department, his deficits were improving, but were not resolved.  Apparently, the patient was seen by teleneurology.  He was offered TPA, but he refused.  He denies any fevers, chills, chest pain, shortness breath, coughing, hemoptysis, nausea, vomiting, diarrhea, abdominal pain.  He denies any headache.  He did have some blurry vision but this is stable. MRI shows: Small acute infarct of the lateral right thalamus, along the posterior limb of the right internal capsule. No hemorrhage or mass effect. SLE requested. Pt passed [place] swallow screen in the ED.   Assessment / Plan / Recommendation Clinical Impression  Pt presents with mild dysarthria characterized by occasional imprecise articulation, however Pt and spouse indicate that this has improved since yesterday. Pt's speech was judged to be 100% intelligible. He has mild left facial asymmetry otherwise oral motor examination was WNL. Pt reports pre-existing mild working memory impairment and demonstrated this in the evaluation (immediate memory 4/4, delayed 1/4, and 4/4 with multiple choice cues). 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 He drives and cooks at home. His wife handles bill paying. SLP encouraged Pt to utilize compensatory memory strategies at home by using a medication box (he does this already), using a calendar, and writing down important information. Pt and wife indicate that Pt's speech is still slightly "different", but report other cognitive  linguistic skills to be at baseline. No further SLP needs identified at this time, SLP will sign off. Pt will notify his PCP ([place]), should he note any cognitive linguistic changes once he returns home.    SLP Assessment  SLP Recommendation/Assessment: Patient does not need any further Speech Lanaguage Pathology Services SLP Visit Diagnosis: Dysarthria and anarthria (R47.1)    Follow Up Recommendations  None    Frequency and Duration           SLP Evaluation Cognition  Overall Cognitive Status: History of cognitive impairments - at baseline Arousal/Alertness: Awake/alert Orientation Level: Oriented X4 Attention: Sustained Sustained Attention: Impaired Sustained Attention Impairment: Verbal complex(unable to complete months in reverse) Memory: Impaired(recalled 1/4 independently, 4/4 with cue) Memory Impairment: Retrieval deficit Awareness: Appears intact Problem Solving: Appears intact Safety/Judgment: Appears intact       Comprehension  Auditory Comprehension Overall Auditory Comprehension: Appears within functional limits for tasks assessed Yes/No Questions: Within Functional Limits Commands: Within Functional Limits Conversation: Complex Interfering Components: Working Field seismologist: Repetition Retail banker: Within Function Limits Reading Comprehension Reading Status: Within funtional limits    Expression Expression Primary Mode of Expression: Verbal Verbal Expression Overall Verbal Expression: Appears within functional limits for tasks assessed Initiation: No impairment Automatic Speech: Name;Social Response;Month of year Level of Generative/Spontaneous Verbalization: Conversation Repetition: No impairment Naming: No impairment Pragmatics: No impairment Non-Verbal Means of Communication: Not applicable Written Expression Dominant Hand: Right Written Expression: Not tested  Oral / Motor  Oral Motor/Sensory  Function Overall Oral Motor/Sensory Function: Mild impairment Facial ROM: Reduced left;Suspected CN VII (facial) dysfunction Facial Symmetry: Abnormal symmetry left(mild) Facial Strength: Within Functional Limits Facial Sensation: Within Functional Limits Lingual ROM: Within Functional Limits Lingual Symmetry: Within Functional Limits Lingual Strength: Within Functional Limits Lingual Sensation: Within Functional Limits Velum: Within Functional Limits Mandible: Within Functional Limits Motor Speech Overall Motor Speech: Impaired Respiration: Within functional limits Phonation: Normal Resonance: Within functional limits Articulation: Impaired Level of Impairment: Sentence Intelligibility: Intelligible Motor Planning: Witnin functional limits Motor Speech Errors: Aware Effective Techniques: Slow rate;Over-articulate   Thank you,  Genene Churn, [place]                     [place] 04/16/2020, 4:00 PM

## 2020-04-16 NOTE — Consult Note (Signed)
[place] A. Merlene Laughter, MD     [email or web address]          David Dougherty is an 69 y.o. male.   ASSESSMENT/PLAN: 1.  Acute weakness involving the left side: This is a lacunar infarct but the patient does have chronic atrial fibrillation and is at increased risk of recurrent events.  Xarelto or other form of anticoagulation medications is recommended.  This is discussed with the patient and his family at bedside.  Once anticoagulation has been initiated, aspirin can be discontinued.  Other risk factor reduction includes discontinuation of nicotine products, blood pressure control and the use of statin medication.    The patient is a 69 year old right-handed white male who presents with acute onset of left-sided hemiparesis especially involving left upper extremity.  He also had dysarthria.  He presented to the hospital for further evaluation.  His symptoms have improved but he still has significant symptoms especially involving the left upper extremity.  He does not report having numbness or tingling.  There is no reports of dizziness, diplopia or dysphagia.  No chest pain or palpitation.  The review of systems is otherwise negative.  The patient has been on aspirin and has been compliant with this.  He previously refused anticoagulation.  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    GENERAL: This a pleasant thin male who is in no acute distress.  HEENT: Neck is supple no trauma appreciated.  ABDOMEN: soft  EXTREMITIES: No edema   BACK: Normal  SKIN: Normal by inspection.    MENTAL STATUS: Alert and oriented -including orientation to his age and the month. Speech, language and cognition are generally intact. Judgment and insight normal.   CRANIAL NERVES: Pupils are equal, round and reactive to light and accomodation; extra ocular movements are full, there is no significant nystagmus; visual fields are full; there is mild  flattening of the nasolabial fold -on the left side otherwise upper and lower facial muscles are symmetric and normal; tongue is midline; uvula is midline; shoulder elevation is normal.  MOTOR: Direct bedside testing is normal.  Bulk and tone are also normal.  There is slight limp.  Finger tapping and hand dexterity involving the left upper extremity.  This is associated with mild left upper extremity drift.  The left leg is normal without drift noted.  COORDINATION: Left finger to nose is normal, right finger to nose is normal, No rest tremor; no intention tremor; no postural tremor; no bradykinesia.  REFLEXES: Deep tendon reflexes are symmetrical and normal.   SENSATION: Normal to light touch, temperature, and pain.  GAIT: Normal.   NIH stroke scale 2.   Blood pressure 110/64, pulse 88, temperature 98.1 F (36.7 C), temperature source Oral, resp. rate 12, height '6\' 1"'  (1.854 m), weight 104.4 kg, SpO2 94 %.  Past Medical History:  Diagnosis Date  . Atrial fibrillation ([place])   . CAD (coronary artery disease)    70% distal circumflex 11/2014   . Dysrhythmia    AFib  . GERD (gastroesophageal reflux disease)   . Nonischemic cardiomyopathy ([place])   . Tobacco abuse    Snuff    Past Surgical History:  Procedure Laterality Date  . APPENDECTOMY    . CATARACT EXTRACTION W/PHACO Left 11/18/2019   Procedure: CATARACT EXTRACTION PHACO AND INTRAOCULAR LENS PLACEMENT (IOC);  Surgeon: [name], MD;  Location: [facility];  Service: Ophthalmology;  Laterality: Left;  CDE: 9.66  . CATARACT EXTRACTION W/PHACO  Right 12/04/2019   Procedure: CATARACT EXTRACTION PHACO AND INTRAOCULAR LENS PLACEMENT RIGHT EYE (CDE: 4.56);  Surgeon: [name], MD;  Location: [facility];  Service: Ophthalmology;  Laterality: Right;  . COLONOSCOPY  2005   Neg  . COLONOSCOPY N/A 01/05/2015   SLF:17 colon polyps removed/moderate sized hemorrhoids  . COLONOSCOPY WITH PROPOFOL N/A 06/19/2018   Procedure: COLONOSCOPY WITH  PROPOFOL;  Surgeon: Danie Binder, MD;  Location[facility];  Service: Endoscopy;  Laterality: N/A;  7:30am  . 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 ESOPHAGOGASTRODUODENOSCOPY (EGD) WITH PROPOFOL N/A 06/19/2018   Procedure: ESOPHAGOGASTRODUODENOSCOPY (EGD) WITH PROPOFOL;  Surgeon: Danie Binder, MD;  [facility];  Service: Endoscopy;  Laterality: N/A;  . HEMORRHOID BANDING N/A 01/05/2015   Procedure: HEMORRHOID BANDING;  Surgeon: Danie Binder, MD;  [facility];  Service: Endoscopy;  Laterality: N/A;  . LEFT AND RIGHT HEART CATHETERIZATION WITH CORONARY ANGIOGRAM N/A 12/03/2014   Procedure: LEFT AND RIGHT HEART CATHETERIZATION WITH CORONARY ANGIOGRAM;  Surgeon: Blane Ohara, MD;  Location: [facility] CATH LAB;  Service: Cardiovascular;  Laterality: N/A;  . POLYPECTOMY  06/19/2018   Procedure: POLYPECTOMY;  Surgeon: Danie Binder, MD;  [facility];  Service: Endoscopy;;  rectal   . SAVORY DILATION  06/19/2018   Procedure: SAVORY DILATION;  Surgeon: Danie Binder, MD;  [facility];  Service: Endoscopy;;  . TONSILLECTOMY      Family History  Problem Relation Age of Onset  . Heart attack Father   . Hypertension Father   . Diabetes Father   . Colon cancer Neg Hx   . Colon polyps Neg Hx     Social History:  reports that he quit smoking about 47 years ago. His smoking use included cigars and cigarettes. He started smoking about 50 years ago. He has a 0.75 pack-year smoking history. He has quit using smokeless tobacco.  His smokeless tobacco use included snuff. He reports that he does not drink alcohol or use drugs.  Allergies:  Allergies  Allergen Reactions  . Reglan [Metoclopramide] Anaphylaxis    Medications: Prior to Admission medications   Medication Sig Start Date End Date Taking? Authorizing Provider  acetaminophen  (TYLENOL) 500 MG tablet Take 1,000 mg by mouth every 6 (six) hours as needed for mild pain or headache.    Yes [provider]  aspirin EC 81 MG tablet Take 81 mg by mouth daily.   Yes [provider]  calcium carbonate (TUMS - DOSED IN MG ELEMENTAL CALCIUM) 500 MG chewable tablet Chew 2-3 tablets by mouth as needed for indigestion or heartburn.    Yes [provider]  carvedilol (COREG) 3.125 MG tablet Take 1 tablet (3.125 mg total) by mouth 2 (two) times daily. 11/21/19  Yes Luking, Elayne Snare, MD  Cholecalciferol (VITAMIN D) 125 MCG (5000 UT) CAPS Take 15,000 Units by mouth daily.   Yes [provider]  furosemide (LASIX) 20 MG tablet Take 1 tablet (20 mg total) by mouth daily. 11/21/19  Yes Kathyrn Drown, MD  losartan (COZAAR) 25 MG tablet Take 1 tablet (25 mg total) by mouth daily. 11/21/19  Yes Luking, Elayne Snare, MD  nitroGLYCERIN (NITROSTAT) 0.4 MG SL tablet PLACE 1 TAB UNDER TONGUE EVERY 5 MIN IF NEEDED FOR CHEST PAIN. MAY USE 3 TIMES.NO RELIEF CALL 911. Patient taking differently: Place 0.4 mg under the tongue every 5 (five)  minutes as needed for chest pain.  02/14/17  Yes Satira Sark, MD  omeprazole (PRILOSEC) 20 MG capsule TAKE 1 CAPSULE BY MOUTH ONCE DAILY 30  MINUTES  PRIOR  TO  BREAKFAST 11/21/19  Yes Luking, Elayne Snare, MD  oxymetazoline (AFRIN) 0.05 % nasal spray Place 1 spray into both nostrils 2 (two) times daily as needed for congestion.   Yes [provider]  Phenyleph-Doxylamine-DM-APAP (NYQUIL SEVERE COLD/FLU) 5-6.25-10-325 MG/15ML LIQD Take 15 mLs by mouth at bedtime as needed (congestion).   Yes [provider]  Phenylephrine-DM-GG-APAP (VICKS DAYQUIL SEVERE COLD/FLU) 5-10-200-325 MG/15ML LIQD Take 15 mLs by mouth 2 (two) times daily as needed (congestion).   Yes [provider]  topiramate (TOPAMAX) 50 MG tablet Take 1 tablet (50 mg total) by mouth 2 (two) times daily. 11/21/19  Yes Kathyrn Drown, MD     Scheduled Meds: .  stroke: mapping our early stages of recovery book   Does not apply Once  . aspirin EC  325 mg Oral Daily  . cholecalciferol  15,000 Units Oral Daily  . enoxaparin (LOVENOX) injection  40 mg Subcutaneous Q24H  . furosemide  20 mg Oral Daily  . pantoprazole  40 mg Oral Daily  . topiramate  50 mg Oral BID   Continuous Infusions: . sodium chloride     PRN Meds:.acetaminophen **OR** acetaminophen (TYLENOL) oral liquid 160 mg/5 mL **OR** acetaminophen, calcium carbonate, hydrALAZINE, senna-docusate     Results for orders placed or performed during the hospital encounter of 04/15/20 (from the past 48 hour(s))  Ethanol     Status: None   Collection Time: 04/15/20  1:51 PM  Result Value Ref Range   Alcohol, Ethyl (B) <10 <10 mg/dL    Comment: (NOTE) Lowest detectable limit for serum alcohol is 10 mg/dL. For medical purposes only. Performed at [facility], [address], [place], [place] [address]   Protime-INR     Status: None   Collection Time: 04/15/20  1:51 PM  Result Value Ref Range   Prothrombin Time 12.8 11.4 - 15.2 seconds   INR 1.0 0.8 - 1.2    Comment: (NOTE) INR goal varies based on device and disease states. Performed at [facility], [address] [place], [place] [address]   APTT     Status: None   Collection Time: 04/15/20  1:51 PM  Result Value Ref Range   aPTT 30 24 - 36 seconds    Comment: Performed at [facility], [address], [place], [place] [address]  CBC     Status: None   Collection Time: 04/15/20  1:51 PM  Result Value Ref Range   WBC 5.0 4.0 - 10.5 K/uL   RBC 5.04 4.22 - 5.81 MIL/uL   Hemoglobin 14.9 13.0 - 17.0 g/dL   HCT 46.3 39.0 - 52.0 %   MCV 91.9 80.0 - 100.0 fL   MCH 29.6 26.0 - 34.0 pg   MCHC 32.2 30.0 - 36.0 g/dL   RDW 12.8 11.5 - 15.5 %   Platelets 202 150 - 400 K/uL   nRBC 0.0 0.0 - 0.2 %    Comment: Performed at [facility], [address], [place], [place] [address]  Differential     Status: None    Collection Time: 04/15/20  1:51 PM  Result Value Ref Range   Neutrophils Relative % 57 %   Neutro Abs 2.8 1.7 - 7.7 K/uL   Lymphocytes Relative 32 %   Lymphs Abs 1.6 0.7 -  4.0 K/uL   Monocytes Relative 7 %   Monocytes Absolute 0.4 0.1 - 1.0 K/uL   Eosinophils Relative 4 %   Eosinophils Absolute 0.2 0.0 - 0.5 K/uL   Basophils Relative 0 %   Basophils Absolute 0.0 0.0 - 0.1 K/uL   Immature Granulocytes 0 %   Abs Immature Granulocytes 0.01 0.00 - 0.07 K/uL    Comment: Performed at [facility], [address] [place], [place] [address]  Comprehensive metabolic panel     Status: Abnormal   Collection Time: 04/15/20  1:51 PM  Result Value Ref Range   Sodium 139 135 - 145 mmol/L   Potassium 3.7 3.5 - 5.1 mmol/L   Chloride 108 98 - 111 mmol/L   CO2 23 22 - 32 mmol/L   Glucose, Bld 95 70 - 99 mg/dL    Comment: Glucose reference range applies only to samples taken after fasting for at least 8 hours.   BUN 16 8 - 23 mg/dL   Creatinine, Ser 1.06 0.61 - 1.24 mg/dL   Calcium 8.8 (L) 8.9 - 10.3 mg/dL   Total Protein 7.5 6.5 - 8.1 g/dL   Albumin 4.3 3.5 - 5.0 g/dL   AST 20 15 - 41 U/L   ALT 20 0 - 44 U/L   Alkaline Phosphatase 72 38 - 126 U/L   Total Bilirubin 0.7 0.3 - 1.2 mg/dL   GFR calc non Af Amer >60 >60 mL/min   GFR calc Af Amer >60 >60 mL/min   Anion gap 8 5 - 15    Comment: Performed at [facility], [address] [place], [place] [address]  Urine rapid drug screen (hosp performed)     Status: None   Collection Time: 04/15/20  1:54 PM  Result Value Ref Range   Opiates NONE DETECTED NONE DETECTED   Cocaine NONE DETECTED NONE DETECTED   Benzodiazepines NONE DETECTED NONE DETECTED   Amphetamines NONE DETECTED NONE DETECTED   Tetrahydrocannabinol NONE DETECTED NONE DETECTED   Barbiturates NONE DETECTED NONE DETECTED    Comment: (NOTE) DRUG SCREEN FOR MEDICAL PURPOSES ONLY.  IF CONFIRMATION IS NEEDED FOR ANY PURPOSE, NOTIFY LAB WITHIN 5 DAYS. LOWEST DETECTABLE LIMITS FOR  URINE DRUG SCREEN Drug Class                     Cutoff (ng/mL) Amphetamine and metabolites    1000 Barbiturate and metabolites    200 Benzodiazepine                 643 Tricyclics and metabolites     300 Opiates and metabolites        300 Cocaine and metabolites        300 THC                            50 Performed at [facility], [address] [place], [place] [address]   Urinalysis, Routine w reflex microscopic     Status: Abnormal   Collection Time: 04/15/20  1:54 PM  Result Value Ref Range   Color, Urine COLORLESS (A) YELLOW   APPearance CLEAR CLEAR   Specific Gravity, Urine 1.019 1.005 - 1.030   pH 7.0 5.0 - 8.0   Glucose, UA NEGATIVE NEGATIVE mg/dL   Hgb urine dipstick NEGATIVE NEGATIVE   Bilirubin Urine NEGATIVE NEGATIVE   Ketones, ur NEGATIVE NEGATIVE mg/dL   Protein, ur NEGATIVE NEGATIVE mg/dL   Nitrite NEGATIVE NEGATIVE  Leukocytes,Ua NEGATIVE NEGATIVE    Comment: Performed at [facility], [address] [place], [place] [address]  Ginger Carne 8, ED     Status: None   Collection Time: 04/15/20  1:58 PM  Result Value Ref Range   Sodium 142 135 - 145 mmol/L   Potassium 3.8 3.5 - 5.1 mmol/L   Chloride 109 98 - 111 mmol/L   BUN 15 8 - 23 mg/dL   Creatinine, Ser 1.00 0.61 - 1.24 mg/dL   Glucose, Bld 89 70 - 99 mg/dL    Comment: Glucose reference range applies only to samples taken after fasting for at least 8 hours.   Calcium, Ion 1.22 1.15 - 1.40 mmol/L   TCO2 22 22 - 32 mmol/L   Hemoglobin 16.0 13.0 - 17.0 g/dL   HCT 47.0 39.0 - 52.0 %  CBG monitoring, ED     Status: Abnormal   Collection Time: 04/15/20  2:05 PM  Result Value Ref Range   Glucose-Capillary 112 (H) 70 - 99 mg/dL    Comment: Glucose reference range applies only to samples taken after fasting for at least 8 hours.  SARS Coronavirus 2 by RT PCR (hospital order, performed in [facility] hospital lab) Nasopharyngeal Nasopharyngeal Swab     Status: None   Collection Time: 04/15/20  2:44 PM    Specimen: Nasopharyngeal Swab  Result Value Ref Range   SARS Coronavirus 2 NEGATIVE NEGATIVE    Comment: (NOTE) SARS-CoV-2 target nucleic acids are NOT DETECTED. The SARS-CoV-2 RNA is generally detectable in upper and lower respiratory specimens during the acute phase of infection. The lowest concentration of SARS-CoV-2 viral copies this assay can detect is 250 copies / mL. A negative result does not preclude SARS-CoV-2 infection and should not be used as the sole basis for treatment or other patient management decisions.  A negative result may occur with improper specimen collection / handling, submission of specimen other than nasopharyngeal swab, presence of viral mutation(s) within the areas targeted by this assay, and inadequate number of viral copies (<250 copies / mL). A negative result must be combined with clinical observations, patient history, and epidemiological information. Fact Sheet for Patients:   StrictlyIdeas.no Fact Sheet for Healthcare Providers: BankingDealers.co.za This test is not yet approved or cleared  by the [place] FDA and has been authorized for detection and/or diagnosis of SARS-CoV-2 by FDA under an Emergency Use Authorization (EUA).  This EUA will remain in effect (meaning this test can be used) for the duration of the COVID-19 declaration under Section 564(b)(1) of the Act, 21 U.S.C. section 360bbb-3(b)(1), unless the authorization is terminated or revoked sooner. Performed at [facility], [address] [place], [place] [address]   Lipid panel     Status: Abnormal   Collection Time: 04/16/20  5:16 AM  Result Value Ref Range   Cholesterol 163 0 - 200 mg/dL   Triglycerides 157 (H) <150 mg/dL   HDL 34 (L) >40 mg/dL   Total CHOL/HDL Ratio 4.8 RATIO   VLDL 31 0 - 40 mg/dL   LDL Cholesterol 98 0 - 99 mg/dL    Comment:        Total Cholesterol/HDL:CHD Risk Coronary Heart Disease Risk Table                      Men   Women  1/2 Average Risk   3.4   3.3  Average Risk       5.0   4.4  2 X Average Risk   9.6   7.1  3 X Average Risk  23.4   11.0        Use the calculated Patient Ratio above and the CHD Risk Table to determine the patient's CHD Risk.        ATP III CLASSIFICATION (LDL):  <100     mg/dL   Optimal  100-129  mg/dL   Near or Above                    Optimal  130-159  mg/dL   Borderline  160-189  mg/dL   High  >190     mg/dL   Very High Performed at [facility], [address], [place], [place] [address]     Studies/Results  HEAD NECK CTA FINDINGS: CT HEAD FINDINGS  Brain: No acute intracranial hemorrhage, mass effect, or edema. No new loss of gray-white differentiation since the recent prior study.  Vascular: No new findings.  Skull: No new findings.  Sinuses/Orbits: No new findings.  Other: None.  ASPECTS ([place] Stroke Program Early CT Score)  - Ganglionic level infarction (caudate, lentiform nuclei, internal capsule, insula, M1-M3 cortex): 7  - Supraganglionic infarction (M4-M6 cortex): 3  Total score (0-10 with 10 being normal): 10  Review of the MIP images confirms the above findings  CTA NECK FINDINGS  Aortic arch: Great vessel origins are patent.  Right carotid system: Patent. There is no measurable stenosis at the ICA origin.  Left carotid system: Patent. There is no measurable stenosis at the ICA origin.  Vertebral arteries: Patent and nearly codominant. Plaque at the left vertebral origin causes mild to moderate stenosis.  Skeleton: Cervical spine degenerative changes without high-grade osseous encroachment on the spinal canal.  Other neck: No mass or adenopathy.  Upper chest: No apical lung mass.  Review of the MIP images confirms the above findings  CTA HEAD FINDINGS  Anterior circulation: Intracranial internal carotids are patent with calcified plaque causing mild stenosis. Anterior and middle  cerebral arteries are patent.  Posterior circulation: Intracranial vertebral arteries, basilar artery, and posterior cerebral arteries are patent. There are patent bilateral posterior communicating arteries.  Venous sinuses: As permitted by contrast timing, patent.  Review of the MIP images confirms the above findings  CT Brain Perfusion Findings:  CBF (<30%) Volume: 37m  Perfusion (Tmax>6.0s) volume: 0238m Mismatch Volume: 38m41mInfarction Location:None  IMPRESSION: No acute intracranial hemorrhage or evidence of acute infarction. ASPECT score is 10. There is no large vessel occlusion or hemodynamically significant stenosis. Perfusion imaging demonstrates no evidence of core infarction or penumbra.     BRAIN MRI MRA EXAM: MRI HEAD WITHOUT CONTRAST  MRA HEAD WITHOUT CONTRAST  TECHNIQUE: Multiplanar, multiecho pulse sequences of the brain and surrounding structures were obtained without intravenous contrast. Angiographic images of the head were obtained using MRA technique without contrast.  COMPARISON:  CTA head neck 04/15/2020  FINDINGS: MRI HEAD FINDINGS  Brain: Small area of abnormal diffusion restriction within the lateral right thalamus, along the posterior limb of the right internal capsule. No other diffusion abnormality. No acute hemorrhage. No midline shift or other mass effect. Normal white matter signal. Normal volume of CSF spaces. No chronic microhemorrhage. Normal midline structures.  Vascular: Normal flow voids.  Skull and upper cervical spine: Normal marrow signal.  Sinuses/Orbits: Negative.  Other: None.  MRA HEAD FINDINGS  POSTERIOR CIRCULATION:  --Vertebral arteries: Limited flow related enhancement of the left V4 segment.  --Inferior cerebellar  arteries: Normal.  --Basilar artery: Normal.  --Superior cerebellar arteries: Normal.  --Posterior cerebral arteries: Normal. There are bilateral  posterior communicating arteries (p-comm) that partially supply the PCAs.  ANTERIOR CIRCULATION:  --Intracranial internal carotid arteries: Normal.  --Anterior cerebral arteries (ACA): Normal. Both A1 segments are present. Patent anterior communicating artery (a-comm).  --Middle cerebral arteries (MCA): Normal.  IMPRESSION: 1. Small acute infarct of the lateral right thalamus, along the posterior limb of the right internal capsule. No hemorrhage or mass effect. 2. Diminished flow-related enhancement of the left vertebral artery V4 segment, likely indicating stenosis. Otherwise normal intracranial MRA.     The MRI scan is reviewed impression and shows an acute infarct involving the posterior limb of the internal capsule and adjacent basal ganglia on the right side.  There is mild chronic deep white matter signal seen on FLAIR imaging.  No evidence of prior infarcts or hemorrhage are noted.    Otis Burress A. Merlene Laughter, M.D.  Diplomate, Tax adviser of Psychiatry and Neurology ( Neurology). 04/16/2020, 7:26 AM

## 2020-04-17 ENCOUNTER — Other Ambulatory Visit: Payer: Self-pay | Admitting: *Deleted

## 2020-04-17 ENCOUNTER — Telehealth: Payer: Self-pay | Admitting: Family Medicine

## 2020-04-17 LAB — CBC
HCT: 46.5 % (ref 39.0–52.0)
Hemoglobin: 14.7 g/dL (ref 13.0–17.0)
MCH: 29 pg (ref 26.0–34.0)
MCHC: 31.6 g/dL (ref 30.0–36.0)
MCV: 91.7 fL (ref 80.0–100.0)
Platelets: 192 10*3/uL (ref 150–400)
RBC: 5.07 MIL/uL (ref 4.22–5.81)
RDW: 12.5 % (ref 11.5–15.5)
WBC: 5.8 10*3/uL (ref 4.0–10.5)
nRBC: 0 % (ref 0.0–0.2)

## 2020-04-17 LAB — BASIC METABOLIC PANEL
Anion gap: 9 (ref 5–15)
BUN: 17 mg/dL (ref 8–23)
CO2: 24 mmol/L (ref 22–32)
Calcium: 9.2 mg/dL (ref 8.9–10.3)
Chloride: 106 mmol/L (ref 98–111)
Creatinine, Ser: 1.06 mg/dL (ref 0.61–1.24)
GFR calc Af Amer: >60 mL/min (ref >60)
GFR calc non Af Amer: >60 mL/min (ref >60)
Glucose, Bld: 125 mg/dL — ABNORMAL HIGH (ref 70–99)
Potassium: 3.6 mmol/L (ref 3.5–5.1)
Sodium: 139 mmol/L (ref 135–145)

## 2020-04-17 LAB — MAGNESIUM: Magnesium: 2.1 mg/dL (ref 1.7–2.4)

## 2020-04-17 MED ORDER — WARFARIN SODIUM 5 MG PO TABS
5.0000 mg | ORAL_TABLET | Freq: Every day | ORAL | 0 refills | Status: DC
Start: 2020-04-17 — End: 2020-05-07

## 2020-04-17 MED ORDER — ATORVASTATIN CALCIUM 40 MG PO TABS: 40.0000 mg | ORAL_TABLET | Freq: Every day | ORAL | 0 refills | Status: DC

## 2020-04-17 MED ORDER — ATORVASTATIN CALCIUM 40 MG PO TABS
40.0000 mg | ORAL_TABLET | Freq: Every day | ORAL | Status: DC
  Administered 2020-04-17: 40 mg via ORAL
  Filled 2020-04-17: qty 1

## 2020-04-17 MED ORDER — RIVAROXABAN (XARELTO) VTE STARTER PACK (15 & 20 MG): ORAL_TABLET | ORAL | 0 refills | Status: DC

## 2020-04-17 NOTE — Discharge Instructions (Signed)
Cognitive Rehabilitation After a Stroke After a stroke, you may have various problems with thinking (cognitive disability). The types of problems you have will depend on how severe the stroke was and where it was located in the brain. Problems may include:  Problems with short-term memory.  Trouble paying attention.  Trouble communicating or understanding language (aphasia).  A drop in mental ability that may interfere with daily life (dementia).  Trouble with problem-solving and information processing.  Problems with reading, writing, or math.  Problems with your ability to plan and to perform activities in sequence (executive function). These problems can feel overwhelming. However, with rehabilitation and time to heal, many people have improvement in their symptoms. What causes cognitive disability? A stroke happens when blood cannot flow to certain areas of the brain. When this happens, brain cells die in the affected areas because they cannot get oxygen and nutrients from the blood. Cognitive disability is caused by the death of cells in the areas of the brain that control thinking. What is cognitive rehabilitation? Cognitive rehabilitation is a program to help you improve your thinking skills after a stroke. Rehabilitation cannot completely reverse the effects of a stroke, but it can help you with memory, problem-solving, and communication skills. Therapy focuses on:  Improving brain function. This may involve activities such as learning to break down tasks into simple steps.  Helping you learn ways to cope with thinking problems. For example, you might learn memory tricks or do activities that stimulate memory, such as naming objects or describing pictures. Cognitive rehabilitation may include:  Speech-language therapy to help you understand and use language to communicate.  Occupational therapy to help you perform daily activities.  Music therapy to help relieve stress,  anxiety, and depression. This may involve listening to music, singing, or playing instruments.  Physical therapy to help improve your ability to move and perform actions that involve the muscles (motor functions). When will therapy start and where will I have therapy? Your health care provider will decide when it is best for you to start therapy. In some cases, people start rehabilitation as soon as their health is stable, which may be 24-48 hours after the stroke. Rehabilitation can take place in a few different places, based on your needs. It may take place in:  The hospital or an in-patient rehabilitation hospital.  An outpatient rehabilitation facility.  A long-term care facility.  A community rehabilitation clinic.  Your home. What are some tools to help after a stroke? There are a number of tools and apps that you can use on your smartphone, personal computer, or tablet to help improve brain function. Some of these apps include:  Calendar reminders or alarm apps to help with memory.  Note-taking or sketch pad apps to help with memory or communication.  Text-to-speech apps that allow you to listen to what you are reading, which helps your ability to understanding text.  Picture dictionary or picture message apps to help with communication.  E-readers. These can highlight text as it is read aloud, which helps with listening and reading skills. How can my friends or family help during my rehabilitation? During your recovery, it is important that your friends and family members help you work toward more independence. Your caregivers should speak with your health care providers to learn how they can best help you during recovery. This may include working on speech-language or memory exercises at home, or helping with daily tasks and errands. If you have cognitive disability, you may   be at risk for injury or accidents at home, such as forgetting to turn off the stove. Friends and family  members can help ensure home safety by taking steps such as getting appliances with automatic shut-off features or storing dangerous objects in a secure place. What else should I know about cognitive rehabilitation after a stroke? Having trouble with memory and problem-solving can make you feel alone. You may also have mood changes, anxiety, or depression after a stroke. It is important to:  Stay connected with others through social groups, online support groups, or your community.  Talk to your friends, family, and caregivers about any emotional problems you are having.  Go to one-on-one or group therapy as suggested by your health care provider.  Stay physically active and exercise as often as suggested by your health care provider. Summary  After a stroke, some people have problems with thinking that affect attention, memory, language, communication, and problem-solving.  Cognitive rehabilitation is a program to help you regain brain function and learn skills to cope with thinking problems.  Rehabilitation cannot completely reverse the effects of a stroke, but it can help to improve quality of life.  Cognitive rehabilitation may include speech-language therapy, occupational therapy, music therapy, and physical therapy. This information is not intended to replace advice given to you by your health care provider. Make sure you discuss any questions you have with your health care provider. Document Revised: 03/13/2019 Document Reviewed: 02/24/2017 Elsevier Patient Education  2020 [employer]   Hospital Discharge After a Stroke  Being discharged from the hospital after a stroke can feel overwhelming. Many things may be different, and it is normal to feel scared or anxious. Some stroke survivors may be able to return to their homes, and others may need more specialized care on a temporary or permanent basis. Your stroke care team will work with you to develop a discharge plan that is best  for you. Ask questions if you do not understand something. Invite a friend or family member to participate in discharge planning. Understanding and following your discharge plan can help to prevent another stroke or other problems. Understanding your medicines After a stroke, your health care provider may prescribe one or more types of medicine. It is important to take medicines exactly as told by your health care provider. Serious harm, such as another stroke, can happen if you are unable to take your medicine exactly as prescribed. Make sure you understand:  What medicine to take.  Why you are taking the medicine.  How and when to take it.  If it can be taken with your other medicines and herbal supplements.  Possible side effects.  When to call your health care provider if you have any side effects.  How you will get and pay for your medicines. Medical assistance programs may be able to help you pay for prescription medicines if you cannot afford them. If you are taking an anticoagulant, be sure to take it exactly as told by your health care provider. This type of medicine can increase the risk of bleeding because it works to prevent blood from clotting. You may need to take certain precautions to prevent bleeding. You should contact your health care provider if you have:  Bleeding or bruising.  A fall or other injury to your head.  Blood in your urine or stool (feces). Planning for home safety  Take steps to prevent falls, such as installing grab bars or using a shower chair. Ask a friend   or family member to get needed things in place before you go home if possible. A therapist can come to your home to make recommendations for safety equipment. Ask your health care provider if you would benefit from this service or from home care. Getting needed equipment Ask your health care provider for a list of any medical equipment and supplies you will need at home. These may include items such  as:  Walkers.  Canes.  Wheelchairs.  Hand-strengthening devices.  Special eating utensils. Medical equipment can be rented or purchased, depending on your insurance coverage. Check with your insurance company about what is covered. Keeping follow-up visits After a stroke, you will need to follow up regularly with a health care provider. You may also need rehabilitation, which can include physical therapy, occupational therapy, or speech-language therapy. Keeping these appointments is very important to your recovery after a stroke. Be sure to bring your medicine list and discharge papers with you to your appointments. If you need help to keep track of your schedule, use a calendar or appointment reminder. Preventing another stroke Having a stroke puts you at risk for another stroke in the future. Ask your health care provider what actions you can take to lower the risk. These may include:  Increasing how much you exercise.  Making a healthy eating plan.  Quitting smoking.  Managing other health conditions, such as high blood pressure, high cholesterol, or diabetes.  Limiting alcohol use. Knowing the warning signs of a stroke  Make sure you understand the signs of a stroke. Before you leave the hospital, you will receive information outlining the stroke warning signs. Share these with your friends and family members. "BE FAST" is an easy way to remember the main warning signs of a stroke:  B - Balance. Signs are dizziness, sudden trouble walking, or loss of balance.  E - Eyes. Signs are trouble seeing or a sudden change in vision.  F - Face. Signs are sudden weakness or numbness of the face, or the face or eyelid drooping on one side.  A - Arms. Signs are weakness or numbness in an arm. This happens suddenly and usually on one side of the body.  S - Speech. Signs are sudden trouble speaking, slurred speech, or trouble understanding what people say.  T - Time. Time to call  emergency services. Write down what time symptoms started. Other signs of stroke may include:  A sudden, severe headache with no known cause.  Nausea or vomiting.  Seizure. These symptoms may represent a serious problem that is an emergency. Do not wait to see if the symptoms will go away. Get medical help right away. Call your local emergency services (911 in the [place] Do not drive yourself to the hospital. Make note of the time that you had your first symptoms. Your emergency responders or emergency room staff will need to know this information. Summary  Being discharged from the hospital after a stroke can feel overwhelming. It is normal to feel scared or anxious.  Make sure you take medicines exactly as told by your health care provider.  Know the warning signs of a stroke, and get help right way if you have any of these symptoms. "BE FAST" is an easy way to remember the main warning signs of a stroke. This information is not intended to replace advice given to you by your health care provider. Make sure you discuss any questions you have with your health care provider. Document Revised: 08/14/2019 Document   Reviewed: 02/24/2017 Elsevier Patient Education  2020 [employer]   Rehabilitation After a Stroke, Adult A stroke causes damage to the brain cells, which can affect your ability to walk, talk, or remember things. The impact of a stroke is different for everyone, and so is recovery. Some people have progress during the first few days after treatment. Others may take weeks or longer to make progress. Stroke rehabilitation includes a variety of treatments to help you recover and promote your independence after a stroke. You may not be able do everything that you did before the stroke, but you can learn ways to manage your lifestyle and be as independent as possible. Rehabilitation will start as soon as you are able to participate after your stroke, and it involves care from a team that  may include:  Family and friends. Your loved ones know you best and can be very helpful in your recovery.  Physicians.  Nurses.  Physical and occupational therapists.  Speech-language therapists.  A nutritionist.  A psychologist.  A social worker. Keep open communication with all members of your care team. Share your medical records if needed, and take notes about each provider's recommendations. What is physical therapy? Physical therapists (PTs) help you to improve your coordination, balance, and muscle strength. Physical therapy may involve:  Range of motion exercises.  Help to move between lying, sitting, and standing positions.  Walking with a cane or walker, if needed.  Help using stairs. What is occupational therapy? Occupational therapists (OTs) help you rebuild your ability to do everyday tasks, such as brushing your teeth, going to the bathroom, eating, and getting dressed. Occupational therapy may also help with:  Vision. Visual scanning is a technique that is used to prevent falls.  Memory and cognitive training. This therapy includes problem-solving techniques and relearning tasks like making a phone call.  Fine muscle movements such as buttoning a shirt or picking up small objects. What is speech therapy? Speech-language therapists help you communicate. After a stroke, you may have problems understanding what people are saying, or you may have trouble writing, speaking, or finding the right word for what you want to say. You may also need speech therapy if you have difficulty swallowing while eating and drinking. Examples of speech-language therapies include:  Techniques to strengthen muscles used in swallowing.  Naming objects or describing pictures. This helps retrain the brain to recognize and remember words.  Exercises to strengthen the muscles involved in talking, including your tongue and lips.  Exercises to retrain your brain in understanding what you  read and hear. How often will I need therapy? Therapy will begin as soon as you are able to participate, which is often within the first few days after a stroke. Sessions will be frequent at first. For example, you may have therapy 2-3 hours a day on most days of the week during the first few months. The intensity depends on the type and severity of your stroke. You may need therapy for several months. Therapy may take place in the hospital, at a rehabilitation center, or in your home. Are there any side effects of therapy? Therapy is safe and is usually well-tolerated. You may feel physically and mentally tired after therapy, especially during the first few weeks. Rest before therapy sessions if you need to so you can get the most out of your rehabilitation. Follow these instructions at home:  Involve your family and friends in your recovery, if possible. Having another person to encourage you is   beneficial.  Follow instructions from your speech-language therapist, nutritionist, or health care provider about what you can safely eat and drink. Eat healthy foods. If your ability to swallow was affected by the stroke, you may need to take steps to avoid choking, such as: ? Taking small bites when eating. ? Eating foods that are soft or pureed. ? Drinking liquids that have been thickened.  Maintain social connections and interactions with friends, family, and community groups. This is an important part of your recovery. Communication challenges and physical challenges may cause you to feel isolated after a stroke.  Consider joining a support group that allows you to talk about the impact of stroke on your life. A psychologist or counselor may be recommended. Your emotional recovery from stroke is just as important as your physical recovery.  Keep all follow-up visits as told by your health care providers. This is important. Summary  Stroke rehabilitation includes a variety of treatments to help you  recover and promote your independence after a stroke.  Rehabilitation will start as soon as you are able to participate after your stroke, and it includes care from a team of experts.  The intensity of therapy depends on the type and severity of your stroke. You may need therapy for several months. This information is not intended to replace advice given to you by your health care provider. Make sure you discuss any questions you have with your health care provider. Document Revised: 03/13/2019 Document Reviewed: 11/22/2016 Elsevier Patient Education  2020 [employer]   Eating Plan After Stroke A stroke causes damage to the brain cells, which can affect your ability to walk, talk, and even eat. The impact of a stroke is different for everyone, and so is recovery. A good nutrition plan is important for your recovery. It can also lower your risk of another stroke. If you have difficulty chewing and swallowing your food, a dietitian or your stroke care team can help so that you can enjoy eating healthy foods. What are tips for following this plan?  Reading food labels  Choose foods that have less than 300 milligrams (mg) of sodium per serving. Limit your sodium intake to less than 1,500 mg per day.  Avoid foods that have saturated fat and trans fat.  Choose foods that are low in cholesterol. Limit the amount of cholesterol you eat each day to less than 200 mg.  Choose foods that are high in fiber. Eat 20-30 grams (g) of fiber each day.  Avoid foods with added sugar. Check the food label for ingredients such as sugar, corn syrup, honey, fructose, molasses, and cane juice. Shopping  At the grocery store, buy most of your food from areas near the walls of the store. This includes: ? Fresh fruits and vegetables. ? Dry grains, beans, nuts, and seeds. ? Fresh seafood, poultry, lean meats, and eggs. ? Low-fat dairy products.  Buy whole ingredients instead of prepackaged foods.  Buy  fresh, in-season fruits and vegetables from local farmers markets.  Buy frozen fruits and vegetables in resealable bags. Cooking  Prepare foods with very little salt. Use herbs or salt-free spices instead.  Cook with heart-healthy oils, such as olive, avocado, canola, soybean, or sunflower oil.  Avoid frying foods. Bake, grill, or broil foods instead.  Remove visible fat and skin from meat and poultry before eating.  Modify food textures as told by your health care provider. Meal planning  Eat a wide variety of colorful fruits and vegetables. Make sure   one-half of your plate is filled with fruits and vegetables at each meal.  Eat fruits and vegetables that are high in potassium, such as: ? Apples, bananas, oranges, and melon. ? Sweet potatoes, spinach, zucchini, and tomatoes.  Eat fish that contain heart-healthy fats (omega-3 fats) at least twice a week. These include salmon, tuna, mackerel, and sardines.  Eat plant foods that are high in omega-3 fats, such as flaxseeds and walnuts. Add these to cereals, yogurt, or pasta dishes.  Eat several servings of high-fiber foods each day, such as fruits, vegetables, whole grains, and beans.  Do not put salt at the table for meals.  When eating out at restaurants: ? Ask the server about low-salt or salt-free food options. ? Avoid fried foods. Look for menu items that are grilled, steamed, broiled, or roasted. ? Ask if your food can be prepared without butter. ? Ask for condiments, such as salad dressings, gravy, or sauces to be served on the side.  If you have difficulty swallowing: ? Choose foods that are softer and easier to chew and swallow. ? Cut foods into small pieces and chew well before swallowing. ? Thicken liquids as told by your health care provider or dietitian. ? Let your health care provider know if your condition does not improve over time. You may need to work with a speech therapist to re-train the muscles that are used  for eating. General recommendations  Involve your family and friends in your recovery, if possible. It may be helpful to have a slower meal time and to plan meals that include foods everyone in the family can eat.  Brush your teeth with fluoride toothpaste twice a day, and floss once a day. Keeping a clean mouth can help you swallow and can also help your appetite.  Drink enough water each day to keep your urine pale yellow. If needed, set reminders or ask your family to help you remember to drink water.  Limit alcohol intake to no more than 1 drink a day for nonpregnant women and 2 drinks a day for men. One drink equals 12 oz of beer, 5 oz of wine, or 1 oz of hard liquor. Summary  Following this eating plan can help in your stroke recovery and can decrease your risk for another stroke.  Let your health care provider know if you have problems with swallowing. You may need to work with a speech therapist. This information is not intended to replace advice given to you by your health care provider. Make sure you discuss any questions you have with your health care provider. Document Revised: 03/14/2019 Document Reviewed: 01/29/2018 Elsevier Patient Education  2020 [employer]   Physical Therapy After a Stroke After a stroke, some people experience physical changes or problems. Physical therapy may be prescribed to help you recover and overcome problems such as:  Inability to move (paralysis) or weakness, typically affecting one side of the body.  Trouble with balance.  Pain, a pins and needles sensation, or numbness in certain parts of the body. You may also have difficulty feeling touch, pressure, or changes in temperature.  Involuntary muscle tightening (spasticity).  Stiffness in muscles and joints.  Altered coordination and reflexes. What causes physical disability after a stroke? A stroke can damage parts of your brain that control your body's normal functions, including  your ability to move and to keep your balance. The types of physical problems you have will depend on how severe the stroke was and where it was   located in the brain. Weakness or paralysis may affect just your fingers and hands, a whole leg or arm, or an entire side of your body. What is physical therapy? Physical therapy involves using exercises, stretches, and activities to help you regain movement and independence after your stroke. Physical therapy may focus on one or more of the following:  Range of motion. This can help with movement and reduce muscle stiffness.  Balance. This helps to lower your risk of falling.  Position changes or transfers, such as moving from sitting to standing or from a chair to a bed.  Coordination, such as getting an object from a shelf.  Muscle strength. Muscles may be strengthened with weights or by repeating certain motions.  Functional mobility. This may include stair training or learning how to use a wheelchair, walker, or cane.  Walking (gait training).  Activities of daily living, such as getting out of the car or buttoning a shirt. Why is physical therapy important? It is important to do exercises and follow your rehabilitation plan as told by your physical therapist. Physical therapy can:  Help you regain independence.  Prevent injury from falls by building strength and balance.  Lower your risk of blood clots.  Lower your risk of skin sores (pressure injuries).  Increase physical activity and exercise. This may help lower your risk for another stroke.  Help reduce pain. When will therapy start and where will I have therapy? Your health care provider will decide when it is best for you to start therapy. In some cases, people start rehabilitation, including physical therapy, as soon as they are medically stable, which may be 24-48 hours after a stroke. Rehabilitation can take place in a few different places, based on your needs. It may take  place in:  The hospital or an in-patient rehabilitation hospital.  An outpatient rehabilitation facility.  A long-term care facility.  A community rehabilitation clinic.  Your home. What are assistive devices? Assistive devices are tools to help you move, maintain balance, and manage daily tasks while recovering from a stroke. Your physical therapist may recommend and help you learn to use:  Equipment to help you move, such as wheelchairs, canes, or walkers.  Braces or splints to keep your arms, hands, legs, or feet in a comfortable and safe position.  Bathtub benches or grab bars to keep you safe in the bathroom.  Special utensils, bowls, and plates that allow you to eat with one hand. It is important to use these devices as told by your health care provider. Summary  After a stroke, some people may experience physical disabilities, such as weakness or paralysis, pain, or balance problems.  Physical therapy involves exercises, stretches, and activities that help to improve your ability to move and to handle daily tasks.  Physical therapy exercises focus on restoring range of motion, balance, coordination, muscle strength, and the ability to move (mobility).  Physical therapy can help you regain independence, prevent falls, and allow you to live a more active lifestyle after a stroke. This information is not intended to replace advice given to you by your health care provider. Make sure you discuss any questions you have with your health care provider. Document Revised: 03/14/2019 Document Reviewed: 02/27/2017 [employer] Patient Education  2020 [employer]   Sensory Loss After a Stroke A stroke can damage parts of your brain that control your body's normal functions, including your senses. As a result, you may have sensory loss, such as trouble seeing, tasting, swallowing,   or feeling touch or pressure sensations. You may have problems feeling temperature changes or moving your  body in a coordinated way. You may also perceive smell differently. You may have problems with all of your senses or only some of them. By following a treatment plan, you may recover lost senses and manage the changes to your lifestyle. What are treatment therapies for sensory loss? You may have a combination of therapies for sensory loss.  Physical therapy. This may include: ? Exercises to improve your coordination and balance. ? Exercises that combine touch, balance, and movement (sensorimotor training). ? Movements to relieve pressure while you are sitting or lying (mobility training). ? Splints or braces to protect parts of your body that you cannot feel.  Devices to help with blood flow (circulation) and to help stimulate nerves in affected parts of your body.  Speech therapy to help you swallow safely.  Occupational therapy to help you with everyday tasks. This may include: ? Exercises or devices to improve your vision. ? Exercises to help you increase your touch perception. These may include feeling objects of different sizes and textures while your eyes are closed. ? Techniques for moving safely in your environment.  Prescription eye glasses for vision loss.  Hearing aids for hearing loss. Follow these instructions at home: Safety   Your risk of falling is higher after a stroke. You may have difficulty feeling your legs and feet or coordinating your movements. To lower your risk of falling: ? Use devices to help you move around (assistive devices), such as a wheelchair or walker, as directed by your health care team. ? Wear prescription eye glasses at all times when moving around. ? Use lights to help you see in the dark. ? Use grab bars in bathrooms and handrails in stairways. ? Keep walkways clear in your home by removing rugs, cords, and clutter from the floor.  Your risk of getting burned is higher after a stroke. To lower your risk of burns: ? Test the water  temperature before taking a bath or washing your hands. ? Allow hot foods to cool slightly before eating. ? Use potholders when handling hot pans.  When using sharp objects, such as scissors or knives, use your healthy hand. Do not handle sharp objects with your hand that was affected by your stroke, if this applies. Activity  Return to your normal activities as told by your health care provider. Ask your health care provider what activities are safe for you.  Avoid spending too much time sitting or lying down. If you must be in a chair or bed, change positions regularly.  You may have problems doing your normal activities. Ask your health care provider about getting extra help at home. Eating and drinking   You may have problems swallowing food and fluids after a stroke. The problems can be due to: ? Changes in your muscles. ? Sensory changes, such as:  Difficulty feeling the consistency or size of a piece of food in your mouth.  Inability to feel the need to clear your throat.  You may need to: ? Take smaller bites and chew thoroughly. Make sure you have swallowed all the food in your mouth before you take another bite. ? Sit in an upright position when eating or drinking. ? Avoid distractions while eating or drinking. ? Stay upright for 30-45 minutes after eating. ? Change the texture of some things that you eat and drink. This may include:  Changing foods to a   smooth, mashed consistency (puree).  Thickening liquids.  Follow instructions from your health care provider about eating and drinking restrictions. General instructions  Wear arm or leg braces as told by your health care team.  Get help at home as needed. You may need help getting dressed, bathing, using the bathroom, eating, or doing other activities.  Keep all follow-up visits as told by your health care providers. This is important. Summary  It is common to have sensory loss after a stroke. Sensory loss means  that you have problems with some or all of your senses, such as vision, taste, hearing, smell, and touch.  Sensory loss happens because of damage to your brain and nervous system after a stroke.  Treatment for sensory loss may include physical, occupational, or speech therapy, and the use of assistive devices.  You may need to make changes to your home and lifestyle after a stroke to help you live safely and independently. This information is not intended to replace advice given to you by your health care provider. Make sure you discuss any questions you have with your health care provider. Document Revised: 03/15/2019 Document Reviewed: 03/10/2017 [employer] Patient Education  2020 [employer]  

## 2020-04-17 NOTE — Discharge Summary (Signed)
Physician Discharge Summary  David Dougherty [id number] DOB: [birth year] DOA: 04/15/2020  PCP: Kathyrn Drown, MD  Admit date: 04/15/2020 Discharge date: 04/17/2020  Admitted From: Home Disposition: Home  Recommendations for Outpatient Follow-up:  1. Follow up with PCP in 1-2 weeks 2. Please obtain BMP/CBC in one week your next doctors visit.  3. Started Xarelto starter pack.   4. Lipitor 40 mg daily 5. Follow-up patient neurology in 4 weeks  Discharge Condition: Stable CODE STATUS: Full code  Diet recommendation: Heart healthy  Brief/Interim Summary: 69 year old history of chronic atrial fibrillation, CAD, nonischemic cardiomyopathy EF 40%, HTN, HLD presented with left-sided hemiparesis, and dysarthric speech that started around 1230 on 04/15/2020.  Upon admission to the hospital he was offered TPA but patient refused.  Extensive work-up confirmed on MRI that he had a right thalamic infarct.  Patient was seen by neurology, appropriate work-up was performed.  Started on Xarelto and Lipitor.  Stable for discharge today.   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  MRI of the brain confirmed right acute lateral thalamic infarct. LDL 98, A1c 6.0 Echocardiogram-EF 55 to 60% Carotid Dopplers-less than 50% stenosis bilaterally Neurology consulted-recommend starting Xarelto.  Starter pack prescription given Lipitor 40 mg daily PT/OT-no follow-up necessary  Chronic atrial fibrillation, permanent Anticoagulation stopped about 2 years ago.  Currently on Coreg. He would benefit from full dose anticoagulation.  Patient would like to think about this.  Previously documented by PCP that patient also does not  want to be in any sort of long-term anticoagulation.  Nonischemic cardiomyopathy with ejection fraction 40% Echocardiogram from 2011.    Echocardiogram shows EF of 55 to 60%. Currently appears to be euvolemic and without chest pain.  Essential hypertension Permissive hypertension  History of daily headache On Topamax.   Discharge Diagnoses:  Active Problems:   Atrial fibrillation (HCC)   Chronic systolic congestive heart failure (HCC)   Left hemiparesis ([place])   Acute CVA (cerebrovascular accident) [facility])    Consultations:  Neurology  Subjective: Feels okay no complaints.  Wishes to go home wife at bedside. Agreeable for full dose anticoagulation-Xarelto.  Discharge Exam: Vitals:   04/17/20 0316 04/17/20 0651  BP: (!) 145/81 (!) 134/107  Pulse: 84 68  Resp: 16 17  Temp: 97.9 F (36.6 C) (!) 97.5 F (36.4 C)  SpO2: 98% 100%   Vitals:   04/16/20 1933 04/16/20 2325 04/17/20 0316 04/17/20 0651  BP:  115/79 (!) 145/81 (!) 134/107  Pulse:  71 84 68  Resp:  16 16 17   Temp:  98 F (36.7 C) 97.9 F (36.6 C) (!) 97.5 F (36.4 C)  TempSrc:  Oral Oral Oral  SpO2: 100% 95% 98% 100%  Weight:      Height:        General: Pt is alert, awake, not in acute distress Cardiovascular: RRR, S1/S2 +, no rubs, no gallops Respiratory: CTA bilaterally, no wheezing, no rhonchi Abdominal: Soft, NT, ND, bowel sounds + Extremities: no edema, no cyanosis  Discharge Instructions  Discharge Instructions    Discharge patient   Complete by: As directed    Discharge disposition: 01-Home or Self Care   Discharge patient date: 04/17/2020     Allergies as of  04/17/2020      Reactions   Reglan [metoclopramide] Anaphylaxis      Medication List    TAKE these medications   acetaminophen 500 MG tablet Commonly known as: TYLENOL Take 1,000 mg by mouth every 6 (six) hours as needed for mild pain or headache.   aspirin EC 81 MG tablet Take 81 mg by mouth daily.   atorvastatin 40  MG tablet Commonly known as: LIPITOR Take 1 tablet (40 mg total) by mouth daily.   calcium carbonate 500 MG chewable tablet Commonly known as: TUMS - dosed in mg elemental calcium Chew 2-3 tablets by mouth as needed for indigestion or heartburn.   carvedilol 3.125 MG tablet Commonly known as: COREG Take 1 tablet (3.125 mg total) by mouth 2 (two) times daily.   furosemide 20 MG tablet Commonly known as: LASIX Take 1 tablet (20 mg total) by mouth daily.   losartan 25 MG tablet Commonly known as: COZAAR Take 1 tablet (25 mg total) by mouth daily.   nitroGLYCERIN 0.4 MG SL tablet Commonly known as: NITROSTAT PLACE 1 TAB UNDER TONGUE EVERY 5 MIN IF NEEDED FOR CHEST PAIN. MAY USE 3 TIMES.NO RELIEF CALL 911. What changed: See the new instructions.   NyQuil Severe Cold/Flu 5-6.25-10-325 MG/15ML Liqd Generic drug: Phenyleph-Doxylamine-DM-APAP Take 15 mLs by mouth at bedtime as needed (congestion).   omeprazole 20 MG capsule Commonly known as: PRILOSEC TAKE 1 CAPSULE BY MOUTH ONCE DAILY 30  MINUTES  PRIOR  TO  BREAKFAST   oxymetazoline 0.05 % nasal spray Commonly known as: AFRIN Place 1 spray into both nostrils 2 (two) times daily as needed for congestion.   Rivaroxaban Stater Pack (15 mg and 20 mg) Commonly known as: XARELTO STARTER PACK Follow package directions: Take one 15mg  tablet by mouth twice a day. On day 22, switch to one 20mg  tablet once a day. Take with food.   topiramate 50 MG tablet Commonly known as: TOPAMAX Take 1 tablet (50 mg total) by mouth 2 (two) times daily.   Vicks DayQuil Severe Cold/Flu 5-10-200-325 MG/15ML Liqd Generic drug: Phenylephrine-DM-GG-APAP Take 15 mLs by mouth 2 (two) times daily as needed (congestion).   Vitamin D 125 MCG (5000 UT) Caps Take 15,000 Units by mouth daily.      Follow-up Information    Kathyrn Drown, MD. Schedule an appointment as soon as possible for a visit on 04/27/2020.   Specialty: Family Medicine Why:  1:00 Contact information: [address] [place] [address] [phone]        Phillips Odor, MD. Schedule an appointment as soon as possible for a visit on 05/19/2020.   Specialty: Neurology Why: 10:00 Please call office prior to appointment for instructions. Contact information: [address] [place] [address] [phone]          Allergies  Allergen Reactions  . Reglan [Metoclopramide] Anaphylaxis    You were cared for by a hospitalist during your hospital stay. If you have any questions about your discharge medications or the care you received while you were in the hospital after you are discharged, you can call the unit and asked to speak with the hospitalist on call if the hospitalist that took care of you is not available. Once you are discharged, your primary care physician will handle any further medical issues. Please note that no refills for any discharge medications will be authorized once you are discharged, as it is imperative that you return to your primary care physician (or  establish a relationship with a primary care physician if you do not have one) for your aftercare needs so that they can reassess your need for medications and monitor your lab values.   Procedures/Studies: DG Chest 2 View  Result Date: 04/15/2020 CLINICAL DATA:  Recent stroke EXAM: CHEST - 2 VIEW COMPARISON:  11/18/2015 FINDINGS: Cardiac shadow remains enlarged. The lungs are well aerated bilaterally. No focal infiltrate or sizable effusion is seen. Degenerative changes of the thoracic spine are noted. IMPRESSION: No acute abnormality noted. Electronically Signed   By: Inez Catalina M.D.   On: 04/15/2020 21:11   MR ANGIO HEAD WO CONTRAST  Result Date: 04/15/2020 CLINICAL DATA:  Stroke follow-up. Dizziness and left facial droop EXAM: MRI HEAD WITHOUT CONTRAST MRA HEAD WITHOUT CONTRAST TECHNIQUE: Multiplanar, multiecho pulse sequences of the brain and surrounding  structures were obtained without intravenous contrast. Angiographic images of the head were obtained using MRA technique without contrast. COMPARISON:  CTA head neck 04/15/2020 FINDINGS: MRI HEAD FINDINGS Brain: Small area of abnormal diffusion restriction within the lateral right thalamus, along the posterior limb of the right internal capsule. No other diffusion abnormality. No acute hemorrhage. No midline shift or other mass effect. Normal white matter signal. Normal volume of CSF spaces. No chronic microhemorrhage. Normal midline structures. Vascular: Normal flow voids. Skull and upper cervical spine: Normal marrow signal. Sinuses/Orbits: Negative. Other: None. MRA HEAD FINDINGS POSTERIOR CIRCULATION: --Vertebral arteries: Limited flow related enhancement of the left V4 segment. --Inferior cerebellar arteries: Normal. --Basilar artery: Normal. --Superior cerebellar arteries: Normal. --Posterior cerebral arteries: Normal. There are bilateral posterior communicating arteries (p-comm) that partially supply the PCAs. ANTERIOR CIRCULATION: --Intracranial internal carotid arteries: Normal. --Anterior cerebral arteries (ACA): Normal. Both A1 segments are present. Patent anterior communicating artery (a-comm). --Middle cerebral arteries (MCA): Normal. IMPRESSION: 1. Small acute infarct of the lateral right thalamus, along the posterior limb of the right internal capsule. No hemorrhage or mass effect. 2. Diminished flow-related enhancement of the left vertebral artery V4 segment, likely indicating stenosis. Otherwise normal intracranial MRA. Electronically Signed   By: Ulyses Jarred M.D.   On: 04/15/2020 20:58   MR BRAIN WO CONTRAST  Result Date: 04/15/2020 CLINICAL DATA:  Stroke follow-up. Dizziness and left facial droop EXAM: MRI HEAD WITHOUT CONTRAST MRA HEAD WITHOUT CONTRAST TECHNIQUE: Multiplanar, multiecho pulse sequences of the brain and surrounding structures were obtained without intravenous contrast.  Angiographic images of the head were obtained using MRA technique without contrast. COMPARISON:  CTA head neck 04/15/2020 FINDINGS: MRI HEAD FINDINGS Brain: Small area of abnormal diffusion restriction within the lateral right thalamus, along the posterior limb of the right internal capsule. No other diffusion abnormality. No acute hemorrhage. No midline shift or other mass effect. Normal white matter signal. Normal volume of CSF spaces. No chronic microhemorrhage. Normal midline structures. Vascular: Normal flow voids. Skull and upper cervical spine: Normal marrow signal. Sinuses/Orbits: Negative. Other: None. MRA HEAD FINDINGS POSTERIOR CIRCULATION: --Vertebral arteries: Limited flow related enhancement of the left V4 segment. --Inferior cerebellar arteries: Normal. --Basilar artery: Normal. --Superior cerebellar arteries: Normal. --Posterior cerebral arteries: Normal. There are bilateral posterior communicating arteries (p-comm) that partially supply the PCAs. ANTERIOR CIRCULATION: --Intracranial internal carotid arteries: Normal. --Anterior cerebral arteries (ACA): Normal. Both A1 segments are present. Patent anterior communicating artery (a-comm). --Middle cerebral arteries (MCA): Normal. IMPRESSION: 1. Small acute infarct of the lateral right thalamus, along the posterior limb of the right internal capsule. No hemorrhage or mass effect. 2. Diminished flow-related enhancement of the left vertebral artery  V4 segment, likely indicating stenosis. Otherwise normal intracranial MRA. Electronically Signed   By: Ulyses Jarred M.D.   On: 04/15/2020 20:58   US Carotid Bilateral (at [facility] and [facility] only)  Result Date: 04/16/2020 CLINICAL DATA:  Acute ischemic stroke. EXAM: BILATERAL CAROTID DUPLEX ULTRASOUND TECHNIQUE: Pearline Cables scale imaging, color Doppler and duplex ultrasound were performed of bilateral carotid and vertebral arteries in the neck. COMPARISON:  None. FINDINGS: Criteria: Quantification of carotid stenosis is  based on velocity parameters that correlate the residual internal carotid diameter with NASCET-based stenosis levels, using the diameter of the distal internal carotid lumen as the denominator for stenosis measurement. The following velocity measurements were obtained: RIGHT ICA: 104/20 cm/sec CCA: [id number] cm/sec SYSTOLIC ICA/CCA RATIO:  1.1 ECA: 111 cm/sec LEFT ICA: 93/31 cm/sec CCA: [id number] cm/sec SYSTOLIC ICA/CCA RATIO:  1.0 ECA: 98 cm/sec RIGHT CAROTID ARTERY: Intimal thickening in the distal common carotid artery. External carotid artery is patent with normal waveform. Minimal plaque in the proximal internal carotid artery. Normal waveforms and velocities in the internal carotid artery. RIGHT VERTEBRAL ARTERY: Antegrade flow and normal waveform in the right vertebral artery. LEFT CAROTID ARTERY: External carotid artery is patent with normal waveform. Minimal plaque in the left internal carotid artery. Normal waveforms and velocities in the internal carotid artery. LEFT VERTEBRAL ARTERY: Antegrade flow and normal waveform in the left vertebral artery. IMPRESSION: 1. Minimal atherosclerotic disease in the carotid arteries. No significant carotid artery stenosis. Estimated degree of stenosis in the internal carotid arteries is less than 50% bilaterally. 2. Patent vertebral arteries with antegrade flow. Electronically Signed   By: Markus Daft M.D.   On: 04/16/2020 12:16   CT CEREBRAL PERFUSION W CONTRAST  Result Date: 04/15/2020 CLINICAL DATA:  Left-sided facial droop, dizziness EXAM: CT ANGIOGRAPHY HEAD AND NECK CT PERFUSION BRAIN TECHNIQUE: Multidetector CT imaging of the head and neck was performed using the standard protocol during bolus administration of intravenous contrast. Multiplanar CT image reconstructions and MIPs were obtained to evaluate the vascular anatomy. Carotid stenosis measurements (when applicable) are obtained utilizing NASCET criteria, using the distal internal carotid diameter as the  denominator. Multiphase CT imaging of the brain was performed following IV bolus contrast injection. Subsequent parametric perfusion maps were calculated using RAPID software. CONTRAST:  115mL OMNIPAQUE IOHEXOL 350 MG/ML SOLN COMPARISON:  None. FINDINGS: CT HEAD FINDINGS Brain: No acute intracranial hemorrhage, mass effect, or edema. No new loss of gray-white differentiation since the recent prior study. Vascular: No new findings. Skull: No new findings. Sinuses/Orbits: No new findings. Other: None. ASPECTS ([place] Stroke Program Early CT Score) - Ganglionic level infarction (caudate, lentiform nuclei, internal capsule, insula, M1-M3 cortex): 7 - Supraganglionic infarction (M4-M6 cortex): 3 Total score (0-10 with 10 being normal): 10 Review of the MIP images confirms the above findings CTA NECK FINDINGS Aortic arch: Great vessel origins are patent. Right carotid system: Patent. There is no measurable stenosis at the ICA origin. Left carotid system: Patent. There is no measurable stenosis at the ICA origin. Vertebral arteries: Patent and nearly codominant. Plaque at the left vertebral origin causes mild to moderate stenosis. Skeleton: Cervical spine degenerative changes without high-grade osseous encroachment on the spinal canal. Other neck: No mass or adenopathy. Upper chest: No apical lung mass. Review of the MIP images confirms the above findings CTA HEAD FINDINGS Anterior circulation: Intracranial internal carotids are patent with calcified plaque causing mild stenosis. Anterior and middle cerebral arteries are patent. Posterior circulation: Intracranial vertebral arteries, basilar artery, and posterior cerebral arteries  are patent. There are patent bilateral posterior communicating arteries. Venous sinuses: As permitted by contrast timing, patent. Review of the MIP images confirms the above findings CT Brain Perfusion Findings: CBF (<30%) Volume: 62mL Perfusion (Tmax>6.0s) volume: 49mL Mismatch Volume: 51mL  Infarction Location:None IMPRESSION: No acute intracranial hemorrhage or evidence of acute infarction. ASPECT score is 10. There is no large vessel occlusion or hemodynamically significant stenosis. Perfusion imaging demonstrates no evidence of core infarction or penumbra. Electronically Signed   By: Macy Mis M.D.   On: 04/15/2020 15:56   ECHOCARDIOGRAM COMPLETE  Result Date: 04/16/2020    ECHOCARDIOGRAM REPORT   Patient Name:   HYATT EDUARDO Date of Exam: 04/16/2020 Medical Rec #:  [id number]          Height:       73.0 in Accession #:    [id number]         Weight:       230.2 lb Date of Birth:  1951-08-13         BSA:          2.284 m Patient Age:    8 years           BP:           120/82 mmHg Patient Gender: M                  HR:           89 bpm. Exam Location:  [facility] Procedure: 2D Echo, Cardiac Doppler and Color Doppler Indications:    Stroke 434.91 / I163.9  History:        Patient has prior history of Echocardiogram examinations, most                 recent 03/12/2015. Cardiomyopathy, CAD, Arrythmias:Atrial                 Fibrillation; Risk Factors:Current Smoker and GERD.  Sonographer:    Jonelle Sidle Dance Referring Phys: [email or web address].Ahr DAVID TAT IMPRESSIONS  1. Left ventricular ejection fraction, by estimation, is 50 to 55%. The left ventricle has low normal function. The left ventricle has no regional wall motion abnormalities. There is mild concentric left ventricular hypertrophy. Left ventricular diastolic parameters are indeterminate.  2. Right ventricular systolic function is low normal. The right ventricular size is normal.  3. Right atrial size was mildly dilated.  4. The mitral valve is grossly normal. Trivial mitral valve regurgitation.  5. The aortic valve is tricuspid. Aortic valve regurgitation is not visualized. No aortic stenosis is present.  6. Aortic dilatation noted. There is mild dilatation of the aortic root.  7. The inferior vena cava is normal in size with greater than 50%  respiratory variability, suggesting right atrial pressure of 3 mmHg. FINDINGS  Left Ventricle: Left ventricular ejection fraction, by estimation, is 50 to 55%. The left ventricle has low normal function. The left ventricle has no regional wall motion abnormalities. The left ventricular internal cavity size was normal in size. There is mild concentric left ventricular hypertrophy. Left ventricular diastolic parameters are indeterminate. Right Ventricle: The right ventricular size is normal. No increase in right ventricular wall thickness. Right ventricular systolic function is low normal. Left Atrium: Left atrial size was normal in size. Right Atrium: Right atrial size was mildly dilated. Pericardium: There is no evidence of pericardial effusion. Mitral Valve: The mitral valve is grossly normal. Mild mitral annular calcification. Trivial mitral valve regurgitation. Tricuspid Valve: The tricuspid valve is grossly  normal. Tricuspid valve regurgitation is not demonstrated. Aortic Valve: The aortic valve is tricuspid. Aortic valve regurgitation is not visualized. No aortic stenosis is present. Mild aortic valve annular calcification. Pulmonic Valve: The pulmonic valve was grossly normal. Pulmonic valve regurgitation is not visualized. Aorta: Aortic dilatation noted. There is mild dilatation of the aortic root. Venous: The inferior vena cava is normal in size with greater than 50% respiratory variability, suggesting right atrial pressure of 3 mmHg. IAS/Shunts: No atrial level shunt detected by color flow Doppler.  LEFT VENTRICLE PLAX 2D LVIDd:         5.03 cm LVIDs:         3.75 cm LV PW:         1.30 cm LV IVS:        1.23 cm LVOT diam:     2.20 cm LV SV:         51 LV SV Index:   22 LVOT Area:     3.80 cm  RIGHT VENTRICLE RV Basal diam:  2.97 cm RV S prime:     9.36 cm/s TAPSE (M-mode): 1.8 cm LEFT ATRIUM             Index       RIGHT ATRIUM           Index LA diam:        4.60 cm 2.01 cm/m  RA Area:     19.90 cm LA  Vol (A2C):   71.5 ml 31.30 ml/m RA Volume:   51.10 ml  22.37 ml/m LA Vol (A4C):   60.1 ml 26.31 ml/m LA Biplane Vol: 68.5 ml 29.99 ml/m  AORTIC VALVE LVOT Vmax:   79.55 cm/s LVOT Vmean:  46.450 cm/s LVOT VTI:    0.133 m  AORTA Ao Root diam: 3.80 cm Ao Asc diam:  3.50 cm MITRAL VALVE MV Area (PHT): 3.42 cm    SHUNTS MV Decel Time: 222 msec    Systemic VTI:  0.13 m MV E velocity: 97.65 cm/s  Systemic Diam: 2.20 cm Kate Sable MD Electronically signed by Kate Sable MD Signature Date/Time: 04/16/2020/10:42:38 AM    Final    CT HEAD CODE STROKE WO CONTRAST  Result Date: 04/15/2020 CLINICAL DATA:  Code stroke. Focal neuro deficit greater than 6 hours. Left leg weakness and slurred speech EXAM: CT HEAD WITHOUT CONTRAST TECHNIQUE: Contiguous axial images were obtained from the base of the skull through the vertex without intravenous contrast. COMPARISON:  CT head 05/14/2017 FINDINGS: Brain: No evidence of acute infarction, hemorrhage, hydrocephalus, extra-axial collection or mass lesion/mass effect. Vascular: Negative for hyperdense vessel Skull: Negative Sinuses/Orbits: Mucosal edema paranasal sinuses. Hyperdense secretions in the sphenoid sinus may represent fungal infection. Bilateral cataract extraction. Other: None ASPECTS ([place] Stroke Program Early CT Score) - Ganglionic level infarction (caudate, lentiform nuclei, internal capsule, insula, M1-M3 cortex): 7 - Supraganglionic infarction (M4-M6 cortex): 3 Total score (0-10 with 10 being normal): 10 IMPRESSION: 1. No acute intracranial abnormality 2. ASPECTS is 10 3. These results were called by telephone at the time of interpretation on 04/15/2020 at 2:09 pm to provider JOSHUA LONG , who verbally acknowledged these results. Electronically Signed   By: Franchot Gallo M.D.   On: 04/15/2020 14:10   CT ANGIO HEAD CODE STROKE  Result Date: 04/15/2020 CLINICAL DATA:  Left-sided facial droop, dizziness EXAM: CT ANGIOGRAPHY HEAD AND NECK CT PERFUSION  BRAIN TECHNIQUE: Multidetector CT imaging of the head and neck was performed using the standard protocol during bolus  administration of intravenous contrast. Multiplanar CT image reconstructions and MIPs were obtained to evaluate the vascular anatomy. Carotid stenosis measurements (when applicable) are obtained utilizing NASCET criteria, using the distal internal carotid diameter as the denominator. Multiphase CT imaging of the brain was performed following IV bolus contrast injection. Subsequent parametric perfusion maps were calculated using RAPID software. CONTRAST:  147mL OMNIPAQUE IOHEXOL 350 MG/ML SOLN COMPARISON:  None. FINDINGS: CT HEAD FINDINGS Brain: No acute intracranial hemorrhage, mass effect, or edema. No new loss of gray-white differentiation since the recent prior study. Vascular: No new findings. Skull: No new findings. Sinuses/Orbits: No new findings. Other: None. ASPECTS ([place] Stroke Program Early CT Score) - Ganglionic level infarction (caudate, lentiform nuclei, internal capsule, insula, M1-M3 cortex): 7 - Supraganglionic infarction (M4-M6 cortex): 3 Total score (0-10 with 10 being normal): 10 Review of the MIP images confirms the above findings CTA NECK FINDINGS Aortic arch: Great vessel origins are patent. Right carotid system: Patent. There is no measurable stenosis at the ICA origin. Left carotid system: Patent. There is no measurable stenosis at the ICA origin. Vertebral arteries: Patent and nearly codominant. Plaque at the left vertebral origin causes mild to moderate stenosis. Skeleton: Cervical spine degenerative changes without high-grade osseous encroachment on the spinal canal. Other neck: No mass or adenopathy. Upper chest: No apical lung mass. Review of the MIP images confirms the above findings CTA HEAD FINDINGS Anterior circulation: Intracranial internal carotids are patent with calcified plaque causing mild stenosis. Anterior and middle cerebral arteries are patent. Posterior  circulation: Intracranial vertebral arteries, basilar artery, and posterior cerebral arteries are patent. There are patent bilateral posterior communicating arteries. Venous sinuses: As permitted by contrast timing, patent. Review of the MIP images confirms the above findings CT Brain Perfusion Findings: CBF (<30%) Volume: 52mL Perfusion (Tmax>6.0s) volume: 54mL Mismatch Volume: 36mL Infarction Location:None IMPRESSION: No acute intracranial hemorrhage or evidence of acute infarction. ASPECT score is 10. There is no large vessel occlusion or hemodynamically significant stenosis. Perfusion imaging demonstrates no evidence of core infarction or penumbra. Electronically Signed   By: Macy Mis M.D.   On: 04/15/2020 15:56   CT ANGIO NECK CODE STROKE  Result Date: 04/15/2020 CLINICAL DATA:  Left-sided facial droop, dizziness EXAM: CT ANGIOGRAPHY HEAD AND NECK CT PERFUSION BRAIN TECHNIQUE: Multidetector CT imaging of the head and neck was performed using the standard protocol during bolus administration of intravenous contrast. Multiplanar CT image reconstructions and MIPs were obtained to evaluate the vascular anatomy. Carotid stenosis measurements (when applicable) are obtained utilizing NASCET criteria, using the distal internal carotid diameter as the denominator. Multiphase CT imaging of the brain was performed following IV bolus contrast injection. Subsequent parametric perfusion maps were calculated using RAPID software. CONTRAST:  157mL OMNIPAQUE IOHEXOL 350 MG/ML SOLN COMPARISON:  None. FINDINGS: CT HEAD FINDINGS Brain: No acute intracranial hemorrhage, mass effect, or edema. No new loss of gray-white differentiation since the recent prior study. Vascular: No new findings. Skull: No new findings. Sinuses/Orbits: No new findings. Other: None. ASPECTS (Epps Stroke Program Early CT Score) - Ganglionic level infarction (caudate, lentiform nuclei, internal capsule, insula, M1-M3 cortex): 7 - Supraganglionic  infarction (M4-M6 cortex): 3 Total score (0-10 with 10 being normal): 10 Review of the MIP images confirms the above findings CTA NECK FINDINGS Aortic arch: Great vessel origins are patent. Right carotid system: Patent. There is no measurable stenosis at the ICA origin. Left carotid system: Patent. There is no measurable stenosis at the ICA origin. Vertebral arteries: Patent and nearly codominant.  Plaque at the left vertebral origin causes mild to moderate stenosis. Skeleton: Cervical spine degenerative changes without high-grade osseous encroachment on the spinal canal. Other neck: No mass or adenopathy. Upper chest: No apical lung mass. Review of the MIP images confirms the above findings CTA HEAD FINDINGS Anterior circulation: Intracranial internal carotids are patent with calcified plaque causing mild stenosis. Anterior and middle cerebral arteries are patent. Posterior circulation: Intracranial vertebral arteries, basilar artery, and posterior cerebral arteries are patent. There are patent bilateral posterior communicating arteries. Venous sinuses: As permitted by contrast timing, patent. Review of the MIP images confirms the above findings CT Brain Perfusion Findings: CBF (<30%) Volume: 54mL Perfusion (Tmax>6.0s) volume: 29mL Mismatch Volume: 102mL Infarction Location:None IMPRESSION: No acute intracranial hemorrhage or evidence of acute infarction. ASPECT score is 10. There is no large vessel occlusion or hemodynamically significant stenosis. Perfusion imaging demonstrates no evidence of core infarction or penumbra. Electronically Signed   By: Macy Mis M.D.   On: 04/15/2020 15:56      The results of significant diagnostics from this hospitalization (including imaging, microbiology, ancillary and laboratory) are listed below for reference.     Microbiology: Recent Results (from the past 240 hour(s))  SARS Coronavirus 2 by RT PCR (hospital order, performed in [facility] hospital lab) Nasopharyngeal  Nasopharyngeal Swab     Status: None   Collection Time: 04/15/20  2:44 PM   Specimen: Nasopharyngeal Swab  Result Value Ref Range Status   SARS Coronavirus 2 NEGATIVE NEGATIVE Final    Comment: (NOTE) SARS-CoV-2 target nucleic acids are NOT DETECTED. The SARS-CoV-2 RNA is generally detectable in upper and lower respiratory specimens during the acute phase of infection. The lowest concentration of SARS-CoV-2 viral copies this assay can detect is 250 copies / mL. A negative result does not preclude SARS-CoV-2 infection and should not be used as the sole basis for treatment or other patient management decisions.  A negative result may occur with improper specimen collection / handling, submission of specimen other than nasopharyngeal swab, presence of viral mutation(s) within the areas targeted by this assay, and inadequate number of viral copies (<250 copies / mL). A negative result must be combined with clinical observations, patient history, and epidemiological information. Fact Sheet for Patients:   StrictlyIdeas.no Fact Sheet for Healthcare Providers: BankingDealers.co.za This test is not yet approved or cleared  by the [place] FDA and has been authorized for detection and/or diagnosis of SARS-CoV-2 by FDA under an Emergency Use Authorization (EUA).  This EUA will remain in effect (meaning this test can be used) for the duration of the COVID-19 declaration under Section 564(b)(1) of the Act, 21 U.S.C. section 360bbb-3(b)(1), unless the authorization is terminated or revoked sooner. Performed at [facility], [address], [place], [place] [address]      Labs: BNP (last 3 results) No results for input(s): BNP in the last 8760 hours. Basic Metabolic Panel: Recent Labs  Lab 04/15/20 1351 04/15/20 1358 04/17/20 0555  NA 139 142 139  K 3.7 3.8 3.6  CL 108 109 106  CO2 23  --  24  GLUCOSE 95 89 125*  BUN 16 15 17    CREATININE 1.06 1.00 1.06  CALCIUM 8.8*  --  9.2  MG  --   --  2.1   Liver Function Tests: Recent Labs  Lab 04/15/20 1351  AST 20  ALT 20  ALKPHOS 72  BILITOT 0.7  PROT 7.5  ALBUMIN 4.3   No results for input(s): LIPASE, AMYLASE  in the last 168 hours. No results for input(s): AMMONIA in the last 168 hours. CBC: Recent Labs  Lab 04/15/20 1351 04/15/20 1358 04/17/20 0555  WBC 5.0  --  5.8  NEUTROABS 2.8  --   --   HGB 14.9 16.0 14.7  HCT 46.3 47.0 46.5  MCV 91.9  --  91.7  PLT 202  --  192   Cardiac Enzymes: No results for input(s): CKTOTAL, CKMB, CKMBINDEX, TROPONINI in the last 168 hours. BNP: Invalid input(s): POCBNP CBG: Recent Labs  Lab 04/15/20 1405  GLUCAP 112*   D-Dimer No results for input(s): DDIMER in the last 72 hours. Hgb A1c Recent Labs    04/15/20 1351  HGBA1C 6.0*   Lipid Profile Recent Labs    04/16/20 0516  CHOL 163  HDL 34*  LDLCALC 98  TRIG 157*  CHOLHDL 4.8   Thyroid function studies No results for input(s): TSH, T4TOTAL, T3FREE, THYROIDAB in the last 72 hours.  Invalid input(s): FREET3 Anemia work up No results for input(s): VITAMINB12, FOLATE, FERRITIN, TIBC, IRON, RETICCTPCT in the last 72 hours. Urinalysis    Component Value Date/Time   COLORURINE COLORLESS (A) 04/15/2020 1354   APPEARANCEUR CLEAR 04/15/2020 1354   LABSPEC 1.019 04/15/2020 1354   PHURINE 7.0 04/15/2020 1354   GLUCOSEU NEGATIVE 04/15/2020 1354   HGBUR NEGATIVE 04/15/2020 1354   Amasa 04/15/2020 1354   KETONESUR NEGATIVE 04/15/2020 1354   PROTEINUR NEGATIVE 04/15/2020 1354   NITRITE NEGATIVE 04/15/2020 1354   LEUKOCYTESUR NEGATIVE 04/15/2020 1354   Sepsis Labs Invalid input(s): PROCALCITONIN,  WBC,  LACTICIDVEN Microbiology Recent Results (from the past 240 hour(s))  SARS Coronavirus 2 by RT PCR (hospital order, performed in [facility] hospital lab) Nasopharyngeal Nasopharyngeal Swab     Status: None   Collection Time: 04/15/20   2:44 PM   Specimen: Nasopharyngeal Swab  Result Value Ref Range Status   SARS Coronavirus 2 NEGATIVE NEGATIVE Final    Comment: (NOTE) SARS-CoV-2 target nucleic acids are NOT DETECTED. The SARS-CoV-2 RNA is generally detectable in upper and lower respiratory specimens during the acute phase of infection. The lowest concentration of SARS-CoV-2 viral copies this assay can detect is 250 copies / mL. A negative result does not preclude SARS-CoV-2 infection and should not be used as the sole basis for treatment or other patient management decisions.  A negative result may occur with improper specimen collection / handling, submission of specimen other than nasopharyngeal swab, presence of viral mutation(s) within the areas targeted by this assay, and inadequate number of viral copies (<250 copies / mL). A negative result must be combined with clinical observations, patient history, and epidemiological information. Fact Sheet for Patients:   StrictlyIdeas.no Fact Sheet for Healthcare Providers: BankingDealers.co.za This test is not yet approved or cleared  by the [place] FDA and has been authorized for detection and/or diagnosis of SARS-CoV-2 by FDA under an Emergency Use Authorization (EUA).  This EUA will remain in effect (meaning this test can be used) for the duration of the COVID-19 declaration under Section 564(b)(1) of the Act, 21 U.S.C. section 360bbb-3(b)(1), unless the authorization is terminated or revoked sooner. Performed at [facility], [address] [place], [place] [address]      Time coordinating discharge:  I have spent 35 minutes face to face with the patient and on the ward discussing the patients care, assessment, plan and disposition with other care givers. >50% of the time was devoted counseling the patient about the risks and  benefits of treatment/Discharge disposition and coordinating care.    SIGNED:   Damita Lack, MD  [facility] 04/17/2020, 12:32 PM   If 7PM-7AM, please contact night-coverage

## 2020-04-17 NOTE — Plan of Care (Signed)

## 2020-04-17 NOTE — Progress Notes (Addendum)
Discharge instruction and packet given to patient and family at bedside. Information given, education given and reintegated on Stroke Map teachings. Xeralto coupon and packet provided. Education provided on signs and symptoms of bleeding or clots. Family verbalized all needs met and all questions answered. 04/17/2020 @ [place]

## 2020-04-17 NOTE — Telephone Encounter (Signed)
Would be nice for them to clarify on the hospital side before sending out--I used to always assess ability of pt to pay. Oral coumadin is an alternative--start 5 mg numb 30 one tab qhs , rec ov and inr on wed with me.

## 2020-04-17 NOTE — Plan of Care (Signed)
  Problem: Education: Goal: Knowledge of General Education information will improve Description: Including pain rating scale, medication(s)/side effects and non-pharmacologic comfort measures Outcome: Adequate for Discharge   Problem: Health Behavior/Discharge Planning: Goal: Ability to manage health-related needs will improve Outcome: Adequate for Discharge   Problem: Education: Goal: Knowledge of General Education information will improve Description: Including pain rating scale, medication(s)/side effects and non-pharmacologic comfort measures Outcome: Adequate for Discharge   Problem: Education: Goal: Knowledge of General Education information will improve Description: Including pain rating scale, medication(s)/side effects and non-pharmacologic comfort measures Outcome: Adequate for Discharge   Problem: Health Behavior/Discharge Planning: Goal: Ability to manage health-related needs will improve Outcome: Adequate for Discharge   Problem: Clinical Measurements: Goal: Ability to maintain clinical measurements within normal limits will improve Outcome: Adequate for Discharge Goal: Will remain free from infection Outcome: Adequate for Discharge Goal: Diagnostic test results will improve Outcome: Adequate for Discharge Goal: Respiratory complications will improve Outcome: Adequate for Discharge Goal: Cardiovascular complication will be avoided Outcome: Adequate for Discharge

## 2020-04-17 NOTE — Telephone Encounter (Signed)
Pt's daughter calling stating the hospital prescribed a medication to help with the blood clot in pts brain. They provided him a coupon and said it would be free but he doesn't have a medicare supplement plan so the medication will actually cost him over 900. They are wanting to know what another option would be. She states the medication starts with an E but was not sure the name of the medication and its an injection that he gets in his side.

## 2020-04-17 NOTE — Telephone Encounter (Signed)
Called and discussed with pt. Pt verbalized understanding and med sent to pharm.

## 2020-04-17 NOTE — Progress Notes (Signed)
OT Cancellation Note  Patient Details Name: ESPIRIDION CLAEYS MRN: [id number] DOB: 1951/08/19   Cancelled Treatment:    Reason Eval/Treat Not Completed: OT screened, no needs identified, will sign off. Pt screened for OT needs. Pt's initial symptoms have resolved at this time. Pt is performing ADLs and mobility tasks independently, at baseline. No further OT services required at this time.   Guadelupe Sabin, OTR/L  [phone] 04/17/2020, 7:39 AM

## 2020-04-22 ENCOUNTER — Other Ambulatory Visit: Payer: Self-pay

## 2020-04-22 ENCOUNTER — Telehealth: Payer: Self-pay | Admitting: *Deleted

## 2020-04-22 ENCOUNTER — Ambulatory Visit (INDEPENDENT_AMBULATORY_CARE_PROVIDER_SITE_OTHER): Payer: Medicare Other | Admitting: Family Medicine

## 2020-04-22 VITALS — BP 148/90 | Temp 97.4°F | Ht 73.0 in | Wt 203.0 lb

## 2020-04-22 DIAGNOSIS — I429 Cardiomyopathy, unspecified: Secondary | ICD-10-CM | POA: Diagnosis not present

## 2020-04-22 DIAGNOSIS — Z7901 Long term (current) use of anticoagulants: Secondary | ICD-10-CM

## 2020-04-22 DIAGNOSIS — I6381 Other cerebral infarction due to occlusion or stenosis of small artery: Secondary | ICD-10-CM

## 2020-04-22 DIAGNOSIS — I639 Cerebral infarction, unspecified: Secondary | ICD-10-CM | POA: Diagnosis not present

## 2020-04-22 LAB — POCT INR: INR: 1.2 — AB (ref 2.0–3.0)

## 2020-04-22 NOTE — Progress Notes (Signed)
   Subjective:  Patient arrives to the office for follow-up from hospitalization for stroke  Patient ID: David Dougherty, male    DOB: [birth year], 69 y.o.   MRN: [id number]  HPIpt has concerns about his meds.  Should he take aspirin.  How much vit d should he take. Not taking any right now.   Pt stopped taking topiramate, furosemide, losartan and omeprazole.   Taking coumadin 5mg  daily. INR today. 1.2.    Complete hospital records reviewed at length.  All consult notes all imaging reviewed at length  Patient has atrial fibrillation.  In the past to decline for coagulation.  Has been on aspirin.  Experience a thalamic stroke.  Developed weakness in his left arm and left leg.  This has improved.  Patient still somewhat weak but able to walk without difficulties.  Left hand is stronger but still somewhat clumsy.  Patient was evaluated by physical therapy and felt not to need outpatient physical therapy  Patient was given prescription for Xarelto.  This was tremendously pricey.  He called here stating he would not take the medicine.  Started on Coumadin instead.  Patient compliant with meds  Patient has stopped a number of his medications please see assessment and plan for clarification going forward Review of Systems No headache no chest pain no shortness of breath    Objective:   Physical Exam  Alert no acute distress.  HEENT normal lungs clear.  Heart regular rhythm.  Left hand grip slightly diminished.  Left leg strength of 4 out of 5.  Slightly clumsy left hand.  Speech excellent.  Blood pressure improved on reevaluation      Assessment & Plan:  Impression 1 status post thalamic stroke.  Clinically improved.  History of A. fib.  Now anticoagulated with Coumadin.  Still titrating upward.  INR subtherapeutic.  Coumadin increased.  Will reassess in 5 days.  2.  Coumadin use discussed.  Diet discussed.  Information given  3.  Hypertension decent control.  Losartan resumed  primarily for history of cardiomyopathy and borderline for blood pressure  4.  Atrial fib stable  5.  Emotional lability/anxiety.  Patient claims this is not his usual baseline.  His wife agrees with that.  Likely secondary to stroke discussed.  Will use NyQuil for sleep.  Has used in the past with success   Please see rather intensive management of stopping the Xarelto and initiating Coumadin performed over the telephone 1 day post discharge last week   Numerous questions answered  Follow-up next week.  Warning signs discussed.

## 2020-04-22 NOTE — Patient Instructions (Addendum)
Take no aspirin please  Go ahead and stop the fluid pill furosemide  Stop the headache med topiramate  Get back on the blood pressure heart med losartan      Vitamin K Foods and Warfarin Warfarin is a blood thinner (anticoagulant). Anticoagulant medicines help prevent the formation of blood clots. These medicines work by decreasing the activity of vitamin K, which promotes normal blood clotting. When you take warfarin, problems can occur from suddenly increasing or decreasing the amount of vitamin K that you eat from one day to the next. Problems may include:  Blood clots.  Bleeding. What general guidelines do I need to follow? To avoid problems when taking warfarin:  Eat a balanced diet that includes: ? Fresh fruits and vegetables. ? Whole grains. ? Low-fat dairy products. ? Lean proteins, such as fish, eggs, and lean cuts of meat.  Keep your intake of vitamin K consistent from day to day. To do this: ? Avoid eating large amounts of vitamin K one day and low amounts of vitamin K the next day. ? If you take a multivitamin that contains vitamin K, be sure to take it every day. ? Know which foods contain vitamin K. Use the lists below to understand serving sizes and the amount of vitamin K in one serving.  Avoid major changes in your diet. If you are going to change your diet, talk with your health care provider before making changes.  Work with a Financial planner (dietitian) to develop a meal plan that works best for you.  High vitamin K foods Foods that are high in vitamin K contain more than 100 mcg (micrograms) per serving. These include:  Broccoli (cooked) -  cup has 110 mcg.  Brussels sprouts (cooked) -  cup has 109 mcg.  Greens, beet (cooked) -  cup has 350 mcg.  Greens, collard (cooked) -  cup has 418 mcg.  Greens, turnip (cooked) -  cup has 265 mcg.  Green onions or scallions -  cup has 105 mcg.  Kale (fresh or frozen) -  cup has 531  mcg.  Parsley (raw) - 10 sprigs has 164 mcg.  Spinach (cooked) -  cup has 444 mcg.  Swiss chard (cooked) -  cup has 287 mcg. Moderate vitamin K foods Foods that have a moderate amount of vitamin K contain 25-100 mcg per serving. These include:  Asparagus (cooked) - 5 spears have 38 mcg.  Black-eyed peas (dried) -  cup has 32 mcg.  Cabbage (cooked) -  cup has 37 mcg.  Kiwi fruit - 1 medium has 31 mcg.  Lettuce - 1 cup has 57-63 mcg.  Okra (frozen) -  cup has 44 mcg.  Prunes (dried) - 5 prunes have 25 mcg.  Watercress (raw) - 1 cup has 85 mcg. Low vitamin K foods Foods low in vitamin K contain less than 25 mcg per serving. These include:  Artichoke - 1 medium has 18 mcg.  Avocado - 1 oz. has 6 mcg.  Blueberries -  cup has 14 mcg.  Cabbage (raw) -  cup has 21 mcg.  Carrots (cooked) -  cup has 11 mcg.  Cauliflower (raw) -  cup has 11 mcg.  Cucumber with peel (raw) -  cup has 9 mcg.  Grapes -  cup has 12 mcg.  Mango - 1 medium has 9 mcg.  Nuts - 1 oz. has 15 mcg.  Pear - 1 medium has 8 mcg.  Peas (cooked) -  cup has 19 mcg.  Pickles - 1 spear has 14 mcg.  Pumpkin seeds - 1 oz. has 13 mcg.  Sauerkraut (canned) -  cup has 16 mcg.  Soybeans (cooked) -  cup has 16 mcg.  Tomato (raw) - 1 medium has 10 mcg.  Tomato sauce -  cup has 17 mcg. Vitamin K-free foods If a food contain less than 5 mcg per serving, it is considered to have no vitamin K. These foods include:  Bread and cereal products.  Cheese.  Eggs.  Fish and shellfish.  Meat and poultry.  Milk and dairy products.  Sunflower seeds. Actual amounts of vitamin K in foods may be different depending on processing. Talk with your dietitian about what foods you can eat and what foods you should avoid. This information is not intended to replace advice given to you by your health care provider. Make sure you discuss any questions you have with your health care provider. Document  Revised: 11/03/2017 Document Reviewed: 02/24/2016 Elsevier Patient Education  2020 [employer].

## 2020-04-24 ENCOUNTER — Telehealth: Payer: Self-pay | Admitting: Family Medicine

## 2020-04-24 MED ORDER — TIZANIDINE HCL 4 MG PO TABS
ORAL_TABLET | ORAL | 0 refills | Status: DC
Start: 2020-04-24 — End: 2021-03-24

## 2020-04-24 NOTE — Telephone Encounter (Signed)
Please advise. Thank you

## 2020-04-24 NOTE — Telephone Encounter (Signed)
Zanaflex 4 mg one qhs prn sleep/muscle spasm numb 21

## 2020-04-24 NOTE — Telephone Encounter (Signed)
Pt is having trouble with cramps in his left leg and it is keeping him from sleeping. He saw Dr. Richardson Landry for hospital follow up on stroke this week and is scheduled to see Dr. Nicki Reaper on Monday. Pt would like to know if something could be called into help cramp.   [facility]

## 2020-04-24 NOTE — Telephone Encounter (Signed)
Pt wife called [place] is not getting any sleep up and down very uncomfortable. Was in here for hospital Follow up on 04/22/20. Wanting to know if a muscle relaxer could be called into [facility].

## 2020-04-24 NOTE — Telephone Encounter (Signed)
Medication sent in and patient is aware.  

## 2020-04-24 NOTE — Telephone Encounter (Signed)
Pt is scheduled for Tuesday with Dr. Nicki Reaper please remind pt they thought appt was set for Monday.

## 2020-04-26 NOTE — Telephone Encounter (Signed)
1.  Dr. Richardson Landry sent in muscle relaxer on Friday 2.  Please clarify with patient make sure he is aware his follow-up with [place] on Tuesday at 2 PM Please have patient bring all of his medicines with his visit

## 2020-04-27 ENCOUNTER — Inpatient Hospital Stay: Payer: Medicare Other | Admitting: Family Medicine

## 2020-04-27 NOTE — Telephone Encounter (Signed)
Pt notified of all.

## 2020-04-28 ENCOUNTER — Ambulatory Visit (INDEPENDENT_AMBULATORY_CARE_PROVIDER_SITE_OTHER): Payer: Medicare Other | Admitting: Family Medicine

## 2020-04-28 ENCOUNTER — Other Ambulatory Visit: Payer: Self-pay

## 2020-04-28 VITALS — BP 124/76

## 2020-04-28 DIAGNOSIS — Z7901 Long term (current) use of anticoagulants: Secondary | ICD-10-CM | POA: Diagnosis not present

## 2020-04-28 DIAGNOSIS — I639 Cerebral infarction, unspecified: Secondary | ICD-10-CM

## 2020-04-28 LAB — POCT INR: INR: 1.4 — AB (ref 2.0–3.0)

## 2020-04-28 MED ORDER — CYCLOBENZAPRINE HCL 5 MG PO TABS: ORAL_TABLET | ORAL | 1 refills | Status: DC

## 2020-04-28 NOTE — Progress Notes (Signed)
   Subjective:    Patient ID: David Dougherty, male    DOB: December 14, 1950, 69 y.o.   MRN: [id number]  HPIpt arrives with wife David Dougherty for a hospital follow up. No concerns today.  He is starting to take the Coumadin but did not take the increased dose of Dr. Richardson Landry stated therefore he will start that now and follow-up again in 10 days to repeat he was talked to regarding Coumadin and the risk of bleeding and what warning signs to watch for Results for orders placed or performed in visit on 04/28/20  POCT INR  Result Value Ref Range   INR 1.4 (A) 2.0 - 3.0    Fall Risk  11/21/2019 10/30/2019 07/05/2018 06/01/2015  Falls in the past year? 0 0 No No  Comment - Emmi Telephone Survey: data to providers prior to load Emmi Telephone Survey: data to providers prior to load -  Follow up Falls evaluation completed - - -     Review of Systems    He relates some left leg stiffness but denies any other particular problems denies any bleeding issues Objective:   Physical Exam No123 You may lungs clear respiratory rate normal heart irregular rate is controlled extremities no edema skin warm dry strength in both arms and legs with good neurologic grossly normal       Assessment & Plan:  Recent stroke gradually improving but having some neurologic deficits in the left leg he would like to utilize a muscle relaxer at nighttime to see if this will help relax the legs better  Coumadin he was not taking the adjusted dose he agrees to go ahead and take the adjusted dose we will recheck this again in 10 days

## 2020-04-28 NOTE — Patient Instructions (Signed)
Vitamin K Foods and Warfarin Warfarin is a blood thinner (anticoagulant). Anticoagulant medicines help prevent the formation of blood clots. These medicines work by decreasing the activity of vitamin K, which promotes normal blood clotting. When you take warfarin, problems can occur from suddenly increasing or decreasing the amount of vitamin K that you eat from one day to the next. Problems may include:  Blood clots.  Bleeding. What general guidelines do I need to follow? To avoid problems when taking warfarin:  Eat a balanced diet that includes: ? Fresh fruits and vegetables. ? Whole grains. ? Low-fat dairy products. ? Lean proteins, such as fish, eggs, and lean cuts of meat.  Keep your intake of vitamin K consistent from day to day. To do this: ? Avoid eating large amounts of vitamin K one day and low amounts of vitamin K the next day. ? If you take a multivitamin that contains vitamin K, be sure to take it every day. ? Know which foods contain vitamin K. Use the lists below to understand serving sizes and the amount of vitamin K in one serving.  Avoid major changes in your diet. If you are going to change your diet, talk with your health care provider before making changes.  Work with a nutrition specialist (dietitian) to develop a meal plan that works best for you.  High vitamin K foods Foods that are high in vitamin K contain more than 100 mcg (micrograms) per serving. These include:  Broccoli (cooked) -  cup has 110 mcg.  Brussels sprouts (cooked) -  cup has 109 mcg.  Greens, beet (cooked) -  cup has 350 mcg.  Greens, collard (cooked) -  cup has 418 mcg.  Greens, turnip (cooked) -  cup has 265 mcg.  Green onions or scallions -  cup has 105 mcg.  Kale (fresh or frozen) -  cup has 531 mcg.  Parsley (raw) - 10 sprigs has 164 mcg.  Spinach (cooked) -  cup has 444 mcg.  Swiss chard (cooked) -  cup has 287 mcg. Moderate vitamin K foods Foods that have a  moderate amount of vitamin K contain 25-100 mcg per serving. These include:  Asparagus (cooked) - 5 spears have 38 mcg.  Black-eyed peas (dried) -  cup has 32 mcg.  Cabbage (cooked) -  cup has 37 mcg.  Kiwi fruit - 1 medium has 31 mcg.  Lettuce - 1 cup has 57-63 mcg.  Okra (frozen) -  cup has 44 mcg.  Prunes (dried) - 5 prunes have 25 mcg.  Watercress (raw) - 1 cup has 85 mcg. Low vitamin K foods Foods low in vitamin K contain less than 25 mcg per serving. These include:  Artichoke - 1 medium has 18 mcg.  Avocado - 1 oz. has 6 mcg.  Blueberries -  cup has 14 mcg.  Cabbage (raw) -  cup has 21 mcg.  Carrots (cooked) -  cup has 11 mcg.  Cauliflower (raw) -  cup has 11 mcg.  Cucumber with peel (raw) -  cup has 9 mcg.  Grapes -  cup has 12 mcg.  Mango - 1 medium has 9 mcg.  Nuts - 1 oz. has 15 mcg.  Pear - 1 medium has 8 mcg.  Peas (cooked) -  cup has 19 mcg.  Pickles - 1 spear has 14 mcg.  Pumpkin seeds - 1 oz. has 13 mcg.  Sauerkraut (canned) -  cup has 16 mcg.  Soybeans (cooked) -  cup has 16 mcg.    Tomato (raw) - 1 medium has 10 mcg.  Tomato sauce -  cup has 17 mcg. Vitamin K-free foods If a food contain less than 5 mcg per serving, it is considered to have no vitamin K. These foods include:  Bread and cereal products.  Cheese.  Eggs.  Fish and shellfish.  Meat and poultry.  Milk and dairy products.  Sunflower seeds. Actual amounts of vitamin K in foods may be different depending on processing. Talk with your dietitian about what foods you can eat and what foods you should avoid. This information is not intended to replace advice given to you by your health care provider. Make sure you discuss any questions you have with your health care provider. Document Revised: 11/03/2017 Document Reviewed: 02/24/2016 Elsevier Patient Education  2020 [employer]  

## 2020-05-05 NOTE — Telephone Encounter (Signed)
error 

## 2020-05-07 ENCOUNTER — Other Ambulatory Visit: Payer: Self-pay | Admitting: *Deleted

## 2020-05-07 ENCOUNTER — Telehealth: Payer: Self-pay | Admitting: Family Medicine

## 2020-05-07 MED ORDER — WARFARIN SODIUM 5 MG PO TABS: ORAL_TABLET | ORAL | 0 refills | Status: DC

## 2020-05-07 NOTE — Telephone Encounter (Signed)
Pt is needing a refill on warfarin (COUMADIN) 5 MG tablet  Instructions are stating taking once a day but Dr. Nicki Reaper changed it to him taking once a day but on Wednesday and Saturday's taking two a day. He will be out of medication before next visit.   [facility] [place], [place] - [address]

## 2020-05-14 ENCOUNTER — Telehealth: Payer: Self-pay | Admitting: *Deleted

## 2020-05-14 ENCOUNTER — Other Ambulatory Visit: Payer: Self-pay | Admitting: *Deleted

## 2020-05-14 ENCOUNTER — Other Ambulatory Visit: Payer: Self-pay

## 2020-05-14 ENCOUNTER — Other Ambulatory Visit (INDEPENDENT_AMBULATORY_CARE_PROVIDER_SITE_OTHER): Payer: Medicare Other | Admitting: *Deleted

## 2020-05-14 DIAGNOSIS — Z7901 Long term (current) use of anticoagulants: Secondary | ICD-10-CM

## 2020-05-14 LAB — POCT INR: INR: 1.6 — AB (ref 2.0–3.0)

## 2020-05-14 MED ORDER — ROPINIROLE HCL 1 MG PO TABS: 1.0000 mg | ORAL_TABLET | Freq: Every day | ORAL | 0 refills | Status: DC

## 2020-05-14 NOTE — Telephone Encounter (Signed)
Recommend Requip 1 mg nightly, #30 patient needs to give [place] feedback in 2 weeks if this is helping  Patient does need to do clinical office visit follow-up as planned later this month  (Every time the patient lays down he feels a heaviness in his leg it gets worse causes him to have to get up he is unable to sleep because of it had recent stroke.  Concerning for the possibility of restless legs although there could be some other issues going on if this medication does not work we will try something different if he has significant side effects or problems he will let us know)

## 2020-05-14 NOTE — Telephone Encounter (Signed)
Would like med for restless legs.  [facility] [place].

## 2020-05-14 NOTE — Telephone Encounter (Signed)
Med sent to pharm. Left message to return call  

## 2020-05-14 NOTE — Patient Instructions (Signed)
Take coumadin 5mg  tablets. Take one tablet on sundays, tuesdays, thursdays and fridays and take 2 tablets on mondays, wednesdays and saturdays.

## 2020-05-14 NOTE — Telephone Encounter (Signed)
Pt.notified

## 2020-05-26 ENCOUNTER — Other Ambulatory Visit: Payer: Self-pay | Admitting: Family Medicine

## 2020-05-27 ENCOUNTER — Other Ambulatory Visit: Payer: Self-pay

## 2020-05-27 ENCOUNTER — Encounter: Payer: Self-pay | Admitting: Family Medicine

## 2020-05-27 ENCOUNTER — Ambulatory Visit (INDEPENDENT_AMBULATORY_CARE_PROVIDER_SITE_OTHER): Payer: Medicare Other | Admitting: Family Medicine

## 2020-05-27 VITALS — BP 120/80 | Temp 97.7°F | Wt 212.4 lb

## 2020-05-27 DIAGNOSIS — E785 Hyperlipidemia, unspecified: Secondary | ICD-10-CM

## 2020-05-27 DIAGNOSIS — I639 Cerebral infarction, unspecified: Secondary | ICD-10-CM

## 2020-05-27 DIAGNOSIS — Z7901 Long term (current) use of anticoagulants: Secondary | ICD-10-CM | POA: Diagnosis not present

## 2020-05-27 DIAGNOSIS — I480 Paroxysmal atrial fibrillation: Secondary | ICD-10-CM

## 2020-05-27 DIAGNOSIS — I10 Essential (primary) hypertension: Secondary | ICD-10-CM | POA: Diagnosis not present

## 2020-05-27 DIAGNOSIS — Z125 Encounter for screening for malignant neoplasm of prostate: Secondary | ICD-10-CM

## 2020-05-27 LAB — POCT INR: INR: 1.6 — AB (ref 2.0–3.0)

## 2020-05-27 MED ORDER — ATORVASTATIN CALCIUM 40 MG PO TABS: 40.0000 mg | ORAL_TABLET | Freq: Every day | ORAL | 1 refills | Status: DC

## 2020-05-27 MED ORDER — ROPINIROLE HCL 1 MG PO TABS: 1.0000 mg | ORAL_TABLET | Freq: Every day | ORAL | 1 refills | Status: DC

## 2020-05-27 MED ORDER — LOSARTAN POTASSIUM 25 MG PO TABS: 25.0000 mg | ORAL_TABLET | Freq: Every day | ORAL | 1 refills | Status: DC

## 2020-05-27 MED ORDER — WARFARIN SODIUM 5 MG PO TABS: ORAL_TABLET | ORAL | 5 refills | Status: DC

## 2020-05-27 MED ORDER — OMEPRAZOLE 20 MG PO CPDR: DELAYED_RELEASE_CAPSULE | ORAL | 1 refills | Status: DC

## 2020-05-27 MED ORDER — CARVEDILOL 3.125 MG PO TABS: 3.1250 mg | ORAL_TABLET | Freq: Two times a day (BID) | ORAL | 1 refills | Status: DC

## 2020-05-27 MED ORDER — FUROSEMIDE 20 MG PO TABS: 20.0000 mg | ORAL_TABLET | Freq: Every day | ORAL | 1 refills | Status: DC

## 2020-05-27 NOTE — Progress Notes (Signed)
   Subjective:    Patient ID: David Dougherty, male    DOB: 1951/11/25, 69 y.o.   MRN: [id number]  HPI Pt here today for follow up and INR. Pt has been having some left leg stiffness. Pt had stroke first of May.  David Dougherty comes in today for follow-up.  He is on Coumadin to lessen the risk of stroke.  He is recovering from previous stroke including left leg stiffness Was having some restless legs symptoms and was treated with Requip and had remarkable improvement with this. Patient states his energy level is okay dietary doing well taking his medicines as directed Results for orders placed or performed in visit on 05/27/20  POCT INR  Result Value Ref Range   INR 1.6 (A) 2.0 - 3.0     Review of Systems  Constitutional: Negative for diaphoresis and fatigue.  HENT: Negative for congestion and rhinorrhea.   Respiratory: Negative for cough and shortness of breath.   Cardiovascular: Negative for chest pain and leg swelling.  Gastrointestinal: Negative for abdominal pain and diarrhea.  Skin: Negative for color change and rash.  Neurological: Negative for dizziness and headaches.  Psychiatric/Behavioral: Negative for behavioral problems and confusion.       Objective:   Physical Exam Vitals reviewed.  Constitutional:      General: He is not in acute distress. HENT:     Head: Normocephalic and atraumatic.  Eyes:     General:        Right eye: No discharge.        Left eye: No discharge.  Neck:     Trachea: No tracheal deviation.  Cardiovascular:     Rate and Rhythm: Normal rate. Rhythm irregular.     Heart sounds: Normal heart sounds. No murmur heard.   Pulmonary:     Effort: Pulmonary effort is normal. No respiratory distress.     Breath sounds: Normal breath sounds.  Lymphadenopathy:     Cervical: No cervical adenopathy.  Skin:    General: Skin is warm and dry.  Neurological:     Mental Status: He is alert.     Coordination: Coordination normal.  Psychiatric:         Behavior: Behavior normal.           Assessment & Plan:  1. Encounter for current long-term use of anticoagulants INR 1.6 ideally need to be between 2-3.0 adjust medication recheck in 7 to 10 days - POCT INR  2. Paroxysmal atrial fibrillation (HCC) Heart rate control continue current measures  3. Essential hypertension Blood pressure good continue current measures - Basic metabolic panel Monitor kidney function and potassium 4. Hyperlipidemia, unspecified hyperlipidemia type Patient will be due lipid profile continue current measures - Lipid panel Dietary measures discussed in detail continue medication 5. Screening for prostate cancer Patient will be due PSA screening - PSA If all goes well follow-up office visit 3 to 4 months INR on a regular basis Warnings regarding falls were discussed in detail  Restless leg doing well with medication continue current medication

## 2020-05-28 ENCOUNTER — Other Ambulatory Visit: Payer: Medicare Other

## 2020-06-04 ENCOUNTER — Other Ambulatory Visit (INDEPENDENT_AMBULATORY_CARE_PROVIDER_SITE_OTHER): Payer: Medicare Other | Admitting: *Deleted

## 2020-06-04 ENCOUNTER — Other Ambulatory Visit: Payer: Self-pay

## 2020-06-04 DIAGNOSIS — Z7901 Long term (current) use of anticoagulants: Secondary | ICD-10-CM

## 2020-06-04 LAB — POCT INR: INR: 1.7 — AB (ref 2.0–3.0)

## 2020-06-04 NOTE — Patient Instructions (Addendum)
Change to coumadin 5 mg tablets and take one and a half tablets every day. Recheck INR in 7 days.

## 2020-06-22 DIAGNOSIS — Z125 Encounter for screening for malignant neoplasm of prostate: Secondary | ICD-10-CM | POA: Diagnosis not present

## 2020-06-22 DIAGNOSIS — E785 Hyperlipidemia, unspecified: Secondary | ICD-10-CM | POA: Diagnosis not present

## 2020-06-22 DIAGNOSIS — I10 Essential (primary) hypertension: Secondary | ICD-10-CM | POA: Diagnosis not present

## 2020-06-23 ENCOUNTER — Encounter: Payer: Self-pay | Admitting: Family Medicine

## 2020-06-23 LAB — BASIC METABOLIC PANEL
BUN/Creatinine Ratio: 19 (ref 10–24)
BUN: 19 mg/dL (ref 8–27)
CO2: 20 mmol/L (ref 20–29)
Calcium: 9.1 mg/dL (ref 8.6–10.2)
Chloride: 109 mmol/L — ABNORMAL HIGH (ref 96–106)
Creatinine, Ser: 1.01 mg/dL (ref 0.76–1.27)
GFR calc Af Amer: 87 mL/min/{1.73_m2} (ref >59)
GFR calc non Af Amer: 76 mL/min/{1.73_m2} (ref >59)
Glucose: 114 mg/dL — ABNORMAL HIGH (ref 65–99)
Potassium: 3.9 mmol/L (ref 3.5–5.2)
Sodium: 141 mmol/L (ref 134–144)

## 2020-06-23 LAB — LIPID PANEL
Chol/HDL Ratio: 2.9 ratio (ref 0.0–5.0)
Cholesterol, Total: 102 mg/dL (ref 100–199)
HDL: 35 mg/dL — ABNORMAL LOW (ref >39)
LDL Chol Calc (NIH): 49 mg/dL (ref 0–99)
Triglycerides: 93 mg/dL (ref 0–149)
VLDL Cholesterol Cal: 18 mg/dL (ref 5–40)

## 2020-06-23 LAB — PSA: Prostate Specific Ag, Serum: 3.6 ng/mL (ref 0.0–4.0)

## 2020-07-13 ENCOUNTER — Other Ambulatory Visit: Payer: Self-pay | Admitting: Family Medicine

## 2020-07-23 ENCOUNTER — Ambulatory Visit (INDEPENDENT_AMBULATORY_CARE_PROVIDER_SITE_OTHER): Payer: Medicare Other | Admitting: Family Medicine

## 2020-07-23 ENCOUNTER — Other Ambulatory Visit: Payer: Self-pay

## 2020-07-23 DIAGNOSIS — Z7901 Long term (current) use of anticoagulants: Secondary | ICD-10-CM | POA: Diagnosis not present

## 2020-07-23 DIAGNOSIS — I639 Cerebral infarction, unspecified: Secondary | ICD-10-CM

## 2020-07-23 LAB — POCT INR: INR: 1.6 — AB (ref 2.0–3.0)

## 2020-07-23 NOTE — Patient Instructions (Signed)
Coumadin 5mg  tablets. Take one and a half tablets on Sunday, Tuesday, Thursday, and saturdays and 2 tablets on mondays, wednesdays, and fridays. Recheck INR in 7 -10 day

## 2020-07-30 ENCOUNTER — Other Ambulatory Visit (INDEPENDENT_AMBULATORY_CARE_PROVIDER_SITE_OTHER): Payer: Medicare Other | Admitting: *Deleted

## 2020-07-30 ENCOUNTER — Other Ambulatory Visit: Payer: Self-pay

## 2020-07-30 DIAGNOSIS — Z7901 Long term (current) use of anticoagulants: Secondary | ICD-10-CM | POA: Diagnosis not present

## 2020-07-30 LAB — POCT INR: INR: 1.7 — AB (ref 2.0–3.0)

## 2020-08-06 ENCOUNTER — Other Ambulatory Visit: Payer: Self-pay

## 2020-08-06 ENCOUNTER — Encounter: Payer: Self-pay | Admitting: Family Medicine

## 2020-08-06 ENCOUNTER — Ambulatory Visit (INDEPENDENT_AMBULATORY_CARE_PROVIDER_SITE_OTHER): Payer: Medicare Other | Admitting: Family Medicine

## 2020-08-06 VITALS — BP 128/74 | HR 92 | Temp 98.1°F | Ht 73.0 in | Wt 206.0 lb

## 2020-08-06 DIAGNOSIS — I5022 Chronic systolic (congestive) heart failure: Secondary | ICD-10-CM

## 2020-08-06 DIAGNOSIS — I10 Essential (primary) hypertension: Secondary | ICD-10-CM | POA: Diagnosis not present

## 2020-08-06 DIAGNOSIS — I639 Cerebral infarction, unspecified: Secondary | ICD-10-CM | POA: Diagnosis not present

## 2020-08-06 DIAGNOSIS — Z7901 Long term (current) use of anticoagulants: Secondary | ICD-10-CM

## 2020-08-06 DIAGNOSIS — I480 Paroxysmal atrial fibrillation: Secondary | ICD-10-CM | POA: Diagnosis not present

## 2020-08-06 LAB — POCT INR: INR: 2 (ref 2.0–3.0)

## 2020-08-06 NOTE — Progress Notes (Signed)
   Subjective:    Patient ID: David Dougherty, male    DOB: [birth year], 69 y.o.   MRN: [id number]  Hyperlipidemia This is a chronic problem. Treatments tried: atorvastatin. There are no compliance problems.    Results for orders placed or performed in visit on 08/06/20  POCT INR  Result Value Ref Range   INR 2.0 2.0 - 3.0  This patient has had a previous stroke He does take his Coumadin on a regular basis He also takes his medications on a regular basis He denies any type of chest tightness pressure pain or shortness of breath States heart rate seemingly under decent control Denies any swelling in his legs States he takes his medicines on a regular basis Encounter for current long-term use of anticoagulants - Plan: POCT INR  Paroxysmal atrial fibrillation ([place])  Essential hypertension  Chronic systolic congestive heart failure ([place])     Review of Systems Please see above    Objective:   Physical Exam Heart irregular yet under good control abdomen no tenderness extremities no edema skin warm dry pulse normal BP good       Assessment & Plan:  1. Encounter for current long-term use of anticoagulants Coumadin overall doing well continue current measures no bleeding issues we did discuss diet he may consume some fruits and vegetables that have vitamin K as long as he is consistent about it - POCT INR  2. Paroxysmal atrial fibrillation (HCC) Atrial fib rate is under good control taken Coumadin reduce the risk of stroke  3. Essential hypertension Blood pressure good control continue current measures  4. Chronic systolic congestive heart failure (HCC) Heart failure under good control.  No sign of overt failure currently continue medication

## 2020-08-06 NOTE — Patient Instructions (Addendum)
Bleeding Precautions When on Anticoagulant Therapy, Adult Anticoagulant therapy, also called blood thinner therapy, is medicine that helps to prevent and treat blood clots. The medicine works by stopping blood clots from forming or growing. Blood clots that form in your blood vessels can be dangerous. They can break loose and travel to the heart, lungs, or brain. This increases the risk of a heart attack, stroke, or blocked lung artery (pulmonary embolism). Anticoagulants also increase the risk of bleeding. Try to protect yourself from cuts and other injuries that can cause bleeding. It is important to take anticoagulants exactly as told by your health care provider. Why do I need to be on anticoagulant therapy? You may need this medicine if you are at risk of developing a blood clot. Conditions that increase your risk of a blood clot include:  Being born with heart disease or a heart malformation (congenital heart disease).  Developing heart disease.  Having had surgery, such as valve replacement.  Having had a serious accident or other type of severe injury (trauma).  Having certain types of cancer.  Having certain diseases that can increase blood clotting.  Having a high risk of stroke or heart attack.  Having atrial fibrillation (AF). What are the common anticoagulant medicines? There are several types of anticoagulant medicines. The most common types are:  Medicines that you take by mouth (oral medicines), such as: ? Warfarin. ? Novel oral anticoagulants (NOACs), such as:  Direct thrombin inhibitors (dabigatran).  Factor Xa inhibitors (apixaban, edoxaban, and rivaroxaban).  Injections, such as: ? Unfractionated heparin. ? Low molecular weight heparin. These anticoagulants work in different ways to prevent blood clots. They also have different risks and side effects. What do I need to remember while on anticoagulant therapy? Taking anticoagulants  Take your medicine at the  same time every day. If you forget to take your medicine, take it as soon as you remember. Do not double your dosage of medicine if you miss a whole day. Take your normal dose and call your health care provider.  Do not stop taking your medicine unless your health care provider approves. Stopping the medicine can increase your risk of developing a blood clot. Taking other medicines  Take over-the-counter and prescriptions medicines only as told by your health care provider.  Do not take over-the-counter NSAIDs, including aspirin and ibuprofen, while you are on anticoagulant therapy. These medicines increase your risk of dangerous bleeding.  Get approval from your health care provider before you start taking any new medicines, vitamins, or herbal products. Some of these could interfere with your therapy. General instructions  Keep all follow-up visits as told by your health care provider. This is important.  If you are pregnant or trying to get pregnant, talk with a health care provider about anticoagulants. Some of these medicines are not safe to take during pregnancy.  Tell all health care providers, including your dentist, that you are on anticoagulant therapy. It is especially important to tell providers before you have any surgery, medical procedures, or dental work done. What precautions should I take?   Be very careful when using knives, scissors, or other sharp objects.  Use an electric razor instead of a blade.  Do not use toothpicks.  Use a soft-bristled toothbrush. Brush your teeth gently.  Always wear shoes outdoors and wear slippers indoors.  Be careful when cutting your fingernails and toenails.  Place bath mats in the bathroom. If possible, install handrails as well.  Wear gloves while you do  yard work.  Wear your seat belt.  Prevent falls by removing loose rugs and extension cords from areas where you walk. Use a cane or walker if you need it.  Avoid  constipation by: ? Drinking enough fluid to keep your urine clear or pale yellow. ? Eating foods that are high in fiber, such as fresh fruits and vegetables, whole grains, and beans. ? Limiting foods that are high in fat and processed sugars, such as fried and sweet foods.  Do not play contact sports or participate in other activities that have a high risk for injury. What other precautions are important if on warfarin therapy? If you are taking a type of anticoagulant called warfarin, make sure you:  Work with a diet and nutrition specialist (dietitian) to make an eating plan. Do not make any sudden changes to your diet after you have started your eating plan.  Do not drink alcohol. It can interfere with your medicine and increase your risk of an injury that causes bleeding.  Get regular blood tests as told by your health care provider. What are some questions to ask my health care provider?  Why do I need anticoagulant therapy?  What is the best anticoagulant therapy for my condition?  How long will I need anticoagulant therapy?  What are the side effects of anticoagulant therapy?  When should I take my medicine? What should I do if I forget to take it?  Will I need to have regular blood tests?  Do I need to change my diet? Are there foods or drinks that I should avoid?  What activities are safe for me?  What should I do if I want to get pregnant? Contact a health care provider if:  You miss a dose of medicine: ? And you are not sure what to do. ? For more than one day.  You have: ? Menstrual bleeding that is heavier than normal. ? Bloody or brown urine. ? Easy bruising. ? Black and tarry stool or bright red stool. ? Side effects from your medicine.  You feel weak or dizzy.  You become pregnant. Get help right away if:  You have bleeding that will not stop within 20 minutes from: ? The nose. ? The gums. ? A cut on the skin.  You have a severe headache or  stomachache.  You vomit or cough up blood.  You fall or hit your head. Summary  Anticoagulant therapy, also called blood thinner therapy, is medicine that helps to prevent and treat blood clots.  Anticoagulants work in different ways to prevent blood clots. They also have different risks and side effects.  Talk with your health care provider about any precautions that you should take while on anticoagulant therapy. This information is not intended to replace advice given to you by your health care provider. Make sure you discuss any questions you have with your health care provider. Document Revised: 03/13/2019 Document Reviewed: 02/07/2017 Elsevier Patient Education  [place] DASH stands for "Dietary Approaches to Stop Hypertension." The DASH eating plan is a healthy eating plan that has been shown to reduce high blood pressure (hypertension). It may also reduce your risk for type 2 diabetes, heart disease, and stroke. The DASH eating plan may also help with weight loss. What are tips for following this plan?  General guidelines  Avoid eating more than 2,300 mg (milligrams) of salt (sodium) a day. If you have hypertension, you may need to reduce your sodium  intake to 1,500 mg a day.  Limit alcohol intake to no more than 1 drink a day for nonpregnant women and 2 drinks a day for men. One drink equals 12 oz of beer, 5 oz of wine, or 1 oz of hard liquor.  Work with your health care provider to maintain a healthy body weight or to lose weight. Ask what an ideal weight is for you.  Get at least 30 minutes of exercise that causes your heart to beat faster (aerobic exercise) most days of the week. Activities may include walking, swimming, or biking.  Work with your health care provider or diet and nutrition specialist (dietitian) to adjust your eating plan to your individual calorie needs. Reading food labels   Check food labels for the amount of sodium per  serving. Choose foods with less than 5 percent of the Daily Value of sodium. Generally, foods with less than 300 mg of sodium per serving fit into this eating plan.  To find whole grains, look for the word "whole" as the first word in the ingredient list. Shopping  Buy products labeled as "low-sodium" or "no salt added."  Buy fresh foods. Avoid canned foods and premade or frozen meals. Cooking  Avoid adding salt when cooking. Use salt-free seasonings or herbs instead of table salt or sea salt. Check with your health care provider or pharmacist before using salt substitutes.  Do not fry foods. Cook foods using healthy methods such as baking, boiling, grilling, and broiling instead.  Cook with heart-healthy oils, such as olive, canola, soybean, or sunflower oil. Meal planning  Eat a balanced diet that includes: ? 5 or more servings of fruits and vegetables each day. At each meal, try to fill half of your plate with fruits and vegetables. ? Up to 6-8 servings of whole grains each day. ? Less than 6 oz of lean meat, poultry, or fish each day. A 3-oz serving of meat is about the same size as a deck of cards. One egg equals 1 oz. ? 2 servings of low-fat dairy each day. ? A serving of nuts, seeds, or beans 5 times each week. ? Heart-healthy fats. Healthy fats called Omega-3 fatty acids are found in foods such as flaxseeds and coldwater fish, like sardines, salmon, and mackerel.  Limit how much you eat of the following: ? Canned or prepackaged foods. ? Food that is high in trans fat, such as fried foods. ? Food that is high in saturated fat, such as fatty meat. ? Sweets, desserts, sugary drinks, and other foods with added sugar. ? Full-fat dairy products.  Do not salt foods before eating.  Try to eat at least 2 vegetarian meals each week.  Eat more home-cooked food and less restaurant, buffet, and fast food.  When eating at a restaurant, ask that your food be prepared with less salt or  no salt, if possible. What foods are recommended? The items listed may not be a complete list. Talk with your dietitian about what dietary choices are best for you. Grains Whole-grain or whole-wheat bread. Whole-grain or whole-wheat pasta. Brown rice. [place]. Bulgur. Whole-grain and low-sodium cereals. Pita bread. Low-fat, low-sodium crackers. Whole-wheat flour tortillas. Vegetables Fresh or frozen vegetables (raw, steamed, roasted, or grilled). Low-sodium or reduced-sodium tomato and vegetable juice. Low-sodium or reduced-sodium tomato sauce and tomato paste. Low-sodium or reduced-sodium canned vegetables. Fruits All fresh, dried, or frozen fruit. Canned fruit in natural juice (without added sugar). Meat and other protein foods Skinless chicken or [place].  Ground chicken or [place]. Pork with fat trimmed off. Fish and seafood. Egg whites. Dried beans, peas, or lentils. Unsalted nuts, nut butters, and seeds. Unsalted canned beans. Lean cuts of beef with fat trimmed off. Low-sodium, lean deli meat. Dairy Low-fat (1%) or fat-free (skim) milk. Fat-free, low-fat, or reduced-fat cheeses. Nonfat, low-sodium ricotta or cottage cheese. Low-fat or nonfat yogurt. Low-fat, low-sodium cheese. Fats and oils Soft margarine without trans fats. Vegetable oil. Low-fat, reduced-fat, or light mayonnaise and salad dressings (reduced-sodium). Canola, safflower, olive, soybean, and sunflower oils. Avocado. Seasoning and other foods Herbs. Spices. Seasoning mixes without salt. Unsalted popcorn and pretzels. Fat-free sweets. What foods are not recommended? The items listed may not be a complete list. Talk with your dietitian about what dietary choices are best for you. Grains Baked goods made with fat, such as croissants, muffins, or some breads. Dry pasta or rice meal packs. Vegetables Creamed or fried vegetables. Vegetables in a cheese sauce. Regular canned vegetables (not low-sodium or reduced-sodium).  Regular canned tomato sauce and paste (not low-sodium or reduced-sodium). Regular tomato and vegetable juice (not low-sodium or reduced-sodium). Angie Fava. Olives. Fruits Canned fruit in a light or heavy syrup. Fried fruit. Fruit in cream or butter sauce. Meat and other protein foods Fatty cuts of meat. Ribs. Fried meat. Berniece Salines. Sausage. Bologna and other processed lunch meats. Salami. Fatback. Hotdogs. Bratwurst. Salted nuts and seeds. Canned beans with added salt. Canned or smoked fish. Whole eggs or egg yolks. Chicken or [place] with skin. Dairy Whole or 2% milk, cream, and half-and-half. Whole or full-fat cream cheese. Whole-fat or sweetened yogurt. Full-fat cheese. Nondairy creamers. Whipped toppings. Processed cheese and cheese spreads. Fats and oils Butter. Stick margarine. Lard. Shortening. Ghee. Bacon fat. Tropical oils, such as coconut, palm kernel, or palm oil. Seasoning and other foods Salted popcorn and pretzels. Onion salt, garlic salt, seasoned salt, table salt, and sea salt. Worcestershire sauce. Tartar sauce. Barbecue sauce. Teriyaki sauce. Soy sauce, including reduced-sodium. Steak sauce. Canned and packaged gravies. Fish sauce. Oyster sauce. Cocktail sauce. Horseradish that you find on the shelf. Ketchup. Mustard. Meat flavorings and tenderizers. Bouillon cubes. Hot sauce and Tabasco sauce. Premade or packaged marinades. Premade or packaged taco seasonings. Relishes. Regular salad dressings. Where to find more information:  National Heart, Lung, and Energy: https://wilson-eaton.com/  American Heart Association: www.heart.org Summary  The DASH eating plan is a healthy eating plan that has been shown to reduce high blood pressure (hypertension). It may also reduce your risk for type 2 diabetes, heart disease, and stroke.  With the DASH eating plan, you should limit salt (sodium) intake to 2,300 mg a day. If you have hypertension, you may need to reduce your sodium intake to 1,500 mg  a day.  When on the DASH eating plan, aim to eat more fresh fruits and vegetables, whole grains, lean proteins, low-fat dairy, and heart-healthy fats.  Work with your health care provider or diet and nutrition specialist (dietitian) to adjust your eating plan to your individual calorie needs. This information is not intended to replace advice given to you by your health care provider. Make sure you discuss any questions you have with your health care provider. Document Revised: 11/03/2017 Document Reviewed: 11/14/2016 Elsevier Patient Education  2020 [employer].

## 2020-08-11 ENCOUNTER — Other Ambulatory Visit: Payer: Self-pay | Admitting: Family Medicine

## 2020-08-14 ENCOUNTER — Telehealth: Payer: Self-pay | Admitting: Family Medicine

## 2020-08-14 NOTE — Telephone Encounter (Signed)
Patient called to let you know the medication he couldn't remember at his appointment on 9/2 is atorvastatin 40  Mg .

## 2020-08-16 NOTE — Telephone Encounter (Signed)
As long as the medication is not causing him problems I would recommend for him to continue taking this.  If it is causing him problems we could potentially switch to something different

## 2020-08-17 NOTE — Telephone Encounter (Signed)
Left message to return call 

## 2020-08-17 NOTE — Telephone Encounter (Signed)
Patient stated he wasn't having any problems with the med and will take as advised and let us know if he has any problems.

## 2020-08-27 ENCOUNTER — Ambulatory Visit: Payer: Medicare Other | Admitting: Family Medicine

## 2020-08-31 ENCOUNTER — Other Ambulatory Visit: Payer: Self-pay | Admitting: Family Medicine

## 2020-09-03 ENCOUNTER — Other Ambulatory Visit: Payer: Self-pay

## 2020-09-03 ENCOUNTER — Other Ambulatory Visit (INDEPENDENT_AMBULATORY_CARE_PROVIDER_SITE_OTHER): Payer: Medicare Other | Admitting: *Deleted

## 2020-09-03 DIAGNOSIS — Z23 Encounter for immunization: Secondary | ICD-10-CM

## 2020-09-03 DIAGNOSIS — Z7901 Long term (current) use of anticoagulants: Secondary | ICD-10-CM | POA: Diagnosis not present

## 2020-09-03 LAB — POCT INR: INR: 2.1 (ref 2.0–3.0)

## 2020-10-01 ENCOUNTER — Other Ambulatory Visit: Payer: Self-pay

## 2020-10-01 ENCOUNTER — Other Ambulatory Visit (INDEPENDENT_AMBULATORY_CARE_PROVIDER_SITE_OTHER): Payer: Medicare Other | Admitting: *Deleted

## 2020-10-01 DIAGNOSIS — Z7901 Long term (current) use of anticoagulants: Secondary | ICD-10-CM | POA: Diagnosis not present

## 2020-10-01 LAB — POCT INR: INR: 1.8 — AB (ref 2.0–3.0)

## 2020-10-20 ENCOUNTER — Other Ambulatory Visit: Payer: Medicare Other

## 2020-10-22 ENCOUNTER — Other Ambulatory Visit: Payer: Self-pay

## 2020-10-22 ENCOUNTER — Encounter: Payer: Self-pay | Admitting: Family Medicine

## 2020-10-22 ENCOUNTER — Ambulatory Visit (INDEPENDENT_AMBULATORY_CARE_PROVIDER_SITE_OTHER): Payer: Medicare Other | Admitting: Family Medicine

## 2020-10-22 VITALS — HR 88 | Temp 98.1°F | Resp 16

## 2020-10-22 DIAGNOSIS — Z20822 Contact with and (suspected) exposure to covid-19: Secondary | ICD-10-CM | POA: Diagnosis not present

## 2020-10-22 DIAGNOSIS — R059 Cough, unspecified: Secondary | ICD-10-CM | POA: Diagnosis not present

## 2020-10-22 NOTE — Progress Notes (Signed)
Patient ID: David Dougherty, male    DOB: 02-23-1951, 69 y.o.   MRN: [id number]   Chief Complaint  Patient presents with  . Cough    congestion- is almost completely gone- feels fine now-wife positive for Covid   Subjective:  CC: here to get Covid test, wife positive  Presents today for an outside visit due to wife testing positive for Covid.  Reports that he has no symptoms currently.  Reports that he got a flu shot 2 weeks ago and he gets his usual "cold "from his flu shot.  This is now resolved.  2 weeks ago he tried NyQuil and Tylenol for his symptoms.  Denies fever, chills, headaches, chest pain, shortness of breath, congestion, cough, sore throat.  Continues to be adamant that his symptoms were from his flu shot 2 weeks ago and that he does not have Covid.  Agrees to be tested today.    Medical History Davidmichael has a past medical history of Atrial fibrillation ([place]), CAD (coronary artery disease), Dysrhythmia, GERD (gastroesophageal reflux disease), Nonischemic cardiomyopathy ([place]), and Tobacco abuse.   Outpatient Encounter Medications as of 10/22/2020  Medication Sig  . Acetaminophen (TYLENOL PO) Take by mouth.  Marland Kitchen atorvastatin (LIPITOR) 40 MG tablet Take 1 tablet (40 mg total) by mouth daily.  . calcium carbonate (TUMS - DOSED IN MG ELEMENTAL CALCIUM) 500 MG chewable tablet Chew 2-3 tablets by mouth as needed for indigestion or heartburn.   . carvedilol (COREG) 3.125 MG tablet Take 1 tablet (3.125 mg total) by mouth 2 (two) times daily.  . Cholecalciferol (VITAMIN D-3) 125 MCG (5000 UT) TABS Take by mouth.  . cyclobenzaprine (FLEXERIL) 5 MG tablet 1 qhs prn  . furosemide (LASIX) 20 MG tablet Take 1 tablet by mouth once daily  . losartan (COZAAR) 25 MG tablet Take 1 tablet by mouth once daily  . nitroGLYCERIN (NITROSTAT) 0.4 MG SL tablet PLACE 1 TAB UNDER TONGUE EVERY 5 MIN IF NEEDED FOR CHEST PAIN. MAY USE 3 TIMES.NO RELIEF CALL 911. (Patient taking differently: Place 0.4  mg under the tongue every 5 (five) minutes as needed for chest pain. )  . omeprazole (PRILOSEC) 20 MG capsule TAKE 1 CAPSULE BY MOUTH ONCE DAILY 30  MINUTES  PRIOR  TO  BREAKFAST  . oxymetazoline (AFRIN) 0.05 % nasal spray Place 1 spray into both nostrils 2 (two) times daily as needed for congestion.  Marland Kitchen rOPINIRole (REQUIP) 1 MG tablet Take 1 tablet (1 mg total) by mouth at bedtime.  Marland Kitchen tiZANidine (ZANAFLEX) 4 MG tablet Take one tablet po qhs prn sleep/muscle spasms  . topiramate (TOPAMAX) 50 MG tablet Take 1 tablet by mouth twice daily  . warfarin (COUMADIN) 5 MG tablet Take as directed one to two tablets per day based on INR   No facility-administered encounter medications on file as of 10/22/2020.     Review of Systems  Constitutional: Negative for chills and fever.  HENT: Negative for congestion, ear pain and sore throat.   Respiratory: Negative for shortness of breath.   Cardiovascular: Negative for chest pain.  Gastrointestinal: Negative for abdominal pain.  Musculoskeletal: Negative for myalgias.  Neurological: Negative for headaches.     Vitals Pulse 88   Temp 98.1 F (36.7 C)   Resp 16   SpO2 97%   Objective:   Physical Exam Vitals reviewed.  Constitutional:      General: He is not in acute distress.    Appearance: Normal appearance. He is not ill-appearing.  HENT:     Right Ear: Tympanic membrane normal.     Left Ear: Tympanic membrane normal.     Nose: Nose normal.     Mouth/Throat:     Mouth: Mucous membranes are moist.     Pharynx: Oropharynx is clear.  Cardiovascular:     Rate and Rhythm: Regular rhythm.     Heart sounds: Normal heart sounds.  Pulmonary:     Effort: Pulmonary effort is normal.     Breath sounds: Normal breath sounds.  Skin:    General: Skin is warm and dry.  Neurological:     General: No focal deficit present.     Mental Status: He is alert and oriented to person, place, and time.  Psychiatric:        Mood and Affect: Mood normal.         Behavior: Behavior normal.        Thought Content: Thought content normal.        Judgment: Judgment normal.      Assessment and Plan   1. Cough - Novel Coronavirus, NAA ([facility])   It is possible, that the "cold "he experienced 2 weeks ago was Covid infection.  Reports the symptoms have resolved currently, 2 weeks ago he tried NyQuil and Tylenol for his symptoms.  He is adamant that he has no symptoms currently he is only being tested because his wife is positive for Covid.  Agrees with plan of care discussed today. Understands warning signs to seek further care: Fever, chills, chest pain, shortness of breath, any significant changes in health status. Understands to follow-up if you develop any symptoms, shortness of breath chest pain let us know.  We will notify you in 1 to 2 days when the Covid results become available.  Instructed to quarantine until these results are available.  Pecolia Ades, FNP-C

## 2020-10-24 LAB — SARS-COV-2, NAA 2 DAY TAT

## 2020-10-24 LAB — NOVEL CORONAVIRUS, NAA: SARS-CoV-2, NAA: DETECTED — AB

## 2020-10-24 NOTE — Progress Notes (Signed)
This was a nurse visit for INR.  No Dr. Duffy Bruce was completed for this.  Other than reviewing over the INR and giving advice regarding Coumadin.

## 2020-10-27 ENCOUNTER — Other Ambulatory Visit (INDEPENDENT_AMBULATORY_CARE_PROVIDER_SITE_OTHER): Payer: Medicare Other | Admitting: *Deleted

## 2020-10-27 ENCOUNTER — Other Ambulatory Visit: Payer: Self-pay

## 2020-10-27 ENCOUNTER — Other Ambulatory Visit: Payer: Medicare Other

## 2020-10-27 DIAGNOSIS — Z7901 Long term (current) use of anticoagulants: Secondary | ICD-10-CM | POA: Diagnosis not present

## 2020-10-27 LAB — POCT INR: INR: 2.6 (ref 2.0–3.0)

## 2020-11-05 ENCOUNTER — Ambulatory Visit: Payer: Medicare Other | Admitting: Family Medicine

## 2020-11-09 ENCOUNTER — Other Ambulatory Visit: Payer: Self-pay | Admitting: Family Medicine

## 2020-11-12 ENCOUNTER — Encounter: Payer: Self-pay | Admitting: Family Medicine

## 2020-11-12 ENCOUNTER — Ambulatory Visit (INDEPENDENT_AMBULATORY_CARE_PROVIDER_SITE_OTHER): Payer: Medicare Other | Admitting: Family Medicine

## 2020-11-12 ENCOUNTER — Other Ambulatory Visit: Payer: Self-pay

## 2020-11-12 VITALS — BP 138/88 | HR 79 | Temp 97.3°F | Wt 212.6 lb

## 2020-11-12 DIAGNOSIS — M79605 Pain in left leg: Secondary | ICD-10-CM | POA: Diagnosis not present

## 2020-11-12 DIAGNOSIS — Z23 Encounter for immunization: Secondary | ICD-10-CM

## 2020-11-12 DIAGNOSIS — I429 Cardiomyopathy, unspecified: Secondary | ICD-10-CM

## 2020-11-12 DIAGNOSIS — I639 Cerebral infarction, unspecified: Secondary | ICD-10-CM | POA: Diagnosis not present

## 2020-11-12 DIAGNOSIS — M5442 Lumbago with sciatica, left side: Secondary | ICD-10-CM

## 2020-11-12 DIAGNOSIS — I480 Paroxysmal atrial fibrillation: Secondary | ICD-10-CM

## 2020-11-12 NOTE — Progress Notes (Signed)
   Subjective:    Patient ID: David Dougherty, male    DOB: Mar 09, 1951, 69 y.o.   MRN: [id number]  Hyperlipidemia This is a chronic problem. Pertinent negatives include no chest pain or shortness of breath. There are no compliance problems.  Risk factors for coronary artery disease include hypertension.  Hypertension This is a chronic problem. Pertinent negatives include no chest pain, headaches or shortness of breath. Risk factors for coronary artery disease include male gender. There are no compliance problems.    Pt wanting to know how many Vit D tablets to take.  The patient will find out what vitamin D he has at home I recommended 1000 units maximum 3000 units daily   Review of Systems  Constitutional: Negative for activity change.  HENT: Negative for congestion and rhinorrhea.   Respiratory: Negative for cough and shortness of breath.   Cardiovascular: Negative for chest pain.  Gastrointestinal: Negative for abdominal pain, diarrhea, nausea and vomiting.  Genitourinary: Negative for dysuria and hematuria.  Neurological: Negative for weakness and headaches.  Psychiatric/Behavioral: Negative for behavioral problems and confusion.       Objective:   Physical Exam Vitals reviewed.  Constitutional:      Appearance: He is well-nourished.  Cardiovascular:     Rate and Rhythm: Normal rate.     Heart sounds: Normal heart sounds. No murmur heard.   Pulmonary:     Effort: Pulmonary effort is normal.     Breath sounds: Normal breath sounds.  Musculoskeletal:        General: No edema.  Lymphadenopathy:     Cervical: No cervical adenopathy.  Neurological:     Mental Status: He is alert.  Psychiatric:        Behavior: Behavior normal.    Irregular heart but rate is controlled       Assessment & Plan:  1. Acute left-sided low back pain with left-sided sciatica Back pain and discomfort stretching exercises shown.  X-ray of the hip and lumbar area because of the  persistence of this over the past several weeks - DG Lumbar Spine Complete - DG Hip Unilat W OR W/O Pelvis Min 4 Views Left  2. Left leg pain X-ray ordered await the results - DG Lumbar Spine Complete - DG Hip Unilat W OR W/O Pelvis Min 4 Views Left  3. Paroxysmal atrial fibrillation ([facility]) Heart rate is controlled no sign of failure going Continue current measures.  4. Secondary cardiomyopathy ([place]) He does have cardiomyopathy does get short of breath with activity but no chest pressure currently  5. Need for vaccination Vaccine given. - Pneumococcal polysaccharide vaccine 23-valent greater than or equal to 2yo subcutaneous/IM I have encouraged the patient to get his booster for Covid

## 2020-11-30 ENCOUNTER — Other Ambulatory Visit: Payer: Self-pay | Admitting: Family Medicine

## 2020-12-07 ENCOUNTER — Other Ambulatory Visit: Payer: Self-pay | Admitting: Family Medicine

## 2020-12-11 ENCOUNTER — Telehealth: Payer: Self-pay | Admitting: Family Medicine

## 2020-12-11 NOTE — Telephone Encounter (Signed)
Nurses Next week to connect with patient According to records it looks like the patient is due for INR? Please set up patient for nurse visit for INR somewhere later in January

## 2020-12-13 ENCOUNTER — Other Ambulatory Visit: Payer: Self-pay | Admitting: Family Medicine

## 2020-12-14 ENCOUNTER — Telehealth: Payer: Self-pay

## 2020-12-14 NOTE — Telephone Encounter (Signed)
Patient scheduled nurse visit for INR 12/22/2020

## 2020-12-14 NOTE — Telephone Encounter (Signed)
Left message to return call 

## 2020-12-14 NOTE — Telephone Encounter (Signed)
Patient calling to check on medication request sent in over the weekend.  I told patient I could see the request in the system but today was the first day we would have seen it.  Patient said he is out of medication.

## 2020-12-14 NOTE — Telephone Encounter (Signed)
Refills sent to pharm per protocol and pt was notified.  

## 2020-12-22 ENCOUNTER — Other Ambulatory Visit: Payer: Self-pay

## 2020-12-22 ENCOUNTER — Other Ambulatory Visit (INDEPENDENT_AMBULATORY_CARE_PROVIDER_SITE_OTHER): Payer: Medicare Other | Admitting: *Deleted

## 2020-12-22 ENCOUNTER — Other Ambulatory Visit: Payer: Self-pay | Admitting: Family Medicine

## 2020-12-22 DIAGNOSIS — Z7901 Long term (current) use of anticoagulants: Secondary | ICD-10-CM

## 2020-12-22 LAB — POCT INR: INR: 2 (ref 2.0–3.0)

## 2020-12-27 ENCOUNTER — Other Ambulatory Visit: Payer: Self-pay | Admitting: Family Medicine

## 2020-12-28 MED ORDER — WARFARIN SODIUM 5 MG PO TABS: ORAL_TABLET | ORAL | 1 refills | Status: DC

## 2020-12-28 NOTE — Addendum Note (Signed)
Addended by: Patsy Lager on: 12/28/2020 02:06 PM   Modules accepted: Orders

## 2020-12-30 ENCOUNTER — Other Ambulatory Visit: Payer: Self-pay | Admitting: Family Medicine

## 2021-01-19 ENCOUNTER — Other Ambulatory Visit: Payer: Medicare Other

## 2021-01-22 ENCOUNTER — Other Ambulatory Visit: Payer: Self-pay | Admitting: Family Medicine

## 2021-02-02 ENCOUNTER — Other Ambulatory Visit (INDEPENDENT_AMBULATORY_CARE_PROVIDER_SITE_OTHER): Payer: Medicare Other | Admitting: *Deleted

## 2021-02-02 ENCOUNTER — Other Ambulatory Visit: Payer: Self-pay

## 2021-02-02 ENCOUNTER — Telehealth: Payer: Self-pay | Admitting: *Deleted

## 2021-02-02 DIAGNOSIS — Z7901 Long term (current) use of anticoagulants: Secondary | ICD-10-CM | POA: Diagnosis not present

## 2021-02-02 LAB — POCT INR: INR: 1.8 — AB (ref 2.0–3.0)

## 2021-02-02 NOTE — Telephone Encounter (Signed)
Fyi see result note

## 2021-02-02 NOTE — Telephone Encounter (Signed)
Pt's INR is 1.8 today. No missed or extra doses. No bleeding. Takes coumadin 5mg  tablets - Takes 2 tablets on mon, tues, wed, and fridays and one and a half tablets on sund, thurs, and sat.

## 2021-02-02 NOTE — Telephone Encounter (Signed)
done

## 2021-02-22 ENCOUNTER — Other Ambulatory Visit: Payer: Self-pay | Admitting: Family Medicine

## 2021-03-02 ENCOUNTER — Other Ambulatory Visit: Payer: Self-pay

## 2021-03-02 ENCOUNTER — Other Ambulatory Visit (INDEPENDENT_AMBULATORY_CARE_PROVIDER_SITE_OTHER): Payer: Medicare Other | Admitting: *Deleted

## 2021-03-02 DIAGNOSIS — Z7901 Long term (current) use of anticoagulants: Secondary | ICD-10-CM | POA: Diagnosis not present

## 2021-03-02 LAB — POCT INR: INR: 2 (ref 2.0–3.0)

## 2021-03-15 ENCOUNTER — Other Ambulatory Visit: Payer: Self-pay | Admitting: Family Medicine

## 2021-03-17 ENCOUNTER — Ambulatory Visit: Payer: Medicare Other | Admitting: Family Medicine

## 2021-03-23 ENCOUNTER — Other Ambulatory Visit: Payer: Self-pay | Admitting: Family Medicine

## 2021-03-24 ENCOUNTER — Ambulatory Visit (INDEPENDENT_AMBULATORY_CARE_PROVIDER_SITE_OTHER): Payer: Medicare Other | Admitting: Family Medicine

## 2021-03-24 ENCOUNTER — Encounter: Payer: Self-pay | Admitting: Family Medicine

## 2021-03-24 ENCOUNTER — Other Ambulatory Visit: Payer: Self-pay

## 2021-03-24 VITALS — BP 128/76 | HR 65 | Temp 97.8°F | Ht 73.0 in | Wt 214.0 lb

## 2021-03-24 DIAGNOSIS — E7849 Other hyperlipidemia: Secondary | ICD-10-CM | POA: Diagnosis not present

## 2021-03-24 DIAGNOSIS — Z125 Encounter for screening for malignant neoplasm of prostate: Secondary | ICD-10-CM

## 2021-03-24 DIAGNOSIS — I25118 Atherosclerotic heart disease of native coronary artery with other forms of angina pectoris: Secondary | ICD-10-CM

## 2021-03-24 DIAGNOSIS — I429 Cardiomyopathy, unspecified: Secondary | ICD-10-CM

## 2021-03-24 DIAGNOSIS — I480 Paroxysmal atrial fibrillation: Secondary | ICD-10-CM

## 2021-03-24 DIAGNOSIS — E559 Vitamin D deficiency, unspecified: Secondary | ICD-10-CM

## 2021-03-24 DIAGNOSIS — I10 Essential (primary) hypertension: Secondary | ICD-10-CM

## 2021-03-24 DIAGNOSIS — R972 Elevated prostate specific antigen [PSA]: Secondary | ICD-10-CM

## 2021-03-24 DIAGNOSIS — H6983 Other specified disorders of Eustachian tube, bilateral: Secondary | ICD-10-CM

## 2021-03-24 MED ORDER — ROPINIROLE HCL 1 MG PO TABS: 1.0000 mg | ORAL_TABLET | Freq: Every day | ORAL | 1 refills | Status: DC

## 2021-03-24 MED ORDER — LOSARTAN POTASSIUM 25 MG PO TABS: 25.0000 mg | ORAL_TABLET | Freq: Every day | ORAL | 1 refills | Status: DC

## 2021-03-24 MED ORDER — FLUTICASONE PROPIONATE 50 MCG/ACT NA SUSP: 2.0000 | Freq: Every day | NASAL | 5 refills | Status: AC

## 2021-03-24 MED ORDER — ATORVASTATIN CALCIUM 40 MG PO TABS: 1.0000 | ORAL_TABLET | Freq: Every day | ORAL | 1 refills | Status: DC

## 2021-03-24 MED ORDER — TOPIRAMATE 50 MG PO TABS: 50.0000 mg | ORAL_TABLET | Freq: Two times a day (BID) | ORAL | 5 refills | Status: DC

## 2021-03-24 MED ORDER — OMEPRAZOLE 20 MG PO CPDR: DELAYED_RELEASE_CAPSULE | ORAL | 1 refills | Status: DC

## 2021-03-24 MED ORDER — FUROSEMIDE 20 MG PO TABS: 20.0000 mg | ORAL_TABLET | Freq: Every day | ORAL | 1 refills | Status: DC

## 2021-03-24 MED ORDER — SILDENAFIL CITRATE 100 MG PO TABS: 50.0000 mg | ORAL_TABLET | Freq: Every day | ORAL | 4 refills | Status: DC | PRN

## 2021-03-24 NOTE — Progress Notes (Signed)
   Subjective:    Patient ID: David Dougherty, male    DOB: Dec 15, 1950, 70 y.o.   MRN: [id number]  HPI Patient here for follow up of chronic medical conditions Afib, cardiomyopathy, long term use of anticoagulants He denies any chest tightness pressure pain shortness of breath currently does get out of breath if he pushes himself too much but this is normal for his condition overall energy level fair dietary is good denies any bleeding issues Ear pressure in both ears 3 weeks  Review of Systems     Objective:   Physical Exam Lungs are clear heart is regular rate is controlled blood pressure is good extremities some edema in the lower legs but patient states it is stable Eardrums are normal       Assessment & Plan:  1. Paroxysmal atrial fibrillation (HCC) Under good control on Coumadin and gets his INR next week no bleeding issues - CBC with Differential/Platelet - Comprehensive metabolic panel - Lipid panel  2. Secondary cardiomyopathy ([place]) Does have significant cardiomyopathy related to underlying heart disease under good control currently - CBC with Differential/Platelet - Comprehensive metabolic panel - Lipid panel  3. Essential hypertension Blood pressure good control continue current measures watch diet - CBC with Differential/Platelet - Comprehensive metabolic panel - Lipid panel  4. Other hyperlipidemia Check lipids stay healthy watch diet continue cholesterol medicine - Lipid panel  5. Vitamin D deficiency Check lab work await results - VITAMIN D 25 Hydroxy (Vit-D Deficiency, Fractures)  6. Screening for prostate cancer Screening tests recommended - PSA  7. Atherosclerotic heart disease of native coronary artery with other forms of angina pectoris [facility]) Currently denies any significant troubles states the medication does a good job keeping his breathing and chest discomfort under good control  8. Dysfunction of both eustachian tubes Doing well but if  he has ongoing trouble with this referral to ENT  Erectile dysfunction patient does not take nitroglycerin on a regular basis patient states he is open to and desires to try generic Viagra I would recommend going with just a half a tablet and see how that does do not take with nitroglycerin if any side effects stop taking  Follow-up office visit 4 months monthly INR is recommended

## 2021-03-30 ENCOUNTER — Other Ambulatory Visit (INDEPENDENT_AMBULATORY_CARE_PROVIDER_SITE_OTHER): Payer: Medicare Other | Admitting: *Deleted

## 2021-03-30 ENCOUNTER — Other Ambulatory Visit: Payer: Medicare Other

## 2021-03-30 ENCOUNTER — Other Ambulatory Visit: Payer: Self-pay

## 2021-03-30 DIAGNOSIS — Z7901 Long term (current) use of anticoagulants: Secondary | ICD-10-CM

## 2021-03-30 LAB — POCT INR: INR: 2.2 (ref 2.0–3.0)

## 2021-04-13 ENCOUNTER — Telehealth: Payer: Self-pay | Admitting: *Deleted

## 2021-04-13 NOTE — Telephone Encounter (Signed)
Pt with wife at visit today and mentioned vein on leg

## 2021-04-15 ENCOUNTER — Other Ambulatory Visit: Payer: Self-pay | Admitting: *Deleted

## 2021-04-15 DIAGNOSIS — I8390 Asymptomatic varicose veins of unspecified lower extremity: Secondary | ICD-10-CM

## 2021-04-15 NOTE — Telephone Encounter (Signed)
Referral put in.

## 2021-04-15 NOTE — Telephone Encounter (Signed)
Please go ahead with referral to Dr. Sherren Mocha early because of a varicose vein that bleeds patient is on anticoagulant It is not bleeding currently

## 2021-04-26 ENCOUNTER — Encounter: Payer: Self-pay | Admitting: Family Medicine

## 2021-04-27 ENCOUNTER — Other Ambulatory Visit: Payer: Self-pay

## 2021-04-27 ENCOUNTER — Other Ambulatory Visit (INDEPENDENT_AMBULATORY_CARE_PROVIDER_SITE_OTHER): Payer: Medicare Other | Admitting: *Deleted

## 2021-04-27 DIAGNOSIS — Z7901 Long term (current) use of anticoagulants: Secondary | ICD-10-CM | POA: Diagnosis not present

## 2021-04-27 LAB — POCT INR: INR: 2.2 (ref 2.0–3.0)

## 2021-05-09 ENCOUNTER — Other Ambulatory Visit: Payer: Self-pay

## 2021-05-09 ENCOUNTER — Emergency Department (HOSPITAL_COMMUNITY)
Admission: EM | Admit: 2021-05-09 | Discharge: 2021-05-09 | Disposition: A | Discharge status: Home / Self Care | Payer: Medicare Other | Attending: Emergency Medicine | Admitting: Emergency Medicine

## 2021-05-09 ENCOUNTER — Encounter (HOSPITAL_COMMUNITY): Payer: Self-pay | Admitting: Emergency Medicine

## 2021-05-09 ENCOUNTER — Emergency Department (HOSPITAL_COMMUNITY): Payer: Medicare Other

## 2021-05-09 DIAGNOSIS — Z87891 Personal history of nicotine dependence: Secondary | ICD-10-CM | POA: Diagnosis not present

## 2021-05-09 DIAGNOSIS — Z7901 Long term (current) use of anticoagulants: Secondary | ICD-10-CM | POA: Insufficient documentation

## 2021-05-09 DIAGNOSIS — R079 Chest pain, unspecified: Secondary | ICD-10-CM | POA: Insufficient documentation

## 2021-05-09 DIAGNOSIS — I4891 Unspecified atrial fibrillation: Secondary | ICD-10-CM | POA: Insufficient documentation

## 2021-05-09 DIAGNOSIS — I25118 Atherosclerotic heart disease of native coronary artery with other forms of angina pectoris: Secondary | ICD-10-CM | POA: Insufficient documentation

## 2021-05-09 DIAGNOSIS — R0602 Shortness of breath: Secondary | ICD-10-CM | POA: Diagnosis not present

## 2021-05-09 DIAGNOSIS — Z79899 Other long term (current) drug therapy: Secondary | ICD-10-CM | POA: Insufficient documentation

## 2021-05-09 DIAGNOSIS — I110 Hypertensive heart disease with heart failure: Secondary | ICD-10-CM | POA: Diagnosis not present

## 2021-05-09 DIAGNOSIS — Z955 Presence of coronary angioplasty implant and graft: Secondary | ICD-10-CM | POA: Diagnosis not present

## 2021-05-09 DIAGNOSIS — I5022 Chronic systolic (congestive) heart failure: Secondary | ICD-10-CM | POA: Diagnosis not present

## 2021-05-09 LAB — BASIC METABOLIC PANEL
Anion gap: 8 (ref 5–15)
BUN: 14 mg/dL (ref 8–23)
CO2: 21 mmol/L — ABNORMAL LOW (ref 22–32)
Calcium: 9.1 mg/dL (ref 8.9–10.3)
Chloride: 111 mmol/L (ref 98–111)
Creatinine, Ser: 0.9 mg/dL (ref 0.61–1.24)
GFR, Estimated: >60 mL/min (ref >60)
Glucose, Bld: 111 mg/dL — ABNORMAL HIGH (ref 70–99)
Potassium: 3.9 mmol/L (ref 3.5–5.1)
Sodium: 140 mmol/L (ref 135–145)

## 2021-05-09 LAB — CBC
HCT: 45.3 % (ref 39.0–52.0)
Hemoglobin: 14.2 g/dL (ref 13.0–17.0)
MCH: 28.7 pg (ref 26.0–34.0)
MCHC: 31.3 g/dL (ref 30.0–36.0)
MCV: 91.5 fL (ref 80.0–100.0)
Platelets: 184 10*3/uL (ref 150–400)
RBC: 4.95 MIL/uL (ref 4.22–5.81)
RDW: 13.1 % (ref 11.5–15.5)
WBC: 5.1 10*3/uL (ref 4.0–10.5)
nRBC: 0 % (ref 0.0–0.2)

## 2021-05-09 LAB — HEPATIC FUNCTION PANEL
ALT: 20 U/L (ref 0–44)
AST: 18 U/L (ref 15–41)
Albumin: 3.8 g/dL (ref 3.5–5.0)
Alkaline Phosphatase: 61 U/L (ref 38–126)
Bilirubin, Direct: 0.1 mg/dL (ref 0.0–0.2)
Indirect Bilirubin: 0.1 mg/dL — ABNORMAL LOW (ref 0.3–0.9)
Total Bilirubin: 0.2 mg/dL — ABNORMAL LOW (ref 0.3–1.2)
Total Protein: 7.3 g/dL (ref 6.5–8.1)

## 2021-05-09 LAB — PROTIME-INR
INR: 1.8 — ABNORMAL HIGH (ref 0.8–1.2)
Prothrombin Time: 21.2 seconds — ABNORMAL HIGH (ref 11.4–15.2)

## 2021-05-09 LAB — TROPONIN I (HIGH SENSITIVITY)
Troponin I (High Sensitivity): 5 ng/L (ref <18)
Troponin I (High Sensitivity): 6 ng/L (ref <18)

## 2021-05-09 MED ORDER — LIDOCAINE 5 % EX PTCH
1.0000 | MEDICATED_PATCH | CUTANEOUS | Status: DC
  Administered 2021-05-09: 1 via TRANSDERMAL

## 2021-05-09 MED ORDER — TIZANIDINE HCL 2 MG PO TABS: 2.0000 mg | ORAL_TABLET | Freq: Three times a day (TID) | ORAL | 0 refills | Status: DC | PRN

## 2021-05-09 MED ORDER — KETOROLAC TROMETHAMINE 30 MG/ML IJ SOLN
15.0000 mg | Freq: Once | INTRAMUSCULAR | Status: DC
  Filled 2021-05-09: qty 1

## 2021-05-09 MED ORDER — NAPROXEN 375 MG PO TABS: 375.0000 mg | ORAL_TABLET | Freq: Two times a day (BID) | ORAL | 0 refills | Status: DC

## 2021-05-09 MED ORDER — LIDOCAINE 5 % EX PTCH: 1.0000 | MEDICATED_PATCH | CUTANEOUS | 0 refills | Status: DC

## 2021-05-09 MED ORDER — LIDOCAINE 5 % EX PTCH
1.0000 | MEDICATED_PATCH | CUTANEOUS | Status: DC
  Filled 2021-05-09: qty 1

## 2021-05-09 MED ORDER — IBUPROFEN 400 MG PO TABS
400.0000 mg | ORAL_TABLET | Freq: Once | ORAL | Status: AC
  Administered 2021-05-09: 400 mg via ORAL
  Filled 2021-05-09: qty 1

## 2021-05-09 NOTE — ED Notes (Signed)
Reviewed discharge instructions with patient. Follow-up care and medications reviewed. Patient  verbalized understanding. Patient A&Ox4, VSS, and ambulatory with steady gait upon discharge.  °

## 2021-05-09 NOTE — ED Triage Notes (Signed)
Pt reports constant R sided chest pain x 1 week that radiates to back.  Pain worse at night with SOB, nausea, and vomiting.

## 2021-05-09 NOTE — ED Provider Notes (Signed)
Patient care assumed at 1500. Patient here for evaluation of right sided chest pain, reproducible on examination. Repeat troponin pending.  Repeat troponin is stable. Patient's pain is very well localized reproducible on examination consistent with musculoskeletal pain. Plan to discharge home with outpatient follow-up and return precautions.   Quintella Reichert, MD 05/09/21 1919

## 2021-05-09 NOTE — Discharge Instructions (Signed)

## 2021-05-09 NOTE — ED Notes (Signed)
Called lab to have hepatic function added on to previous collection

## 2021-05-09 NOTE — ED Provider Notes (Signed)
[facility] EMERGENCY DEPARTMENT Provider Note   CSN: [id number] Arrival date & time: 05/09/21  1243     History Chief Complaint  Patient presents with  . Chest Pain    David Dougherty is a 70 y.o. male.  HPI    70 year old male with history of CAD, A. fib on anticoagulation comes in w/ chief complaint of chest pain.  Patient is having right-sided chest pain that is described as sharp pain.  The pain is radiating to the back.  Pain is not typical of his ACS type pain.  He has been taking GERD medications without any relief.  Right-sided chest pain has been present for 1 week.  The pain is worse with movement and it is also worse at night.  Review of system is positive for mild shortness of breath, but the pain is not worse with deep inspiration.  He denies any nausea, vomiting, diaphoresis.  Patient also denies any exertional chest pain.  Patient denies any history of heavy smoking, drinking, substance abuse  Past Medical History:  Diagnosis Date  . Atrial fibrillation ([place])   . CAD (coronary artery disease)    70% distal circumflex 11/2014   . Dysrhythmia    AFib  . GERD (gastroesophageal reflux disease)   . Nonischemic cardiomyopathy ([place])   . Tobacco abuse    Snuff    Patient Active Problem List   Diagnosis Date Noted  . Acute CVA (cerebrovascular accident) ([place]) 04/16/2020  . History of colonic polyps 04/05/2018  . Vitamin D deficiency 01/12/2017  . Osteopenia 11/03/2016  . Cervical pain 11/25/2015  . Cervical spondylosis without myelopathy 11/25/2015  . Hemorrhoids 06/02/2015  . Loose stools 06/02/2015  . Chest pain 05/06/2015  . H/O cardiomyopathy 05/06/2015  . Essential hypertension 05/06/2015  . Chronic systolic congestive heart failure ([place]) 05/06/2015  . Barrett's esophagus   . Atherosclerotic heart disease of native coronary artery with other forms of angina pectoris ([place]) 12/22/2014  . Rectal bleeding 12/18/2014  . Secondary  cardiomyopathy ([place]) 11/21/2014  . Elevated PSA 11/20/2014  . Hyperlipidemia 11/20/2014  . Elevated fasting glucose 11/20/2014  . Atrial fibrillation ([place]) 08/12/2010  . GASTROESOPHAGEAL REFLUX DISEASE 08/12/2010    Past Surgical History:  Procedure Laterality Date  . APPENDECTOMY    . CATARACT EXTRACTION W/PHACO Left 11/18/2019   Procedure: CATARACT EXTRACTION PHACO AND INTRAOCULAR LENS PLACEMENT (IOC);  Surgeon: [name], MD;  Location: [facility] ORS;  Service: Ophthalmology;  Laterality: Left;  CDE: 9.66  . CATARACT EXTRACTION W/PHACO Right 12/04/2019   Procedure: CATARACT EXTRACTION PHACO AND INTRAOCULAR LENS PLACEMENT RIGHT EYE (CDE: 4.56);  Surgeon: [name], MD;  Location: [facility];  Service: Ophthalmology;  Laterality: Right;  . COLONOSCOPY  2005   Neg  . COLONOSCOPY N/A 01/05/2015   SLF:17 colon polyps removed/moderate sized hemorrhoids  . COLONOSCOPY WITH PROPOFOL N/A 06/19/2018   Procedure: COLONOSCOPY WITH PROPOFOL;  Surgeon: Danie Binder, MD;  Location: [facility];  Service: Endoscopy;  Laterality: N/A;  7:30am  . ESOPHAGOGASTRODUODENOSCOPY N/A 01/05/2015   JIR:CVELFYBOF at gastro junction/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/small HH/mild erosive gastrtis and duodenitis  . ESOPHAGOGASTRODUODENOSCOPY (EGD) WITH PROPOFOL N/A 06/19/2018   Procedure: ESOPHAGOGASTRODUODENOSCOPY (EGD) WITH PROPOFOL;  Surgeon: Danie Binder, MD;  Location: [facility];  Service: Endoscopy;  Laterality: N/A;  . HEMORRHOID BANDING N/A 01/05/2015   Procedure: HEMORRHOID BANDING;  Surgeon: Danie Binder, MD;  Location: [facility];  Service: Endoscopy;  Laterality: N/A;  . LEFT AND RIGHT  HEART CATHETERIZATION WITH CORONARY ANGIOGRAM N/A 12/03/2014   Procedure: LEFT AND RIGHT HEART CATHETERIZATION WITH CORONARY ANGIOGRAM;  Surgeon: Blane Ohara, MD;  Location: [facility] CATH LAB;  Service: Cardiovascular;  Laterality: N/A;  . POLYPECTOMY  06/19/2018   Procedure: POLYPECTOMY;  Surgeon: Danie Binder, MD;   Location: [facility];  Service: Endoscopy;;  rectal   . SAVORY DILATION  06/19/2018   Procedure: SAVORY DILATION;  Surgeon: Danie Binder, MD;  Location: [facility];  Service: Endoscopy;;  . TONSILLECTOMY         Family History  Problem Relation Age of Onset  . Heart attack Father   . Hypertension Father   . Diabetes Father   . Colon cancer Neg Hx   . Colon polyps Neg Hx     Social History   Tobacco Use  . Smoking status: Former Smoker    Packs/day: 0.25    Years: 3.00    Pack years: 0.75    Types: Cigars, Cigarettes    Start date: 10/19/1969    Quit date: 10/22/1972    Years since quitting: 48.5  . Smokeless tobacco: Former Systems developer    Types: Snuff  Vaping Use  . Vaping Use: Never used  Substance Use Topics  . Alcohol use: No    Alcohol/week: 0.0 standard drinks  . Drug use: No    Home Medications Prior to Admission medications   Medication Sig Start Date End Date Taking? Authorizing Provider  naproxen (NAPROSYN) 375 MG tablet Take 1 tablet (375 mg total) by mouth 2 (two) times daily. 05/09/21  Yes Varney Biles, MD  tiZANidine (ZANAFLEX) 2 MG tablet Take 1 tablet (2 mg total) by mouth every 8 (eight) hours as needed for muscle spasms. 05/09/21  Yes Varney Biles, MD  atorvastatin (LIPITOR) 40 MG tablet Take 1 tablet (40 mg total) by mouth daily. 03/24/21   Kathyrn Drown, MD  calcium carbonate (TUMS - DOSED IN MG ELEMENTAL CALCIUM) 500 MG chewable tablet Chew 2-3 tablets by mouth as needed for indigestion or heartburn.     [provider]  carvedilol (COREG) 3.125 MG tablet Take 1 tablet by mouth twice daily 12/14/20   Kathyrn Drown, MD  Cholecalciferol (VITAMIN D-3) 125 MCG (5000 UT) TABS Take by mouth.    [provider]  cyclobenzaprine (FLEXERIL) 5 MG tablet 1 qhs prn 04/28/20   Luking, Elayne Snare, MD  fluticasone (FLONASE) 50 MCG/ACT nasal spray Place 2 sprays into both nostrils daily. 03/24/21   Kathyrn Drown, MD  furosemide (LASIX) 20 MG  tablet Take 1 tablet (20 mg total) by mouth daily. 03/24/21   Kathyrn Drown, MD  losartan (COZAAR) 25 MG tablet Take 1 tablet (25 mg total) by mouth daily. 03/24/21   Kathyrn Drown, MD  nitroGLYCERIN (NITROSTAT) 0.4 MG SL tablet PLACE 1 TAB UNDER TONGUE EVERY 5 MIN IF NEEDED FOR CHEST PAIN. MAY USE 3 TIMES.NO RELIEF CALL 911. Patient taking differently: Place 0.4 mg under the tongue every 5 (five) minutes as needed for chest pain. 02/14/17   Satira Sark, MD  omeprazole (PRILOSEC) 20 MG capsule 1 qd 03/24/21   Kathyrn Drown, MD  oxymetazoline (AFRIN) 0.05 % nasal spray Place 1 spray into both nostrils 2 (two) times daily as needed for congestion.    [provider]  rOPINIRole (REQUIP) 1 MG tablet Take 1 tablet (1 mg total) by mouth at bedtime. 03/24/21   Kathyrn Drown, MD  sildenafil (VIAGRA)  100 MG tablet Take 0.5-1 tablets (50-100 mg total) by mouth daily as needed for erectile dysfunction. 03/24/21   Kathyrn Drown, MD  topiramate (TOPAMAX) 50 MG tablet Take 1 tablet (50 mg total) by mouth 2 (two) times daily. 03/24/21   Kathyrn Drown, MD  warfarin (COUMADIN) 5 MG tablet TAKE 1 TO 2 TABLETS BY MOUTH ONCE DAILY BASED ON INR - DOSE CHANGE 12/28/20   Kathyrn Drown, MD    Allergies    Reglan [metoclopramide]  Review of Systems   Review of Systems  Constitutional: Positive for activity change.  Respiratory: Negative for cough.   Cardiovascular: Positive for chest pain.  Neurological: Negative for dizziness.  All other systems reviewed and are negative.   Physical Exam Updated Vital Signs BP (!) 139/97   Pulse 86   Temp (!) 97.5 F (36.4 C)   Resp 19   Ht 6\' 1"  (1.854 m)   Wt 95.3 kg   SpO2 96%   BMI 27.71 kg/m   Physical Exam Vitals and nursing note reviewed.  Constitutional:      Appearance: He is well-developed.  HENT:     Head: Atraumatic.  Cardiovascular:     Rate and Rhythm: Normal rate.     Pulses:          Radial pulses are 2+ on the right side  and 2+ on the left side.     Heart sounds: Normal heart sounds.  Pulmonary:     Effort: Pulmonary effort is normal.     Breath sounds: Normal breath sounds.  Musculoskeletal:     Cervical back: Neck supple.  Skin:    General: Skin is warm.  Neurological:     Mental Status: He is alert and oriented to person, place, and time.     ED Results / Procedures / Treatments   Labs (all labs ordered are listed, but only abnormal results are displayed) Labs Reviewed  BASIC METABOLIC PANEL - Abnormal; Notable for the following components:      Result Value   CO2 21 (*)    Glucose, Bld 111 (*)    All other components within normal limits  PROTIME-INR - Abnormal; Notable for the following components:   Prothrombin Time 21.2 (*)    INR 1.8 (*)    All other components within normal limits  CBC  TROPONIN I (HIGH SENSITIVITY)  TROPONIN I (HIGH SENSITIVITY)    EKG EKG Interpretation  Date/Time:  Sunday May 09 2021 12:48:37 EDT Ventricular Rate:  81 PR Interval:    QRS Duration: 100 QT Interval:  366 QTC Calculation: 425 R Axis:   -19 Text Interpretation: Atrial fibrillation Possible Anterior infarct , age undetermined Abnormal ECG No acute changes No significant change since last tracing Confirmed by Varney Biles ([id number]) on 05/09/2021 3:24:27 PM   Radiology DG Chest 2 View  Result Date: 05/09/2021 CLINICAL DATA:  Chest pain EXAM: CHEST - 2 VIEW COMPARISON:  Apr 15, 2020 FINDINGS: Lungs are clear. There is cardiomegaly with pulmonary vascularity normal. No adenopathy. No bone lesions. IMPRESSION: Stable cardiac prominence.  No edema or airspace opacity. Electronically Signed   By: Lowella Grip III M.D.   On: 05/09/2021 13:24    Procedures Procedures   Medications Ordered in ED Medications  lidocaine (LIDODERM) 5 % 1 patch (has no administration in time range)  ketorolac (TORADOL) 30 MG/ML injection 15 mg (has no administration in time range)  lidocaine (LIDODERM) 5 % 1  patch (has no administration  in time range)    ED Course  I have reviewed the triage vital signs and the nursing notes.  Pertinent labs & imaging results that were available during my care of the patient were reviewed by me and considered in my medical decision making (see chart for details).    MDM Rules/Calculators/A&P                          70 year old male with history of CAD comes in a chief complaint of chest pain.  He has history of A. fib and is on blood thinners for it.  The pain is not pleuritic or exertional.  The pain is positional in nature, worse at nighttime.   Unsure what the underlying causes is.  Clinically does not appear that patient has PE.  No risk factors for dissection, vascular exam is normal.  Doubt that patient has dissection at this time.  ACS is in the differential, but lower as well, delta troponin ordered.    If patient's work-up is negative, then will be discharged with OTC pain meds.  Musculoskeletal etiology is highest in the differential as the pain is reproducible with him trying to sit up and with patient turning left or right.  Final Clinical Impression(s) / ED Diagnoses Final diagnoses:  Right-sided chest pain    Rx / DC Orders ED Discharge Orders         Ordered    naproxen (NAPROSYN) 375 MG tablet  2 times daily        05/09/21 1601    tiZANidine (ZANAFLEX) 2 MG tablet  Every 8 hours PRN        05/09/21 1601           Varney Biles, MD 05/09/21 1603

## 2021-05-25 ENCOUNTER — Other Ambulatory Visit (INDEPENDENT_AMBULATORY_CARE_PROVIDER_SITE_OTHER): Payer: Medicare Other

## 2021-05-25 ENCOUNTER — Other Ambulatory Visit: Payer: Self-pay

## 2021-05-25 DIAGNOSIS — Z7901 Long term (current) use of anticoagulants: Secondary | ICD-10-CM | POA: Diagnosis not present

## 2021-05-25 DIAGNOSIS — I480 Paroxysmal atrial fibrillation: Secondary | ICD-10-CM

## 2021-05-25 LAB — POCT INR: INR: 2 (ref 2.0–3.0)

## 2021-05-25 NOTE — Progress Notes (Unsigned)
Patient here for INR check  2.0

## 2021-06-07 ENCOUNTER — Other Ambulatory Visit: Payer: Self-pay | Admitting: Family Medicine

## 2021-06-08 NOTE — Telephone Encounter (Signed)
May have 90-day with 1 refill

## 2021-06-11 ENCOUNTER — Other Ambulatory Visit: Payer: Self-pay | Admitting: *Deleted

## 2021-06-11 DIAGNOSIS — I83893 Varicose veins of bilateral lower extremities with other complications: Secondary | ICD-10-CM

## 2021-06-21 ENCOUNTER — Other Ambulatory Visit: Payer: Self-pay | Admitting: Family Medicine

## 2021-06-22 ENCOUNTER — Other Ambulatory Visit: Payer: Self-pay

## 2021-06-22 ENCOUNTER — Encounter: Payer: Medicare Other | Admitting: Vascular Surgery

## 2021-06-22 ENCOUNTER — Other Ambulatory Visit (INDEPENDENT_AMBULATORY_CARE_PROVIDER_SITE_OTHER): Payer: Medicare Other

## 2021-06-22 ENCOUNTER — Other Ambulatory Visit: Payer: Self-pay | Admitting: Family Medicine

## 2021-06-22 ENCOUNTER — Ambulatory Visit (HOSPITAL_COMMUNITY): Payer: Medicare Other

## 2021-06-22 DIAGNOSIS — Z7901 Long term (current) use of anticoagulants: Secondary | ICD-10-CM | POA: Diagnosis not present

## 2021-06-22 LAB — POCT INR: INR: 1.8 — AB (ref 2.0–3.0)

## 2021-06-22 NOTE — Patient Instructions (Signed)
Recheck in 4 weeks (around 07/20/21). Take extra 1/2 tablet tonight with supper; then CST Wednesday.

## 2021-07-05 ENCOUNTER — Other Ambulatory Visit: Payer: Self-pay | Admitting: Family Medicine

## 2021-07-15 ENCOUNTER — Ambulatory Visit (INDEPENDENT_AMBULATORY_CARE_PROVIDER_SITE_OTHER): Payer: Medicare Other | Admitting: Physician Assistant

## 2021-07-15 ENCOUNTER — Ambulatory Visit (HOSPITAL_COMMUNITY)
Admission: RE | Admit: 2021-07-15 | Discharge: 2021-07-15 | Disposition: A | Discharge status: Home / Self Care | Payer: Medicare Other | Source: Ambulatory Visit | Attending: Vascular Surgery | Admitting: Vascular Surgery

## 2021-07-15 ENCOUNTER — Other Ambulatory Visit: Payer: Self-pay

## 2021-07-15 VITALS — BP 154/102 | HR 66 | Temp 97.0°F | Ht 73.0 in | Wt 215.6 lb

## 2021-07-15 DIAGNOSIS — I872 Venous insufficiency (chronic) (peripheral): Secondary | ICD-10-CM | POA: Diagnosis not present

## 2021-07-15 DIAGNOSIS — I83891 Varicose veins of right lower extremities with other complications: Secondary | ICD-10-CM

## 2021-07-15 DIAGNOSIS — I8390 Asymptomatic varicose veins of unspecified lower extremity: Secondary | ICD-10-CM | POA: Diagnosis not present

## 2021-07-15 DIAGNOSIS — R600 Localized edema: Secondary | ICD-10-CM | POA: Diagnosis not present

## 2021-07-15 DIAGNOSIS — I83893 Varicose veins of bilateral lower extremities with other complications: Secondary | ICD-10-CM | POA: Diagnosis not present

## 2021-07-15 DIAGNOSIS — I8393 Asymptomatic varicose veins of bilateral lower extremities: Secondary | ICD-10-CM

## 2021-07-15 NOTE — Progress Notes (Signed)
Requested by:  Kathyrn Drown, MD [address] [place],  [place] [address]  Reason for consultation: right LE bleeding varicose vein and swelling    History of Present Illness   David Dougherty is a 70 y.o. (1951-11-28) male who presents for evaluation of spontaneous left ankle spider vein bleeding. Has bilateral LE edema. No pain or claudication. On coumadin for atrial fibrillation Venous symptoms include: positive if (X) [  ] aching [  ] heavy [  ] tired  [  ] throbbing [  ] burning  [  ] itching [  ]swelling [ x ] bleeding [  ] ulcer  Onset/duration:  approx one week ago  Occupation:  retired; works part-time as Dealer Aggravating factors: standing Alleviating factors: elevation, compression Compression:  yes Helps:  yes Pain medications:  no Previous vein procedures:  no History of DVT:  no  Past Medical History:  Diagnosis Date   Atrial fibrillation ([place])    CAD (coronary artery disease)    70% distal circumflex 11/2014    Dysrhythmia    AFib   GERD (gastroesophageal reflux disease)    Nonischemic cardiomyopathy ([facility])    Tobacco abuse    Snuff    Past Surgical History:  Procedure Laterality Date   APPENDECTOMY     CATARACT EXTRACTION W/PHACO Left 11/18/2019   Procedure: CATARACT EXTRACTION PHACO AND INTRAOCULAR LENS PLACEMENT ([place]);  Surgeon: [name], MD;  Location: [facility];  Service: Ophthalmology;  Laterality: Left;  CDE: 9.66   CATARACT EXTRACTION W/PHACO Right 12/04/2019   Procedure: CATARACT EXTRACTION PHACO AND INTRAOCULAR LENS PLACEMENT RIGHT EYE (CDE: 4.56);  Surgeon: [name], MD;  Location: [facility];  Service: Ophthalmology;  Laterality: Right;   COLONOSCOPY  2005   Neg   COLONOSCOPY N/A 01/05/2015   SLF:17 colon polyps removed/moderate sized hemorrhoids   COLONOSCOPY WITH PROPOFOL N/A 06/19/2018   Procedure: COLONOSCOPY WITH PROPOFOL;  Surgeon: Danie Binder, MD;  Location: [facility];  Service: Endoscopy;   Laterality: N/A;  7:30am   ESOPHAGOGASTRODUODENOSCOPY N/A 01/05/2015   [id number] at gastro junction/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/small HH/mild erosive gastrtis and duodenitis   ESOPHAGOGASTRODUODENOSCOPY (EGD) WITH PROPOFOL N/A 06/19/2018   Procedure: ESOPHAGOGASTRODUODENOSCOPY (EGD) WITH PROPOFOL;  Surgeon: Danie Binder, MD;  Location: [facility];  Service: Endoscopy;  Laterality: N/A;   HEMORRHOID BANDING N/A 01/05/2015   Procedure: HEMORRHOID BANDING;  Surgeon: Danie Binder, MD;  [facility];  Service: Endoscopy;  Laterality: N/A;   LEFT AND RIGHT HEART CATHETERIZATION WITH CORONARY ANGIOGRAM N/A 12/03/2014   Procedure: LEFT AND RIGHT HEART CATHETERIZATION WITH CORONARY ANGIOGRAM;  Surgeon: Blane Ohara, MD;  Location: [facility] C[facility] LAB;  Service: Cardiovascular;  Laterality: N/A;   POLYPECTOMY  06/19/2018   Procedure: POLYPECTOMY;  Surgeon: Danie Binder, MD;  [facility]: [facility];  Service: Endoscopy;;  rectal    SAVORY DILATION  06/19/2018   Procedure: SAVORY DILATION;  Surgeon: Danie Binder, MD;  [facility]: [facility];  Service: Endoscopy;;   TONSILLECTOMY      Social History   Socioeconomic History   Marital status: Married    Spouse name: Not on file   Number of children: Not on file   Years of education: Not on file   Highest education level: Not on file  Occupational History   Occupation: Automotive transmission repair  Tobacco Use   Smoking status: Former    Packs/day: 0.25    Years: 3.00  Pack years: 0.75    Types: Cigars, Cigarettes    Start date: 10/19/1969    Quit date: 10/22/1972    Years since quitting: 48.7   Smokeless tobacco: Former    Types: Snuff  Vaping Use   Vaping Use: Never used  Substance and Sexual Activity   Alcohol use: No    Alcohol/week: 0.0 standard drinks   Drug use: No   Sexual activity: Yes  Other Topics Concern   Not on file  Social History Narrative   Married with 5 children   No regular exercise    Social Determinants of Health   Financial Resource Strain: Not on file  Food Insecurity: Not on file  Transportation Needs: Not on file  Physical Activity: Not on file  Stress: Not on file  Social Connections: Not on file  Intimate Partner Violence: Not on file    Family History  Problem Relation Age of Onset   Heart attack Father    Hypertension Father    Diabetes Father    Colon cancer Neg Hx    Colon polyps Neg Hx     Current Outpatient Medications  Medication Sig Dispense Refill   atorvastatin (LIPITOR) 40 MG tablet Take 1 tablet (40 mg total) by mouth daily. 90 tablet 1   calcium carbonate (TUMS - DOSED IN MG ELEMENTAL CALCIUM) 500 MG chewable tablet Chew 2-3 tablets by mouth as needed for indigestion or heartburn.      carvedilol (COREG) 3.125 MG tablet Take 1 tablet by mouth twice daily 180 tablet 1   Cholecalciferol (VITAMIN D-3) 125 MCG (5000 UT) TABS Take 5,000 Units by mouth daily.     cyclobenzaprine (FLEXERIL) 5 MG tablet 1 qhs prn (Patient taking differently: Take 5 mg by mouth at bedtime as needed for muscle spasms.) 30 tablet 1   fluticasone (FLONASE) 50 MCG/ACT nasal spray Place 2 sprays into both nostrils daily. 16 g 5   furosemide (LASIX) 20 MG tablet Take 1 tablet by mouth once daily 90 tablet 1   lidocaine (LIDODERM) 5 % Place 1 patch onto the skin daily. Remove & Discard patch within 12 hours or as directed by MD 15 patch 0   losartan (COZAAR) 25 MG tablet Take 1 tablet (25 mg total) by mouth daily. 90 tablet 1   naproxen (NAPROSYN) 375 MG tablet Take 1 tablet (375 mg total) by mouth 2 (two) times daily. 20 tablet 0   nitroGLYCERIN (NITROSTAT) 0.4 MG SL tablet PLACE 1 TAB UNDER TONGUE EVERY 5 MIN IF NEEDED FOR CHEST PAIN. MAY USE 3 TIMES.NO RELIEF CALL 911. (Patient taking differently: Place 0.4 mg under the tongue every 5 (five) minutes as needed for chest pain.) 25 tablet 0   omeprazole (PRILOSEC) 20 MG capsule TAKE 1 CAPSULE BY MOUTH ONCE DAILY 30 MINUTES  BEFORE BREAKFAST 90 capsule 0   oxymetazoline (AFRIN) 0.05 % nasal spray Place 1 spray into both nostrils 2 (two) times daily as needed for congestion.     rOPINIRole (REQUIP) 1 MG tablet TAKE 1 TABLET BY MOUTH AT BEDTIME 90 tablet 1   sildenafil (VIAGRA) 100 MG tablet Take 0.5-1 tablets (50-100 mg total) by mouth daily as needed for erectile dysfunction. 5 tablet 4   tiZANidine (ZANAFLEX) 2 MG tablet Take 1 tablet (2 mg total) by mouth every 8 (eight) hours as needed for muscle spasms. 12 tablet 0   topiramate (TOPAMAX) 50 MG tablet Take 1 tablet (50 mg total) by mouth 2 (two) times daily. 60 tablet  5   warfarin (COUMADIN) 5 MG tablet TAKE 1 TO 2 TABLETS BY MOUTH ONCE DAILY BASED ON INR - DOSE CHANGE 180 tablet 0   No current facility-administered medications for this visit.    Allergies  Allergen Reactions   Reglan [Metoclopramide] Anaphylaxis    REVIEW OF SYSTEMS (negative unless checked):   Cardiac:  '[]'$  Chest pain or chest pressure? '[]'$  Shortness of breath upon activity? '[]'$  Shortness of breath when lying flat? '[]'$  Irregular heart rhythm?  Vascular:  '[]'$  Pain in calf, thigh, or hip brought on by walking? '[]'$  Pain in feet at night that wakes you up from your sleep? '[]'$  Blood clot in your veins? '[x]'$  Leg swelling?  Pulmonary:  '[]'$  Oxygen at home? '[]'$  Productive cough? '[]'$  Wheezing?  Neurologic:  '[]'$  Sudden weakness in arms or legs? '[]'$  Sudden numbness in arms or legs? '[]'$  Sudden onset of difficult speaking or slurred speech? '[]'$  Temporary loss of vision in one eye? '[]'$  Problems with dizziness?  Gastrointestinal:  '[]'$  Blood in stool? '[]'$  Vomited blood?  Genitourinary:  '[]'$  Burning when urinating? '[]'$  Blood in urine?  Psychiatric:  '[]'$  Major depression  Hematologic:  '[]'$  Bleeding problems? '[]'$  Problems with blood clotting?  Dermatologic:  '[]'$  Rashes or ulcers?  Constitutional:  '[]'$  Fever or chills?  Ear/Nose/Throat:  '[]'$  Change in hearing? '[]'$  Nose bleeds? '[]'$  Sore  throat?  Musculoskeletal:  '[]'$  Back pain? '[]'$  Joint pain? '[]'$  Muscle pain?   Physical Examination     Vitals:   07/15/21 1322  BP: (!) 154/102  Pulse: 66  Temp: (!) 97 F (36.1 C)  SpO2: 100%  Weight: 215 lb 9.6 oz (97.8 kg)  Height: '6\' 1"'$  (1.854 m)   Body mass index is 28.44 kg/m.  General:  WDWN in NAD; vital signs documented above Gait: unaided, no ataxia HENT: WNL, normocephalic Pulmonary: normal non-labored breathing  Cardiac: regular HR Abdomen: soft, NT, no masses Skin: without rashes Vascular Exam/Pulses: 2+ dorsalis pedis, posterior tibial pulses bilaterally Extremities: without varicose veins, with reticular veins, with edema, with stasis pigmentation, without lipodermatosclerosis, without ulcers Musculoskeletal: no muscle wasting or atrophy  Neurologic: A&O X 3;  No focal weakness or paresthesias are detected Psychiatric:  The pt has Normal affect.     Non-invasive Vascular Imaging   RLE Venous Insufficiency Duplex  Right:  - No evidence of deep vein thrombosis seen in the right lower extremity,  from the common femoral through the popliteal veins.  - Age indeterminant superficial thrombus right SSV.  - Venous reflux noted in the GSV at the knee and proximal calf.      Medical Decision Making   David Dougherty is a 70 y.o. male who presents with: BLE chronic venous insufficiency, right reticular veins with complications of bleeding. No evidence of DVT. Age indeterminate thrombus of right small saphenous vein. Patient is on Coumadin. + reflux of right GSV without significant dilatation.  Based on the patient's history and examination, I recommend: consultation with sclerotherapy RN. He is given contact information and I explained Medicare may or not pay for this procedure.  In the event spontaneous bleeding reoccurs, advised him to place gauze pad and Ace wrap over bleeding vein and elevate his leg. Avoid prolonged sitting or standing, elevate lower  extremities in proper position, compression stockings. Patient is given written information. I discussed with the patient the use of 15-20 mm thigh high compression stockings. Thank you for allowing [place] to participate in this patient's care.   Edman Circle  Vaughan Basta, PA-C [facility] Office: [phone]  07/15/2021, 1:27 PM  Clinic MD: Oneida Alar

## 2021-07-20 ENCOUNTER — Other Ambulatory Visit: Payer: Self-pay

## 2021-07-20 ENCOUNTER — Other Ambulatory Visit: Payer: Medicare Other

## 2021-07-20 ENCOUNTER — Other Ambulatory Visit (INDEPENDENT_AMBULATORY_CARE_PROVIDER_SITE_OTHER): Payer: Medicare Other

## 2021-07-20 DIAGNOSIS — Z7901 Long term (current) use of anticoagulants: Secondary | ICD-10-CM

## 2021-07-20 LAB — POCT INR: INR: 1.8 — AB (ref 2.0–3.0)

## 2021-08-17 ENCOUNTER — Other Ambulatory Visit (INDEPENDENT_AMBULATORY_CARE_PROVIDER_SITE_OTHER): Payer: Medicare Other

## 2021-08-17 ENCOUNTER — Other Ambulatory Visit: Payer: Self-pay

## 2021-08-17 DIAGNOSIS — Z7901 Long term (current) use of anticoagulants: Secondary | ICD-10-CM

## 2021-08-17 LAB — POCT INR: INR: 2.4 (ref 2.0–3.0)

## 2021-08-18 ENCOUNTER — Other Ambulatory Visit: Payer: Self-pay | Admitting: Family Medicine

## 2021-08-18 DIAGNOSIS — R972 Elevated prostate specific antigen [PSA]: Secondary | ICD-10-CM

## 2021-08-18 LAB — CBC WITH DIFFERENTIAL/PLATELET
Basophils Absolute: 0 10*3/uL (ref 0.0–0.2)
Basos: 0 %
EOS (ABSOLUTE): 0.2 10*3/uL (ref 0.0–0.4)
Eos: 3 %
Hematocrit: 45.2 % (ref 37.5–51.0)
Hemoglobin: 14.5 g/dL (ref 13.0–17.7)
Immature Grans (Abs): 0 10*3/uL (ref 0.0–0.1)
Immature Granulocytes: 0 %
Lymphocytes Absolute: 1.8 10*3/uL (ref 0.7–3.1)
Lymphs: 35 %
MCH: 28.3 pg (ref 26.6–33.0)
MCHC: 32.1 g/dL (ref 31.5–35.7)
MCV: 88 fL (ref 79–97)
Monocytes Absolute: 0.4 10*3/uL (ref 0.1–0.9)
Monocytes: 8 %
Neutrophils Absolute: 2.8 10*3/uL (ref 1.4–7.0)
Neutrophils: 54 %
Platelets: 196 10*3/uL (ref 150–450)
RBC: 5.12 x10E6/uL (ref 4.14–5.80)
RDW: 12.8 % (ref 11.6–15.4)
WBC: 5.2 10*3/uL (ref 3.4–10.8)

## 2021-08-18 LAB — LIPID PANEL
Chol/HDL Ratio: 3.5 ratio (ref 0.0–5.0)
Cholesterol, Total: 105 mg/dL (ref 100–199)
HDL: 30 mg/dL — ABNORMAL LOW (ref >39)
LDL Chol Calc (NIH): 54 mg/dL (ref 0–99)
Triglycerides: 111 mg/dL (ref 0–149)
VLDL Cholesterol Cal: 21 mg/dL (ref 5–40)

## 2021-08-18 LAB — COMPREHENSIVE METABOLIC PANEL
ALT: 19 IU/L (ref 0–44)
AST: 16 IU/L (ref 0–40)
Albumin/Globulin Ratio: 1.7 (ref 1.2–2.2)
Albumin: 4.4 g/dL (ref 3.8–4.8)
Alkaline Phosphatase: 89 IU/L (ref 44–121)
BUN/Creatinine Ratio: 17 (ref 10–24)
BUN: 18 mg/dL (ref 8–27)
Bilirubin Total: 0.6 mg/dL (ref 0.0–1.2)
CO2: 20 mmol/L (ref 20–29)
Calcium: 8.9 mg/dL (ref 8.6–10.2)
Chloride: 106 mmol/L (ref 96–106)
Creatinine, Ser: 1.07 mg/dL (ref 0.76–1.27)
Globulin, Total: 2.6 g/dL (ref 1.5–4.5)
Glucose: 99 mg/dL (ref 65–99)
Potassium: 3.6 mmol/L (ref 3.5–5.2)
Sodium: 140 mmol/L (ref 134–144)
Total Protein: 7 g/dL (ref 6.0–8.5)
eGFR: 75 mL/min/{1.73_m2} (ref >59)

## 2021-08-18 LAB — VITAMIN D 25 HYDROXY (VIT D DEFICIENCY, FRACTURES): Vit D, 25-Hydroxy: 37.6 ng/mL (ref 30.0–100.0)

## 2021-08-18 LAB — PSA: Prostate Specific Ag, Serum: 4.2 ng/mL — ABNORMAL HIGH (ref 0.0–4.0)

## 2021-08-23 NOTE — Addendum Note (Signed)
Addended by: Dairl Ponder on: 08/23/2021 10:25 AM   Modules accepted: Orders

## 2021-09-07 ENCOUNTER — Telehealth: Payer: Self-pay | Admitting: Family Medicine

## 2021-09-07 NOTE — Telephone Encounter (Signed)
Patient has broken off a tooth and wanting to get it pulled is it ok with the medications he is on. Please advise

## 2021-09-07 NOTE — Telephone Encounter (Signed)
Patient advised per Dr Nicki Reaper: First of all he needs to talk to the dentist to see what their comfort level is with pulling at tooth with someone who is on Coumadin   Under current guidelines typically if it is just 1 tooth being pulled you do not have to go off of Coumadin Often we will hold the medication for 3 days before the tooth being pulled   The first step is for him to talk with the dentist to see how comfortable they are with doing the above  Patient verbalized understanding and stated he will talk with the dentist and let us know if he needs anything.

## 2021-09-07 NOTE — Telephone Encounter (Signed)
First of all he needs to talk to the dentist to see what their comfort level is with pulling at tooth with someone who is on Coumadin  Under current guidelines typically if it is just 1 tooth being pulled you do not have to go off of Coumadin Often we will hold the medication for 3 days before the tooth being pulled  The first step is for him to talk with the dentist to see how comfortable they are with doing the above

## 2021-09-16 ENCOUNTER — Ambulatory Visit (INDEPENDENT_AMBULATORY_CARE_PROVIDER_SITE_OTHER): Payer: Medicare Other | Admitting: Family Medicine

## 2021-09-16 ENCOUNTER — Ambulatory Visit: Payer: Medicare Other | Admitting: Family Medicine

## 2021-09-16 ENCOUNTER — Other Ambulatory Visit: Payer: Self-pay

## 2021-09-16 VITALS — BP 138/84 | Temp 97.9°F | Wt 214.6 lb

## 2021-09-16 DIAGNOSIS — Z7901 Long term (current) use of anticoagulants: Secondary | ICD-10-CM | POA: Diagnosis not present

## 2021-09-16 DIAGNOSIS — R972 Elevated prostate specific antigen [PSA]: Secondary | ICD-10-CM

## 2021-09-16 LAB — POCT INR: INR: 2.3 (ref 2.0–3.0)

## 2021-09-16 MED ORDER — OMEPRAZOLE 20 MG PO CPDR: DELAYED_RELEASE_CAPSULE | ORAL | 1 refills | Status: DC

## 2021-09-16 MED ORDER — LOSARTAN POTASSIUM 25 MG PO TABS: 25.0000 mg | ORAL_TABLET | Freq: Every day | ORAL | 1 refills | Status: DC

## 2021-09-16 MED ORDER — HYDROCODONE-ACETAMINOPHEN 5-325 MG PO TABS: ORAL_TABLET | ORAL | 0 refills | Status: DC

## 2021-09-16 MED ORDER — CARVEDILOL 3.125 MG PO TABS: 3.1250 mg | ORAL_TABLET | Freq: Two times a day (BID) | ORAL | 1 refills | Status: DC

## 2021-09-16 MED ORDER — ATORVASTATIN CALCIUM 40 MG PO TABS: 40.0000 mg | ORAL_TABLET | Freq: Every day | ORAL | 1 refills | Status: DC

## 2021-09-16 MED ORDER — FUROSEMIDE 20 MG PO TABS: 20.0000 mg | ORAL_TABLET | Freq: Every day | ORAL | 1 refills | Status: DC

## 2021-09-16 MED ORDER — TOPIRAMATE 50 MG PO TABS: 50.0000 mg | ORAL_TABLET | Freq: Two times a day (BID) | ORAL | 5 refills | Status: DC

## 2021-09-16 NOTE — Progress Notes (Signed)
   Subjective:    Patient ID: David Dougherty, male    DOB: 08/02/1951, 70 y.o.   MRN: [id number]  HPI Pt here for follow up on HTN and INR. Pt states he has been doing well. Taking meds as prescribed. Pt forgot Warfarin last night but did take it this morning.   Encounter for current long-term use of anticoagulants - Plan: POCT INR  PSA elevation - Plan: PSA, PSA, total and free Reviewed over recent labs PSA elevated We did discuss this in detail Patient denies any chest tightness pressure pain shortness of breath He is taking his heart medicines Also taking his diuretic Kidney functions look good  Review of Systems     Objective:   Physical Exam  General-in no acute distress Eyes-no discharge Lungs-respiratory rate normal, CTA CV-no murmurs,RRR Extremities skin warm dry no edema Neuro grossly normal Behavior normal, alert       Assessment & Plan:  Underlying heart disease does not want to see cardiology Currently taking Coumadin For stroke prevention Denies any setbacks no chest pain or shortness of breath Consider SGLT2 in the future Stick with current regimen Referral to urology for elevated PSA Repeat lab work before that visit Follow-up here within 3 to 4 months Continue Coumadin as is no bleeding issues Check this monthly

## 2021-10-11 ENCOUNTER — Other Ambulatory Visit: Payer: Self-pay | Admitting: Family Medicine

## 2021-10-13 MED ORDER — WARFARIN SODIUM 5 MG PO TABS: ORAL_TABLET | ORAL | 0 refills | Status: DC

## 2021-10-14 ENCOUNTER — Other Ambulatory Visit (INDEPENDENT_AMBULATORY_CARE_PROVIDER_SITE_OTHER): Payer: Medicare Other

## 2021-10-14 ENCOUNTER — Other Ambulatory Visit: Payer: Self-pay

## 2021-10-14 DIAGNOSIS — Z7901 Long term (current) use of anticoagulants: Secondary | ICD-10-CM | POA: Diagnosis not present

## 2021-10-14 LAB — POCT INR: INR: 2.2 (ref 2.0–3.0)

## 2021-10-19 LAB — PSA, TOTAL AND FREE
PSA, Free Pct: 47.9 %
PSA, Free: 3.45 ng/mL
Prostate Specific Ag, Serum: 7.2 ng/mL — ABNORMAL HIGH (ref 0.0–4.0)

## 2021-10-21 NOTE — Progress Notes (Signed)
Left message to return call 

## 2021-10-25 ENCOUNTER — Ambulatory Visit: Payer: Medicare Other | Admitting: Urology

## 2021-10-25 ENCOUNTER — Other Ambulatory Visit: Payer: Self-pay

## 2021-10-25 ENCOUNTER — Encounter: Payer: Self-pay | Admitting: Urology

## 2021-10-25 VITALS — BP 157/82 | HR 86 | Temp 97.8°F

## 2021-10-25 DIAGNOSIS — R972 Elevated prostate specific antigen [PSA]: Secondary | ICD-10-CM | POA: Diagnosis not present

## 2021-10-25 LAB — URINALYSIS, ROUTINE W REFLEX MICROSCOPIC
Bilirubin, UA: NEGATIVE
Glucose, UA: NEGATIVE
Ketones, UA: NEGATIVE
Leukocytes,UA: NEGATIVE
Nitrite, UA: NEGATIVE
Protein,UA: NEGATIVE
RBC, UA: NEGATIVE
Specific Gravity, UA: 1.025 (ref 1.005–1.030)
Urobilinogen, Ur: 0.2 mg/dL (ref 0.2–1.0)
pH, UA: 5.5 (ref 5.0–7.5)

## 2021-10-25 NOTE — Progress Notes (Signed)
Urological Symptom Review  Patient is experiencing the following symptoms: Frequent urination Get up at night to urinate Weak stream   Review of Systems  Gastrointestinal (upper)  : Negative for upper GI symptoms  Gastrointestinal (lower) : Negative for lower GI symptoms  Constitutional : Negative for symptoms  Skin: Negative for skin symptoms  Eyes: Negative for eye symptoms  Ear/Nose/Throat : Negative for Ear/Nose/Throat symptoms  Hematologic/Lymphatic: Negative for Hematologic/Lymphatic symptoms  Cardiovascular : Leg swelling  Respiratory : Negative for respiratory symptoms  Endocrine: Negative for endocrine symptoms  Musculoskeletal: Joint pain  Neurological: Negative for neurological symptoms  Psychologic: Negative for psychiatric symptoms

## 2021-10-25 NOTE — Progress Notes (Signed)
10/25/2021 1:57 PM   David Dougherty [birth year] [id number]  Referring provider: Kathyrn Drown, MD [place],  [place] [address]  No chief complaint on file.    HPI:  New patient-  1) PSA elevation-David Dougherty has a rising PSA of July 2020 2.9, July 2021 3.6, September 2022 4.3, November 2022 7.2. No h/o BPH. No prior bx. No FH PCa. AUASS = 6.   He has ED and took Viagra.   He is on Coumadin for afib and CAD. This is managed by Dr. Hoy Register.   He retired from [employer] and now [employer] part time.     PMH: Past Medical History:  Diagnosis Date   Atrial fibrillation ([place])    CAD (coronary artery disease)    70% distal circumflex 11/2014    Dysrhythmia    AFib   GERD (gastroesophageal reflux disease)    Nonischemic cardiomyopathy ([facility])    Tobacco abuse    Snuff    Surgical History: Past Surgical History:  Procedure Laterality Date   APPENDECTOMY     CATARACT EXTRACTION W/PHACO Left 11/18/2019   Procedure: CATARACT EXTRACTION PHACO AND INTRAOCULAR LENS PLACEMENT ([place]);  Surgeon: [name], MD;  Location: [facility];  Service: Ophthalmology;  Laterality: Left;  CDE: 9.66   CATARACT EXTRACTION W/PHACO Right 12/04/2019   Procedure: CATARACT EXTRACTION PHACO AND INTRAOCULAR LENS PLACEMENT RIGHT EYE (CDE: 4.56);  Surgeon: [name], MD;  Location: [facility];  Service: Ophthalmology;  Laterality: Right;   COLONOSCOPY  2005   Neg   COLONOSCOPY N/A 01/05/2015   SLF:17 colon polyps removed/moderate sized hemorrhoids   COLONOSCOPY WITH PROPOFOL N/A 06/19/2018   Procedure: COLONOSCOPY WITH PROPOFOL;  Surgeon: Danie Binder, MD;  [facility];  Service: Endoscopy;  Laterality: N/A;  7:30am   ESOPHAGOGASTRODUODENOSCOPY N/A 01/05/2015   [id number] at gastro junction/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/small HH/mild erosive gastrtis and duodenitis   ESOPHAGOGASTRODUODENOSCOPY (EGD) WITH PROPOFOL N/A 06/19/2018   Procedure:  ESOPHAGOGASTRODUODENOSCOPY (EGD) WITH PROPOFOL;  Surgeon: Danie Binder, MD;  [facility];  Service: Endoscopy;  Laterality: N/A;   HEMORRHOID BANDING N/A 01/05/2015   Procedure: HEMORRHOID BANDING;  Surgeon: Danie Binder, MD;  [facility];  Service: Endoscopy;  Laterality: N/A;   LEFT AND RIGHT HEART CATHETERIZATION WITH CORONARY ANGIOGRAM N/A 12/03/2014   Procedure: LEFT AND RIGHT HEART CATHETERIZATION WITH CORONARY ANGIOGRAM;  Surgeon: Blane Ohara, MD;  Location: [facility] CATH LAB;  Service: Cardiovascular;  Laterality: N/A;   POLYPECTOMY  06/19/2018   Procedure: POLYPECTOMY;  Surgeon: Danie Binder, MD;  [facility];  Service: Endoscopy;;  rectal    SAVORY DILATION  06/19/2018   Procedure: SAVORY DILATION;  Surgeon: Danie Binder, MD;  [facility];  Service: Endoscopy;;   TONSILLECTOMY      Home Medications:  Allergies as of 10/25/2021       Reactions   Reglan [metoclopramide] Anaphylaxis        Medication List        Accurate as of October 25, 2021  1:57 PM. If you have any questions, ask your nurse or doctor.          atorvastatin 40 MG tablet Commonly known as: LIPITOR Take 1 tablet (40 mg total) by mouth daily.   calcium carbonate 500 MG chewable tablet Commonly known as: TUMS - dosed in mg elemental calcium Chew 2-3 tablets by mouth as needed for indigestion or heartburn.  carvedilol 3.125 MG tablet Commonly known as: COREG Take 1 tablet (3.125 mg total) by mouth 2 (two) times daily.   cyclobenzaprine 5 MG tablet Commonly known as: FLEXERIL 1 qhs prn What changed:  how much to take how to take this when to take this reasons to take this additional instructions   fluticasone 50 MCG/ACT nasal spray Commonly known as: Flonase Place 2 sprays into both nostrils daily.   furosemide 20 MG tablet Commonly known as: LASIX Take 1 tablet (20 mg total) by mouth daily.   HYDROcodone-acetaminophen 5-325 MG  tablet Commonly known as: NORCO/VICODIN 1 q6 prn caution drowsiness for infrequent use   lidocaine 5 % Commonly known as: Lidoderm Place 1 patch onto the skin daily. Remove & Discard patch within 12 hours or as directed by MD   losartan 25 MG tablet Commonly known as: COZAAR Take 1 tablet (25 mg total) by mouth daily.   nitroGLYCERIN 0.4 MG SL tablet Commonly known as: NITROSTAT PLACE 1 TAB UNDER TONGUE EVERY 5 MIN IF NEEDED FOR CHEST PAIN. MAY USE 3 TIMES.NO RELIEF CALL 911. What changed: See the new instructions.   omeprazole 20 MG capsule Commonly known as: PRILOSEC 1 qd   oxymetazoline 0.05 % nasal spray Commonly known as: AFRIN Place 1 spray into both nostrils 2 (two) times daily as needed for congestion.   rOPINIRole 1 MG tablet Commonly known as: REQUIP TAKE 1 TABLET BY MOUTH AT BEDTIME   sildenafil 100 MG tablet Commonly known as: Viagra Take 0.5-1 tablets (50-100 mg total) by mouth daily as needed for erectile dysfunction.   tiZANidine 2 MG tablet Commonly known as: ZANAFLEX Take 1 tablet (2 mg total) by mouth every 8 (eight) hours as needed for muscle spasms.   topiramate 50 MG tablet Commonly known as: TOPAMAX Take 1 tablet (50 mg total) by mouth 2 (two) times daily.   Vitamin D-3 125 MCG (5000 UT) Tabs Take 5,000 Units by mouth daily.   warfarin 5 MG tablet Commonly known as: COUMADIN Take as directed by the anticoagulation clinic. If you are unsure how to take this medication, talk to your nurse or doctor. Original instructions: TAKE 1 TO 2 TABLETS BY MOUTH ONCE DAILY BASED ON INR - DOSE CHANGE        Allergies:  Allergies  Allergen Reactions   Reglan [Metoclopramide] Anaphylaxis    Family History: Family History  Problem Relation Age of Onset   Heart attack Father    Hypertension Father    Diabetes Father    Colon cancer Neg Hx    Colon polyps Neg Hx     Social History:  reports that he quit smoking about 49 years ago. His smoking use  included cigars and cigarettes. He started smoking about 52 years ago. He has a 0.75 pack-year smoking history. He has quit using smokeless tobacco.  His smokeless tobacco use included snuff. He reports that he does not drink alcohol and does not use drugs.   Physical Exam: BP (!) 157/82   Pulse 86   Temp 97.8 F (36.6 C)   Constitutional:  Alert and oriented, No acute distress. HEENT: [place] AT, moist mucus membranes.  Trachea midline, no masses. Cardiovascular: No clubbing, cyanosis, or edema. Respiratory: Normal respiratory effort, no increased work of breathing. GI: Abdomen is soft, nontender, nondistended, no abdominal masses GU: No CVA tenderness Lymph: No cervical or inguinal lymphadenopathy. Skin: No rashes, bruises or suspicious lesions. Neurologic: Grossly intact, no focal deficits, moving all 4 extremities. Psychiatric:  Normal mood and affect. DRE: Prostate 50 g, smooth without hard area or nodule   Laboratory Data: Lab Results  Component Value Date   WBC 5.2 08/17/2021   HGB 14.5 08/17/2021   HCT 45.2 08/17/2021   MCV 88 08/17/2021   PLT 196 08/17/2021    Lab Results  Component Value Date   CREATININE 1.07 08/17/2021    Lab Results  Component Value Date   PSA 4.00 11/20/2014   PSA 4.60 (H) 11/13/2014    No results found for: TESTOSTERONE  Lab Results  Component Value Date   HGBA1C 6.0 (H) 04/15/2020    Urinalysis    Component Value Date/Time   COLORURINE COLORLESS (A) 04/15/2020 1354   APPEARANCEUR CLEAR 04/15/2020 1354   LABSPEC 1.019 04/15/2020 1354   PHURINE 7.0 04/15/2020 [place] 04/15/2020 1354   [place] 04/15/2020 [place] 04/15/2020 [place] 04/15/2020 [place] 04/15/2020 1354   NITRITE NEGATIVE 04/15/2020 1354   LEUKOCYTESUR NEGATIVE 04/15/2020 1354    No results found for: LABMICR, WBCUA, RBCUA, LABEPIT, MUCUS, BACTERIA  Pertinent  Imaging: N/a   Assessment & Plan:    1. Elevated PSA I had a long discussion with the patient on the nature of elevated PSA - benign vs malignant causes. We discussed age specific levels and that PCa can be seen on a biopsy with very low PSA levels (<=2.5). We discussed the nature risks and benefits of continued surveillance, other lab tests, imaging as well as prostate biopsy. We discussed the management of prostate cancer might include active surveillance or treatment depending on biopsy findings. All questions answered. He does not want to come off coumadin for biopsy and his exam is benign. Also his PSAD likely normal. He will proceed with MRI.   - Urinalysis, Routine w reflex microscopic   No follow-ups on file.  Festus Aloe, MD  [facility]  [address] [place], [place] [address] [phone]

## 2021-11-09 ENCOUNTER — Other Ambulatory Visit (INDEPENDENT_AMBULATORY_CARE_PROVIDER_SITE_OTHER): Payer: Medicare Other

## 2021-11-09 ENCOUNTER — Other Ambulatory Visit: Payer: Self-pay

## 2021-11-09 DIAGNOSIS — Z7901 Long term (current) use of anticoagulants: Secondary | ICD-10-CM

## 2021-11-09 LAB — POCT INR: INR: 1.9 — AB (ref 2.0–3.0)

## 2021-11-09 NOTE — Patient Instructions (Signed)
Take 2 tablets po every day except Saturday. On Saturdays take 1 1/2 tablet. Recheck in 2 weeks.

## 2021-11-10 ENCOUNTER — Ambulatory Visit (HOSPITAL_COMMUNITY)
Admission: RE | Admit: 2021-11-10 | Discharge: 2021-11-10 | Disposition: A | Discharge status: Home / Self Care | Payer: Medicare Other | Source: Ambulatory Visit | Attending: Urology | Admitting: Urology

## 2021-11-10 DIAGNOSIS — R972 Elevated prostate specific antigen [PSA]: Secondary | ICD-10-CM | POA: Insufficient documentation

## 2021-11-10 MED ORDER — GADOBUTROL 1 MMOL/ML IV SOLN
10.0000 mL | Freq: Once | INTRAVENOUS | Status: AC | PRN
  Administered 2021-11-10: 10 mL via INTRAVENOUS

## 2021-11-21 ENCOUNTER — Other Ambulatory Visit: Payer: Self-pay | Admitting: Family Medicine

## 2021-11-23 ENCOUNTER — Other Ambulatory Visit: Payer: Self-pay

## 2021-11-23 ENCOUNTER — Other Ambulatory Visit (INDEPENDENT_AMBULATORY_CARE_PROVIDER_SITE_OTHER): Payer: Medicare Other

## 2021-11-23 DIAGNOSIS — Z7901 Long term (current) use of anticoagulants: Secondary | ICD-10-CM | POA: Diagnosis not present

## 2021-11-23 LAB — POCT INR: INR: 3 (ref 2.0–3.0)

## 2021-12-06 ENCOUNTER — Other Ambulatory Visit: Payer: Self-pay | Admitting: Family Medicine

## 2021-12-07 ENCOUNTER — Other Ambulatory Visit (INDEPENDENT_AMBULATORY_CARE_PROVIDER_SITE_OTHER): Payer: Medicare Other

## 2021-12-07 ENCOUNTER — Other Ambulatory Visit: Payer: Self-pay

## 2021-12-07 DIAGNOSIS — Z7901 Long term (current) use of anticoagulants: Secondary | ICD-10-CM

## 2021-12-07 LAB — POCT INR: INR: 2.6 (ref 2.0–3.0)

## 2021-12-07 NOTE — Patient Instructions (Signed)
Continue same treatment. Return in 4 weeks

## 2021-12-28 ENCOUNTER — Telehealth: Payer: Self-pay

## 2021-12-28 NOTE — Telephone Encounter (Signed)
Called patient to schedule f/u appt. No answer.  Left message to return call to office.

## 2021-12-28 NOTE — Telephone Encounter (Signed)
-----   Message from Festus Aloe, MD sent at 12/28/2021 10:54 AM EST ----- David Dougherty should see me back next available appointment with a PSA prior.  Thank you!  ----- Message ----- From: Dorisann Frames, RN Sent: 12/24/2021  10:50 AM EST To: Festus Aloe, MD  Hey Dr. Johnette Abraham.  Patient MRI results are back. Do you want him to come back to have an appt to see you?

## 2022-01-04 ENCOUNTER — Other Ambulatory Visit (INDEPENDENT_AMBULATORY_CARE_PROVIDER_SITE_OTHER): Payer: Medicare Other

## 2022-01-04 ENCOUNTER — Other Ambulatory Visit: Payer: Self-pay

## 2022-01-04 DIAGNOSIS — Z7901 Long term (current) use of anticoagulants: Secondary | ICD-10-CM

## 2022-01-04 LAB — POCT INR: INR: 2.7 (ref 2.0–3.0)

## 2022-01-04 NOTE — Patient Instructions (Signed)
Continue same treatment. Return in 4 weeks for recheck

## 2022-01-06 ENCOUNTER — Other Ambulatory Visit: Payer: Self-pay | Admitting: Family Medicine

## 2022-01-06 MED ORDER — WARFARIN SODIUM 5 MG PO TABS: ORAL_TABLET | ORAL | 1 refills | Status: DC

## 2022-01-06 MED ORDER — TOPIRAMATE 50 MG PO TABS: 50.0000 mg | ORAL_TABLET | Freq: Two times a day (BID) | ORAL | 1 refills | Status: DC

## 2022-01-10 NOTE — Telephone Encounter (Signed)
Unable to reach by phone. Letter mailed to call office to schedule office visit.

## 2022-01-18 ENCOUNTER — Ambulatory Visit: Payer: Medicare Other | Admitting: Family Medicine

## 2022-01-26 ENCOUNTER — Telehealth: Payer: Self-pay | Admitting: *Deleted

## 2022-01-26 ENCOUNTER — Telehealth: Payer: Self-pay | Admitting: Family Medicine

## 2022-01-26 NOTE — Telephone Encounter (Signed)
°  Left message for patient to call back and schedule Medicare Annual Wellness Visit (AWV) in office.   If unable to come into the office for AWV,  please offer to do virtually or by telephone.  No hx of AWV eligible for AWVI as of 10/05/2016 per [facility]  Please schedule at anytime with [facility]Nurse Health Advisor.      40 Minutes appointment   Any questions, please call me at [phone]

## 2022-01-26 NOTE — Chronic Care Management (AMB) (Signed)
Chronic Care Management   Note  01/26/2022 Name: David Dougherty MRN: [id number] DOB: February 13, 1951  David Dougherty is a 71 y.o. year old male who is a primary care patient of Luking, Elayne Snare, MD. I reached out to Morgan by phone today in response to a referral sent by David Dougherty's PCP.  David Dougherty was given information about Chronic Care Management services today including:  CCM service includes personalized support from designated clinical staff supervised by his physician, including individualized plan of care and coordination with other care providers 24/7 contact phone numbers for assistance for urgent and routine care needs. Service will only be billed when office clinical staff spend 20 minutes or more in a month to coordinate care. Only one practitioner may furnish and bill the service in a calendar month. The patient may stop CCM services at any time (effective at the end of the month) by phone call to the office staff. The patient is responsible for co-pay (up to 20% after annual deductible is met) if co-pay is required by the individual health plan.   Patient agreed to services and verbal consent obtained.   Follow up plan: Telephone appointment with care management team member scheduled for:03/09/22  [facility]  Direct Dial: [phone]

## 2022-02-01 ENCOUNTER — Other Ambulatory Visit: Payer: Self-pay

## 2022-02-01 ENCOUNTER — Other Ambulatory Visit (INDEPENDENT_AMBULATORY_CARE_PROVIDER_SITE_OTHER): Payer: Medicare Other

## 2022-02-01 DIAGNOSIS — Z7901 Long term (current) use of anticoagulants: Secondary | ICD-10-CM

## 2022-02-01 LAB — POCT INR: INR: 2.3 (ref 2.0–3.0)

## 2022-02-01 NOTE — Patient Instructions (Signed)
Continue same treatment; recheck in 4 weeks.

## 2022-03-01 ENCOUNTER — Other Ambulatory Visit (INDEPENDENT_AMBULATORY_CARE_PROVIDER_SITE_OTHER): Payer: Medicare Other | Admitting: *Deleted

## 2022-03-01 DIAGNOSIS — Z7901 Long term (current) use of anticoagulants: Secondary | ICD-10-CM | POA: Diagnosis not present

## 2022-03-01 LAB — POCT INR: INR: 2.2 (ref 2.0–3.0)

## 2022-03-08 ENCOUNTER — Ambulatory Visit (INDEPENDENT_AMBULATORY_CARE_PROVIDER_SITE_OTHER): Payer: Medicare Other

## 2022-03-08 VITALS — Ht 73.0 in | Wt 214.0 lb

## 2022-03-08 DIAGNOSIS — Z0000 Encounter for general adult medical examination without abnormal findings: Secondary | ICD-10-CM | POA: Diagnosis not present

## 2022-03-08 NOTE — Patient Instructions (Signed)
David Dougherty , ?Thank you for taking time to come for your Medicare Wellness Visit. I appreciate your ongoing commitment to your health goals. Please review the following plan we discussed and let me know if I can assist you in the future.  ? ?Screening recommendations/referrals: ?Colonoscopy: Done 06/09/2018. Repeat in 5 years ? ?Recommended yearly ophthalmology/optometry visit for glaucoma screening and checkup ?Recommended yearly dental visit for hygiene and checkup ? ?Vaccinations: ?Influenza vaccine: Declined.  ?Pneumococcal vaccine: Done 06/23/2016, 11/12/2020 and 04/10/2015. ?Tdap vaccine: Due Repeat in 10 years ? ?Shingles vaccine: Discussed.   ?Covid-19: Done 03/09/2020 and 02/10/2020. ? ?Advanced directives: Please bring a copy of your health care power of attorney and living will to the office to be added to your chart at your convenience. ? ? ?Conditions/risks identified: Aim for 30 minutes of exercise or brisk walking, 6-8 glasses of water, and 5 servings of fruits and vegetables each day. ? ? ?Next appointment: Follow up in one year for your annual wellness visit. 2024. ? ?Preventive Care 42 Years and Older, Male ? ?Preventive care refers to lifestyle choices and visits with your health care provider that can promote health and wellness. ?What does preventive care include? ?A yearly physical exam. This is also called an annual well check. ?Dental exams once or twice a year. ?Routine eye exams. Ask your health care provider how often you should have your eyes checked. ?Personal lifestyle choices, including: ?Daily care of your teeth and gums. ?Regular physical activity. ?Eating a healthy diet. ?Avoiding tobacco and drug use. ?Limiting alcohol use. ?Practicing safe sex. ?Taking low doses of aspirin every day. ?Taking vitamin and mineral supplements as recommended by your health care provider. ?What happens during an annual well check? ?The services and screenings done by your health care provider during your annual  well check will depend on your age, overall health, lifestyle risk factors, and family history of disease. ?Counseling  ?Your health care provider may ask you questions about your: ?Alcohol use. ?Tobacco use. ?Drug use. ?Emotional well-being. ?Home and relationship well-being. ?Sexual activity. ?Eating habits. ?History of falls. ?Memory and ability to understand (cognition). ?Work and work Statistician. ?Screening  ?You may have the following tests or measurements: ?Height, weight, and BMI. ?Blood pressure. ?Lipid and cholesterol levels. These may be checked every 5 years, or more frequently if you are over 65 years old. ?Skin check. ?Lung cancer screening. You may have this screening every year starting at age 85 if you have a 30-pack-year history of smoking and currently smoke or have quit within the past 15 years. ?Fecal occult blood test (FOBT) of the stool. You may have this test every year starting at age 57. ?Flexible sigmoidoscopy or colonoscopy. You may have a sigmoidoscopy every 5 years or a colonoscopy every 10 years starting at age 59. ?Prostate cancer screening. Recommendations will vary depending on your family history and other risks. ?Hepatitis C blood test. ?Hepatitis B blood test. ?Sexually transmitted disease (STD) testing. ?Diabetes screening. This is done by checking your blood sugar (glucose) after you have not eaten for a while (fasting). You may have this done every 1-3 years. ?Abdominal aortic aneurysm (AAA) screening. You may need this if you are a current or former smoker. ?Osteoporosis. You may be screened starting at age 4 if you are at high risk. ?Talk with your health care provider about your test results, treatment options, and if necessary, the need for more tests. ?Vaccines  ?Your health care provider may recommend certain vaccines, such as: ?  Influenza vaccine. This is recommended every year. ?Tetanus, diphtheria, and acellular pertussis (Tdap, Td) vaccine. You may need a Td booster  every 10 years. ?Zoster vaccine. You may need this after age 20. ?Pneumococcal 13-valent conjugate (PCV13) vaccine. One dose is recommended after age 43. ?Pneumococcal polysaccharide (PPSV23) vaccine. One dose is recommended after age 43. ?Talk to your health care provider about which screenings and vaccines you need and how often you need them. ?This information is not intended to replace advice given to you by your health care provider. Make sure you discuss any questions you have with your health care provider. ?Document Released: 12/18/2015 Document Revised: 08/10/2016 Document Reviewed: 09/22/2015 ?Elsevier Interactive Patient Education ? 2017 [place]. ? ?Fall Prevention in the Home ?Falls can cause injuries. They can happen to people of all ages. There are many things you can do to make your home safe and to help prevent falls. ?What can I do on the outside of my home? ?Regularly fix the edges of walkways and driveways and fix any cracks. ?Remove anything that might make you trip as you walk through a door, such as a raised step or threshold. ?Trim any bushes or trees on the path to your home. ?Use bright outdoor lighting. ?Clear any walking paths of anything that might make someone trip, such as rocks or tools. ?Regularly check to see if handrails are loose or broken. Make sure that both sides of any steps have handrails. ?Any raised decks and porches should have guardrails on the edges. ?Have any leaves, snow, or ice cleared regularly. ?Use sand or salt on walking paths during winter. ?Clean up any spills in your garage right away. This includes oil or grease spills. ?What can I do in the bathroom? ?Use night lights. ?Install grab bars by the toilet and in the tub and shower. Do not use towel bars as grab bars. ?Use non-skid mats or decals in the tub or shower. ?If you need to sit down in the shower, use a plastic, non-slip stool. ?Keep the floor dry. Clean up any water that spills on the floor as soon  as it happens. ?Remove soap buildup in the tub or shower regularly. ?Attach bath mats securely with double-sided non-slip rug tape. ?Do not have throw rugs and other things on the floor that can make you trip. ?What can I do in the bedroom? ?Use night lights. ?Make sure that you have a light by your bed that is easy to reach. ?Do not use any sheets or blankets that are too big for your bed. They should not hang down onto the floor. ?Have a firm chair that has side arms. You can use this for support while you get dressed. ?Do not have throw rugs and other things on the floor that can make you trip. ?What can I do in the kitchen? ?Clean up any spills right away. ?Avoid walking on wet floors. ?Keep items that you use a lot in easy-to-reach places. ?If you need to reach something above you, use a strong step stool that has a grab bar. ?Keep electrical cords out of the way. ?Do not use floor polish or wax that makes floors slippery. If you must use wax, use non-skid floor wax. ?Do not have throw rugs and other things on the floor that can make you trip. ?What can I do with my stairs? ?Do not leave any items on the stairs. ?Make sure that there are handrails on both sides of the stairs and use them.  Fix handrails that are broken or loose. Make sure that handrails are as long as the stairways. ?Check any carpeting to make sure that it is firmly attached to the stairs. Fix any carpet that is loose or worn. ?Avoid having throw rugs at the top or bottom of the stairs. If you do have throw rugs, attach them to the floor with carpet tape. ?Make sure that you have a light switch at the top of the stairs and the bottom of the stairs. If you do not have them, ask someone to add them for you. ?What else can I do to help prevent falls? ?Wear shoes that: ?Do not have high heels. ?Have rubber bottoms. ?Are comfortable and fit you well. ?Are closed at the toe. Do not wear sandals. ?If you use a stepladder: ?Make sure that it is fully  opened. Do not climb a closed stepladder. ?Make sure that both sides of the stepladder are locked into place. ?Ask someone to hold it for you, if possible. ?Clearly mark and make sure that you can see:

## 2022-03-08 NOTE — Progress Notes (Signed)
? ?Subjective:  ? David Dougherty is a 71 y.o. male who presents for an Initial Medicare Annual Wellness Visit. ?Virtual Visit via Telephone Note ? ?I connected with  David Dougherty on 03/08/22 at  2:15 PM EDT by telephone and verified that I am speaking with the correct person using two identifiers. ? ?Location: ?Patient: HOME ?Provider: [facility] ?Persons participating in the virtual visit: patient/Nurse Health Advisor ?  ?I discussed the limitations, risks, security and privacy concerns of performing an evaluation and management service by telephone and the availability of in person appointments. The patient expressed understanding and agreed to proceed. ? ?Interactive audio and video telecommunications were attempted between this nurse and patient, however failed, due to patient having technical difficulties OR patient did not have access to video capability.  We continued and completed visit with audio only. ? ?Some vital signs may be absent or patient reported.  ? ?Chriss Driver, LPN ? ?Review of Systems    ? ?Cardiac Risk Factors include: advanced age (>48mn, >>52women);hypertension;dyslipidemia;male gender;sedentary lifestyle ? ?   ?Objective:  ?  ?Today's Vitals  ? 03/08/22 1419  ?Weight: 214 lb (97.1 kg)  ?Height: '6\' 1"'$  (1.854 m)  ? ?Body mass index is 28.23 kg/m?. ? ? ?  03/08/2022  ?  2:26 PM 07/15/2021  ?  1:22 PM 05/09/2021  ?  1:56 PM 04/15/2020  ?  5:29 PM 04/15/2020  ?  1:50 PM 12/04/2019  ?  7:44 AM 11/18/2019  ?  9:38 AM  ?Advanced Directives  ?Does Patient Have a Medical Advance Directive? No No No No No No No  ?Would patient like information on creating a medical advance directive? No - Patient declined   No - Patient declined   No - Patient declined  ? ? ?Current Medications (verified) ?Outpatient Encounter Medications as of 03/08/2022  ?Medication Sig  ? atorvastatin (LIPITOR) 40 MG tablet Take 1 tablet (40 mg total) by mouth daily.  ? calcium carbonate (TUMS - DOSED IN MG ELEMENTAL CALCIUM)  500 MG chewable tablet Chew 2-3 tablets by mouth as needed for indigestion or heartburn.   ? carvedilol (COREG) 3.125 MG tablet Take 1 tablet (3.125 mg total) by mouth 2 (two) times daily.  ? Cholecalciferol (VITAMIN D-3) 125 MCG (5000 UT) TABS Take 5,000 Units by mouth daily.  ? cyclobenzaprine (FLEXERIL) 5 MG tablet 1 qhs prn (Patient taking differently: Take 5 mg by mouth at bedtime as needed for muscle spasms.)  ? fluticasone (FLONASE) 50 MCG/ACT nasal spray Place 2 sprays into both nostrils daily.  ? furosemide (LASIX) 20 MG tablet Take 1 tablet (20 mg total) by mouth daily.  ? HYDROcodone-acetaminophen (NORCO/VICODIN) 5-325 MG tablet 1 q6 prn caution drowsiness for infrequent use  ? lidocaine (LIDODERM) 5 % Place 1 patch onto the skin daily. Remove & Discard patch within 12 hours or as directed by MD  ? losartan (COZAAR) 25 MG tablet Take 1 tablet (25 mg total) by mouth daily.  ? nitroGLYCERIN (NITROSTAT) 0.4 MG SL tablet PLACE 1 TAB UNDER TONGUE EVERY 5 MIN IF NEEDED FOR CHEST PAIN. MAY USE 3 TIMES.NO RELIEF CALL 911. (Patient taking differently: Place 0.4 mg under the tongue every 5 (five) minutes as needed for chest pain.)  ? omeprazole (PRILOSEC) 20 MG capsule 1 qd  ? oxymetazoline (AFRIN) 0.05 % nasal spray Place 1 spray into both nostrils 2 (two) times daily as needed for congestion.  ? rOPINIRole (REQUIP) 1 MG tablet TAKE 1 TABLET BY  MOUTH AT BEDTIME  ? tiZANidine (ZANAFLEX) 2 MG tablet Take 1 tablet (2 mg total) by mouth every 8 (eight) hours as needed for muscle spasms.  ? topiramate (TOPAMAX) 50 MG tablet Take 1 tablet (50 mg total) by mouth 2 (two) times daily.  ? warfarin (COUMADIN) 5 MG tablet TAKE 1 TO 2 TABLETS BY MOUTH ONCE DAILY BASED ON INR - DOSE CHANGE  ? ?No facility-administered encounter medications on file as of 03/08/2022.  ? ? ?Allergies (verified) ?Reglan [metoclopramide]  ? ?History: ?Past Medical History:  ?Diagnosis Date  ? Atrial fibrillation ([place])   ? CAD (coronary artery disease)    ? 70% distal circumflex 11/2014   ? Dysrhythmia   ? AFib  ? GERD (gastroesophageal reflux disease)   ? Nonischemic cardiomyopathy ([place])   ? Tobacco abuse   ? Snuff  ? ?Past Surgical History:  ?Procedure Laterality Date  ? APPENDECTOMY    ? CATARACT EXTRACTION W/PHACO Left 11/18/2019  ? Procedure: CATARACT EXTRACTION PHACO AND INTRAOCULAR LENS PLACEMENT (IOC);  Surgeon: [name], MD;  Location: [facility];  Service: Ophthalmology;  Laterality: Left;  CDE: 9.66  ? CATARACT EXTRACTION W/PHACO Right 12/04/2019  ? Procedure: CATARACT EXTRACTION PHACO AND INTRAOCULAR LENS PLACEMENT RIGHT EYE (CDE: 4.56);  Surgeon: [name], MD;  Location: [facility];  Service: Ophthalmology;  Laterality: Right;  ? COLONOSCOPY  2005  ? Neg  ? COLONOSCOPY N/A 01/05/2015  ? SLF:17 colon polyps removed/moderate sized hemorrhoids  ? COLONOSCOPY WITH PROPOFOL N/A 06/19/2018  ? Procedure: COLONOSCOPY WITH PROPOFOL;  Surgeon: Danie Binder, MD;  Location: [facility];  Service: Endoscopy;  Laterality: N/A;  7:30am  ? ESOPHAGOGASTRODUODENOSCOPY N/A 01/05/2015  ? 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 ESOPHAGOGASTRODUODENOSCOPY (EGD) WITH PROPOFOL N/A 06/19/2018  ? Procedure: ESOPHAGOGASTRODUODENOSCOPY (EGD) WITH PROPOFOL;  Surgeon: Danie Binder, MD;  Location: [facility];  Service: Endoscopy;  Laterality: N/A;  ? HEMORRHOID BANDING N/A 01/05/2015  ? Procedure: HEMORRHOID BANDING;  Surgeon: Danie Binder, MD;  Location: [facility];  Service: Endoscopy;  Laterality: N/A;  ? LEFT AND RIGHT HEART CATHETERIZATION WITH CORONARY ANGIOGRAM N/A 12/03/2014  ? Procedure: LEFT AND RIGHT HEART CATHETERIZATION WITH CORONARY ANGIOGRAM;  Surgeon: Blane Ohara, MD;  Location[facility] C[facility];  Service: Cardiovascular;  Laterality: N/A;  ? POLYPECTOMY  06/19/2018  ? Procedure: POLYPECTOMY;  Surgeon: Danie Binder, MD;  Location: [facility];  Service: Endoscopy;;  rectal ?  ?  SAVORY DILATION  06/19/2018  ? Procedure: SAVORY DILATION;  Surgeon: Danie Binder, MD;  Location: [facility];  Service: Endoscopy;;  ? TONSILLECTOMY    ? ?Family History  ?Problem Relation Age of Onset  ? Heart attack Father   ? Hypertension Father   ? Diabetes Father   ? Colon cancer Neg Hx   ? Colon polyps Neg Hx   ? ?Social History  ? ?Socioeconomic History  ? Marital status: Married  ?  Spouse name: Not on file  ? Number of children: Not on file  ? Years of education: Not on file  ? Highest education level: Not on file  ?Occupational History  ? Occupation: Camera operator  ?Tobacco Use  ? Smoking status: Former  ?  Packs/day: 0.25  ?  Years: 3.00  ?  Pack years: 0.75  ?  Types: Cigars, Cigarettes  ?  Start date: 10/19/1969  ?  Quit date: 10/22/1972  ?  Years since quitting:  49.4  ? Smokeless tobacco: Former  ?  Types: Snuff  ?Vaping Use  ? Vaping Use: Never used  ?Substance and Sexual Activity  ? Alcohol use: No  ?  Alcohol/week: 0.0 standard drinks  ? Drug use: No  ? Sexual activity: Yes  ?Other Topics Concern  ? Not on file  ?Social History Narrative  ? Married with 5 children  ? No regular exercise  ? ?Social Determinants of Health  ? ?Financial Resource Strain: Low Risk   ? Difficulty of Paying Living Expenses: Not hard at all  ?Food Insecurity: No Food Insecurity  ? Worried About Charity fundraiser in the Last Year: Never true  ? Ran Out of Food in the Last Year: Never true  ?Transportation Needs: No Transportation Needs  ? Lack of Transportation (Medical): No  ? Lack of Transportation (Non-Medical): No  ?Physical Activity: Sufficiently Active  ? Days of Exercise per Week: 5 days  ? Minutes of Exercise per Session: 30 min  ?Stress: No Stress Concern Present  ? Feeling of Stress : Not at all  ?Social Connections: Socially Integrated  ? Frequency of Communication with Friends and Family: More than three times a week  ? Frequency of Social Gatherings with Friends and Family: More than  three times a week  ? Attends Religious Services: More than 4 times per year  ? Active Member of Clubs or Organizations: Yes  ? Attends Archivist Meetings: More than 4 times per year  ? Marital Stat

## 2022-03-09 ENCOUNTER — Other Ambulatory Visit: Payer: Self-pay | Admitting: Family Medicine

## 2022-03-09 ENCOUNTER — Telehealth: Payer: Self-pay | Admitting: Family Medicine

## 2022-03-09 ENCOUNTER — Ambulatory Visit (INDEPENDENT_AMBULATORY_CARE_PROVIDER_SITE_OTHER): Payer: Medicare Other | Admitting: *Deleted

## 2022-03-09 DIAGNOSIS — I10 Essential (primary) hypertension: Secondary | ICD-10-CM

## 2022-03-09 DIAGNOSIS — E7849 Other hyperlipidemia: Secondary | ICD-10-CM

## 2022-03-09 NOTE — Telephone Encounter (Signed)
Nurses-please send in 2 refills for sublingual nitroglycerin to [facility] per patient request thank you ?

## 2022-03-09 NOTE — Patient Instructions (Signed)
Visit Information ? ?Thank you for taking time to visit with me today. Please don't hesitate to contact me if I can be of assistance to you before our next scheduled telephone appointment. ? ?Following are the goals we discussed today:  ?Take medications as prescribed   ?Attend all scheduled provider appointments ?Call pharmacy for medication refills 3-7 days in advance of running out of medications ?Perform all self care activities independently  ?Perform IADL's (shopping, preparing meals, housekeeping, managing finances) independently ?Call provider office for new concerns or questions  ?check blood pressure weekly ?choose a place to take my blood pressure (home, clinic or office, retail store) ?write blood pressure results in a log or diary ?keep a blood pressure log ?take blood pressure log to all doctor appointments ?keep all doctor appointments ?take medications for blood pressure exactly as prescribed ?eat more whole grains, fruits and vegetables, lean meats and healthy fats ?take all medications exactly as prescribed ?call doctor with any symptoms you believe are related to your medicine ?call doctor when you experience any new symptoms ?go to all doctor appointments as scheduled ?adhere to prescribed diet: heart healthy and low sodium ?Continue getting outdoors, working in your shop and walking ?Read labels for sodium content ?Check at your pharmacy for new prescription nitroglycerin ?Look over education sent via my chart- low sodium and heart healthy diet ? ?Our next appointment is by telephone on 06/01/22 at 945 am ? ?Please call the care guide team at [phone] if you need to cancel or reschedule your appointment.  ? ?If you are experiencing a Mental Health or [place] or need someone to talk to, please call the Suicide and Crisis Lifeline: 988 ?call the [place] National Suicide Prevention Lifeline: 507-614-9203 or TTY: 207 179 0681 TTY 343 412 0274) to talk to a trained counselor ?call  1-800-273-TALK (toll free, 24 hour hotline) ?go to [facility] [address] [place] [phone]) ?call 911  ? ?Following is a copy of your full plan of care:  ?Care Plan : [place] of Care  ?Updates made by Kassie Mends, RN since 03/09/2022 12:00 AM  ?  ? ?Problem: No plan of care established for management of chronic disease state  (HTN, HLD)   ?Priority: High  ?  ? ?Long-Range Goal: Development of plan of care for chronic disease management  (HTN, HLD)   ?Start Date: 03/09/2022  ?Expected End Date: 09/05/2022  ?Priority: High  ?Note:   ?Current Barriers:  ?Knowledge Deficits related to plan of care for management of HTN and HLD  ?Patient reports lives with spouse, is independent in all aspects of his care, continues to drive, checks blood pressure on occasion, tries to eat healthy, gets outdoors and walks some for exercise. Reports nitroglycerin prescription is expired. ? ?RNCM Clinical Goal(s):  ?Patient will verbalize understanding of plan for management of HTN and HLD as evidenced by patient report, review of EHR and  through collaboration with RN Care manager, provider, and care team.  ? ?Interventions: ?1:1 collaboration with primary care provider regarding development and update of comprehensive plan of care as evidenced by provider attestation and co-signature ?Inter-disciplinary care team collaboration (see longitudinal plan of care) ?Evaluation of current treatment plan related to  self management and patient's adherence to plan as established by provider ? ? ?Hyperlipidemia:  (Status: New goal. Goal on Track (progressing): YES.) Long Term Goal  ?Lab Results  ?Component Value Date  ? CHOL 105 08/17/2021  ? HDL 30 (L)  08/17/2021  ? [place] 54 08/17/2021  ? TRIG 111 08/17/2021  ? CHOLHDL 3.5 08/17/2021  ?  ? ?Medication review performed; medication list updated in electronic medical record.  ?Provider established cholesterol goals reviewed; ?Counseled on  importance of regular laboratory monitoring as prescribed; ?Provided HLD educational materials; ?Reviewed role and benefits of statin for ASCVD risk reduction; ?Reviewed importance of limiting foods high in cholesterol; ?Screening for signs and symptoms of depression related to chronic disease state;  ?Assessed social determinant of health barriers;  ?Pain assessment completed ?Education via My chart- heart healthy diet ? ? ?Hypertension Interventions:  (Status:  New goal. and Goal on track:  Yes.) Long Term Goal ?Last practice recorded BP readings:  ?BP Readings from Last 3 Encounters:  ?10/25/21 (!) 157/82  ?09/16/21 138/84  ?07/15/21 (!) 154/102  ?Most recent eGFR/CrCl:  ?Lab Results  ?Component Value Date  ? EGFR 75 08/17/2021  ?  No components found for: CRCL ? ?Evaluation of current treatment plan related to hypertension self management and patient's adherence to plan as established by provider ?Reviewed medications with patient and discussed importance of compliance ?Counseled on the importance of exercise goals with target of 150 minutes per week ?Discussed plans with patient for ongoing care management follow up and provided patient with direct contact information for care management team ?Discussed complications of poorly controlled blood pressure such as heart disease, stroke, circulatory complications, vision complications, kidney impairment, sexual dysfunction  ?Education via My chart- low sodium diet ?In basket sent to primary care provider requesting new prescription for nitroglycerin (current script is expired) ? ?Patient Goals/Self-Care Activities: ?Take medications as prescribed   ?Attend all scheduled provider appointments ?Call pharmacy for medication refills 3-7 days in advance of running out of medications ?Perform all self care activities independently  ?Perform IADL's (shopping, preparing meals, housekeeping, managing finances) independently ?Call provider office for new concerns or questions   ?check blood pressure weekly ?choose a place to take my blood pressure (home, clinic or office, retail store) ?write blood pressure results in a log or diary ?keep a blood pressure log ?take blood pressure log to all doctor appointments ?keep all doctor appointments ?take medications for blood pressure exactly as prescribed ?eat more whole grains, fruits and vegetables, lean meats and healthy fats ?- take all medications exactly as prescribed ?- call doctor with any symptoms you believe are related to your medicine ?- call doctor when you experience any new symptoms ?- go to all doctor appointments as scheduled ?- adhere to prescribed diet: heart healthy and low sodium ?Continue getting outdoors, working in your shop and walking ?Read labels for sodium content ?Check at your pharmacy for new prescription nitroglycerin ?Look over education sent via my chart- low sodium and heart healthy diet ? ? ?  ? ? ?David Dougherty was given information about Care Management services by the embedded care coordination team including:  ?Care Management services include personalized support from designated clinical staff supervised by his physician, including individualized plan of care and coordination with other care providers ?24/7 contact phone numbers for assistance for urgent and routine care needs. ?The patient may stop CCM services at any time (effective at the end of the month) by phone call to the office staff. ? ?Patient agreed to services and verbal consent obtained.  ? ?Patient verbalizes understanding of instructions and care plan provided today and agrees to view in [place]. Active MyChart status confirmed with patient.   ? ?Telephone follow up appointment with care management team member  scheduled for: 06/01/22 ? ?Jacqlyn Larsen RNC, BSN ?RN Case Manager ?[place] ?[phone] ? ?Heart-Healthy Eating Plan ?Heart-healthy meal planning includes: ?Eating less unhealthy fats. ?Eating more healthy fats. ?Making  other changes in your diet. ?Talk with your doctor or a diet specialist (dietitian) to create an eating plan that is right for you. ?What is my plan? ?Your doctor may recommend an eating plan that

## 2022-03-09 NOTE — Chronic Care Management (AMB) (Signed)
? Care Management ?  ? RN Visit Note ? ?03/09/2022 ?Name: David Dougherty MRN: [id number] DOB: 1951/11/14 ? ?Subjective: ?David Dougherty is a 71 y.o. year old male who is a primary care patient of Luking, Elayne Snare, MD. The care management team was consulted for assistance with disease management and care coordination needs.   ? ?Engaged with patient by telephone for initial visit in response to provider referral for case management and/or care coordination services.  ? ?Consent to Services:  ? David Dougherty was given information about Care Management services today including:  ?Care Management services includes personalized support from designated clinical staff supervised by his physician, including individualized plan of care and coordination with other care providers ?24/7 contact phone numbers for assistance for urgent and routine care needs. ?The patient may stop case management services at any time by phone call to the office staff. ? ?Patient agreed to services and consent obtained.  ? ?Assessment: Review of patient past medical history, allergies, medications, health status, including review of consultants reports, laboratory and other test data, was performed as part of comprehensive evaluation and provision of chronic care management services.  ? ?SDOH (Social Determinants of Health) assessments and interventions performed:  ?SDOH Interventions   ? ?Flowsheet Row Most Recent Value  ?SDOH Interventions   ?Food Insecurity Interventions Intervention Not Indicated  ?Transportation Interventions Intervention Not Indicated  ? ?  ?  ? ?Care Plan ? ?Allergies  ?Allergen Reactions  ? Reglan [Metoclopramide] Anaphylaxis  ? ? ?Outpatient Encounter Medications as of 03/09/2022  ?Medication Sig  ? atorvastatin (LIPITOR) 40 MG tablet Take 1 tablet (40 mg total) by mouth daily.  ? calcium carbonate (TUMS - DOSED IN MG ELEMENTAL CALCIUM) 500 MG chewable tablet Chew 2-3 tablets by mouth as needed for indigestion or  heartburn.   ? carvedilol (COREG) 3.125 MG tablet Take 1 tablet (3.125 mg total) by mouth 2 (two) times daily.  ? Cholecalciferol (VITAMIN D-3) 125 MCG (5000 UT) TABS Take 5,000 Units by mouth daily.  ? cyclobenzaprine (FLEXERIL) 5 MG tablet 1 qhs prn (Patient taking differently: Take 5 mg by mouth at bedtime as needed for muscle spasms.)  ? fluticasone (FLONASE) 50 MCG/ACT nasal spray Place 2 sprays into both nostrils daily.  ? furosemide (LASIX) 20 MG tablet Take 1 tablet (20 mg total) by mouth daily.  ? HYDROcodone-acetaminophen (NORCO/VICODIN) 5-325 MG tablet 1 q6 prn caution drowsiness for infrequent use  ? lidocaine (LIDODERM) 5 % Place 1 patch onto the skin daily. Remove & Discard patch within 12 hours or as directed by MD  ? losartan (COZAAR) 25 MG tablet Take 1 tablet (25 mg total) by mouth daily.  ? nitroGLYCERIN (NITROSTAT) 0.4 MG SL tablet PLACE 1 TAB UNDER TONGUE EVERY 5 MIN IF NEEDED FOR CHEST PAIN. MAY USE 3 TIMES.NO RELIEF CALL 911. (Patient taking differently: Place 0.4 mg under the tongue every 5 (five) minutes as needed for chest pain.)  ? omeprazole (PRILOSEC) 20 MG capsule 1 qd  ? oxymetazoline (AFRIN) 0.05 % nasal spray Place 1 spray into both nostrils 2 (two) times daily as needed for congestion.  ? rOPINIRole (REQUIP) 1 MG tablet TAKE 1 TABLET BY MOUTH AT BEDTIME  ? tiZANidine (ZANAFLEX) 2 MG tablet Take 1 tablet (2 mg total) by mouth every 8 (eight) hours as needed for muscle spasms.  ? topiramate (TOPAMAX) 50 MG tablet Take 1 tablet (50 mg total) by mouth 2 (two) times daily.  ? warfarin (COUMADIN) 5 MG tablet  TAKE 1 TO 2 TABLETS BY MOUTH ONCE DAILY BASED ON INR - DOSE CHANGE  ? ?No facility-administered encounter medications on file as of 03/09/2022.  ? ? ?Patient Active Problem List  ? Diagnosis Date Noted  ? Acute CVA (cerebrovascular accident) ([place]) 04/16/2020  ? History of colonic polyps 04/05/2018  ? Vitamin D deficiency 01/12/2017  ? Osteopenia 11/03/2016  ? Cervical pain 11/25/2015  ?  Cervical spondylosis without myelopathy 11/25/2015  ? Hemorrhoids 06/02/2015  ? Loose stools 06/02/2015  ? Chest pain 05/06/2015  ? H/O cardiomyopathy 05/06/2015  ? Essential hypertension 05/06/2015  ? Chronic systolic congestive heart failure ([place]) 05/06/2015  ? Barrett's esophagus   ? Atherosclerotic heart disease of native coronary artery with other forms of angina pectoris ([place]) 12/22/2014  ? Rectal bleeding 12/18/2014  ? 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 Elevated PSA 11/20/2014  ? Hyperlipidemia 11/20/2014  ? Elevated fasting glucose 11/20/2014  ? Atrial fibrillation ([place]) 08/12/2010  ? GASTROESOPHAGEAL REFLUX DISEASE 08/12/2010  ? ? ?Conditions to be addressed/monitored: HTN and HLD ? ?Care Plan : RN Care Manager Plan of Care  ?Updates made by Kassie Mends, RN since 03/09/2022 12:00 AM  ?  ? ?Problem: No plan of care established for management of chronic disease state  (HTN, HLD)   ?Priority: High  ?  ? ?Long-Range Goal: Development of plan of care for chronic disease management  (HTN, HLD)   ?Start Date: 03/09/2022  ?Expected End Date: 09/05/2022  ?Priority: High  ?Note:   ?Current Barriers:  ?Knowledge Deficits related to plan of care for management of HTN and HLD  ?Patient reports lives with spouse, is independent in all aspects of his care, continues to drive, checks blood pressure on occasion, tries to eat healthy, gets outdoors and walks some for exercise. Reports nitroglycerin prescription is expired. ? ?RNCM Clinical Goal(s):  ?Patient will verbalize understanding of plan for management of HTN and HLD as evidenced by patient report, review of EHR and  through collaboration with RN Care manager, provider, and care team.  ? ?Interventions: ?1:1 collaboration with primary care provider regarding development and update of comprehensive plan of care as evidenced by provider attestation and co-signature ?Inter-disciplinary care team collaboration (see longitudinal plan of care) ?Evaluation of  current treatment plan related to  self management and patient's adherence to plan as established by provider ? ? ?Hyperlipidemia:  (Status: New goal. Goal on Track (progressing): YES.) Long Term Goal  ?Lab Results  ?Component Value Date  ? CHOL 105 08/17/2021  ? HDL 30 (L) 08/17/2021  ? [place] 54 08/17/2021  ? TRIG 111 08/17/2021  ? CHOLHDL 3.5 08/17/2021  ?  ? ?Medication review performed; medication list updated in electronic medical record.  ?Provider established cholesterol goals reviewed; ?Counseled on importance of regular laboratory monitoring as prescribed; ?Provided HLD educational materials; ?Reviewed role and benefits of statin for ASCVD risk reduction; ?Reviewed importance of limiting foods high in cholesterol; ?Screening for signs and symptoms of depression related to chronic disease state;  ?Assessed social determinant of health barriers;  ?Pain assessment completed ?Education via My chart- heart healthy diet ? ? ?Hypertension Interventions:  (Status:  New goal. and Goal on track:  Yes.) Long Term Goal ?Last practice recorded BP readings:  ?BP Readings from Last 3 Encounters:  ?10/25/21 (!) 157/82  ?09/16/21 138/84  ?07/15/21 (!) 154/102  ?Most recent eGFR/CrCl:  ?Lab Results  ?Component Value Date  ? EGFR 75 08/17/2021  ?  No components found for: CRCL ? ?Evaluation  of current treatment plan related to hypertension self management and patient's adherence to plan as established by provider ?Reviewed medications with patient and discussed importance of compliance ?Counseled on the importance of exercise goals with target of 150 minutes per week ?Discussed plans with patient for ongoing care management follow up and provided patient with direct contact information for care management team ?Discussed complications of poorly controlled blood pressure such as heart disease, stroke, circulatory complications, vision complications, kidney impairment, sexual dysfunction  ?Education via My chart- low sodium  diet ?In basket sent to primary care provider requesting new prescription for nitroglycerin (current script is expired) ? ?Patient Goals/Self-Care Activities: ?Take medications as prescribed   ?Attend all scheduled

## 2022-03-10 ENCOUNTER — Other Ambulatory Visit: Payer: Self-pay | Admitting: *Deleted

## 2022-03-10 MED ORDER — NITROGLYCERIN 0.4 MG SL SUBL: SUBLINGUAL_TABLET | SUBLINGUAL | 1 refills | Status: DC

## 2022-03-10 NOTE — Telephone Encounter (Signed)
Prescription sent

## 2022-03-24 ENCOUNTER — Ambulatory Visit (INDEPENDENT_AMBULATORY_CARE_PROVIDER_SITE_OTHER): Payer: Medicare Other | Admitting: Family Medicine

## 2022-03-24 ENCOUNTER — Other Ambulatory Visit: Payer: Self-pay | Admitting: *Deleted

## 2022-03-24 VITALS — BP 136/86 | HR 71 | Temp 97.9°F | Ht 73.0 in | Wt 216.8 lb

## 2022-03-24 DIAGNOSIS — I10 Essential (primary) hypertension: Secondary | ICD-10-CM

## 2022-03-24 DIAGNOSIS — N432 Other hydrocele: Secondary | ICD-10-CM | POA: Diagnosis not present

## 2022-03-24 DIAGNOSIS — N451 Epididymitis: Secondary | ICD-10-CM

## 2022-03-24 DIAGNOSIS — R972 Elevated prostate specific antigen [PSA]: Secondary | ICD-10-CM | POA: Diagnosis not present

## 2022-03-24 DIAGNOSIS — E785 Hyperlipidemia, unspecified: Secondary | ICD-10-CM

## 2022-03-24 MED ORDER — ROPINIROLE HCL 2 MG PO TABS: 2.0000 mg | ORAL_TABLET | Freq: Every day | ORAL | 1 refills | Status: DC

## 2022-03-24 MED ORDER — SULFAMETHOXAZOLE-TRIMETHOPRIM 800-160 MG PO TABS: 1.0000 | ORAL_TABLET | Freq: Two times a day (BID) | ORAL | 0 refills | Status: DC

## 2022-03-24 NOTE — Progress Notes (Signed)
? ?  Subjective:  ? ? Patient ID: David Dougherty, male    DOB: [birth year], 71 y.o.   MRN: [id number] ? ?HPI ? ?Patient here for swollen bottom, has been going on for 4 days.   ?Upon discussion it is actually his left testicle that swollen present for the past 4 days some tenderness no other particular troubles ?Review of Systems ? ?   ?Objective:  ? Physical Exam ?General-in no acute distress ?Eyes-no discharge ?Lungs-respiratory rate normal, CTA ?CV-no murmurs atrial fibrillation noted ?Extremities skin warm dry no edema ?Neuro grossly normal ?Behavior normal, alert ? ?Patient has firm area on the backside of the testicle consistent with epididymitis it is tender he also has a hydrocele associated with this.  No cellulitis. ? ? ?   ?Assessment & Plan:  ?1. Epididymitis ?Bactrim twice daily over the next 10 days call us if any problems follow-up within 3 to 4 weeks cool compresses as needed with a thin cloth around crushed ice 20 minutes at a time 3-4 times a day as needed ? ?2. PSA elevation ?Repeat PSA due this blood work before follow-up visit ? ?3. Other hydrocele ?See per above ? ?No ultrasound or urology consult currently unless it worsens or does not go away ? ? ?

## 2022-04-01 ENCOUNTER — Other Ambulatory Visit (INDEPENDENT_AMBULATORY_CARE_PROVIDER_SITE_OTHER): Payer: Medicare Other | Admitting: *Deleted

## 2022-04-01 DIAGNOSIS — Z7901 Long term (current) use of anticoagulants: Secondary | ICD-10-CM

## 2022-04-01 LAB — POCT INR: INR: 3.5 — AB (ref 2.0–3.0)

## 2022-04-03 DIAGNOSIS — I10 Essential (primary) hypertension: Secondary | ICD-10-CM

## 2022-04-03 DIAGNOSIS — E7849 Other hyperlipidemia: Secondary | ICD-10-CM

## 2022-04-07 LAB — BASIC METABOLIC PANEL
BUN/Creatinine Ratio: 14 (ref 10–24)
BUN: 16 mg/dL (ref 8–27)
CO2: 18 mmol/L — ABNORMAL LOW (ref 20–29)
Calcium: 9.1 mg/dL (ref 8.6–10.2)
Chloride: 106 mmol/L (ref 96–106)
Creatinine, Ser: 1.18 mg/dL (ref 0.76–1.27)
Glucose: 106 mg/dL — ABNORMAL HIGH (ref 70–99)
Potassium: 4.1 mmol/L (ref 3.5–5.2)
Sodium: 140 mmol/L (ref 134–144)
eGFR: 66 mL/min/{1.73_m2} (ref >59)

## 2022-04-07 LAB — LIPID PANEL
Chol/HDL Ratio: 2.7 ratio (ref 0.0–5.0)
Cholesterol, Total: 95 mg/dL — ABNORMAL LOW (ref 100–199)
HDL: 35 mg/dL — ABNORMAL LOW (ref >39)
LDL Chol Calc (NIH): 43 mg/dL (ref 0–99)
Triglycerides: 84 mg/dL (ref 0–149)
VLDL Cholesterol Cal: 17 mg/dL (ref 5–40)

## 2022-04-07 LAB — HEPATIC FUNCTION PANEL
ALT: 19 IU/L (ref 0–44)
AST: 18 IU/L (ref 0–40)
Albumin: 4.6 g/dL (ref 3.7–4.7)
Alkaline Phosphatase: 89 IU/L (ref 44–121)
Bilirubin Total: 0.5 mg/dL (ref 0.0–1.2)
Bilirubin, Direct: 0.16 mg/dL (ref 0.00–0.40)
Total Protein: 7.4 g/dL (ref 6.0–8.5)

## 2022-04-07 LAB — PSA: Prostate Specific Ag, Serum: 4.5 ng/mL — ABNORMAL HIGH (ref 0.0–4.0)

## 2022-04-15 ENCOUNTER — Other Ambulatory Visit (INDEPENDENT_AMBULATORY_CARE_PROVIDER_SITE_OTHER): Payer: Medicare Other | Admitting: *Deleted

## 2022-04-15 DIAGNOSIS — Z7901 Long term (current) use of anticoagulants: Secondary | ICD-10-CM

## 2022-04-15 LAB — POCT INR: INR: 3 (ref 2.0–3.0)

## 2022-04-18 ENCOUNTER — Encounter: Payer: Self-pay | Admitting: Family Medicine

## 2022-04-18 ENCOUNTER — Ambulatory Visit (INDEPENDENT_AMBULATORY_CARE_PROVIDER_SITE_OTHER): Payer: Medicare Other | Admitting: Family Medicine

## 2022-04-18 VITALS — BP 127/82 | HR 80 | Temp 98.1°F | Wt 211.6 lb

## 2022-04-18 DIAGNOSIS — M7918 Myalgia, other site: Secondary | ICD-10-CM | POA: Diagnosis not present

## 2022-04-18 DIAGNOSIS — E785 Hyperlipidemia, unspecified: Secondary | ICD-10-CM | POA: Diagnosis not present

## 2022-04-18 DIAGNOSIS — I10 Essential (primary) hypertension: Secondary | ICD-10-CM

## 2022-04-18 MED ORDER — HYDROCODONE-ACETAMINOPHEN 5-325 MG PO TABS: ORAL_TABLET | ORAL | 0 refills | Status: DC

## 2022-04-18 NOTE — Progress Notes (Signed)
? ?  Subjective:  ? ? Patient ID: David Dougherty, male    DOB: 14-Dec-1950, 71 y.o.   MRN: [id number] ? ?HPI ?Pt here for follow up from 03/24/22. Pt was seen for Epididymitis.  ? ?Pt states if he sits to long (10 min) hurts when he gets up. No swelling. Pt having pain in bottom area-pt states has been going on for a while.  ? ?Results for orders placed or performed in visit on 04/15/22  ?POCT INR  ?Result Value Ref Range  ? INR 3.0 2.0 - 3.0  ? ? ?Review of Systems ? ?   ?Objective:  ? Physical Exam ? ?General-in no acute distress ?Eyes-no discharge ?Lungs-respiratory rate normal, CTA ?CV-no murmurs, a fib ?Extremities skin warm dry no edema ?Neuro grossly normal ?Behavior normal, alert ?Negative straight leg raise ?Good internal and external rotation of the hip ?Low back nontender ?Buttock nontender ? ? ?   ?Assessment & Plan:  ?1. Buttock pain ?It is hard to know what is causing this.  We will go forward with doing x-rays first ?I do not feel he needs to have a scan ?Very unlikely to be prostate cancer ?Previous MRI of his prostate look good ?PSA relatively lower compared to where it was ?May need further work-up with specialist ?- DG Lumbar Spine Complete ?- DG Sacrum/Coccyx ?- DG Hip Unilat W OR W/O Pelvis 2-3 Views Right ?- DG Hip Unilat W OR W/O Pelvis 2-3 Views Left ? ?2. Hyperlipidemia, unspecified hyperlipidemia type ?Continue current measures recent labs reviewed ? ?3. Essential hypertension ?Blood pressure doing overall well continue current measures ? ? ?

## 2022-04-20 ENCOUNTER — Other Ambulatory Visit: Payer: Self-pay | Admitting: *Deleted

## 2022-04-20 DIAGNOSIS — R972 Elevated prostate specific antigen [PSA]: Secondary | ICD-10-CM

## 2022-04-20 DIAGNOSIS — Z79899 Other long term (current) drug therapy: Secondary | ICD-10-CM

## 2022-04-20 DIAGNOSIS — Z9229 Personal history of other drug therapy: Secondary | ICD-10-CM

## 2022-05-09 ENCOUNTER — Ambulatory Visit (HOSPITAL_COMMUNITY)
Admission: RE | Admit: 2022-05-09 | Discharge: 2022-05-09 | Disposition: A | Discharge status: Home / Self Care | Payer: Medicare Other | Source: Ambulatory Visit | Attending: Family Medicine | Admitting: Family Medicine

## 2022-05-09 DIAGNOSIS — M7918 Myalgia, other site: Secondary | ICD-10-CM | POA: Insufficient documentation

## 2022-05-13 ENCOUNTER — Other Ambulatory Visit (INDEPENDENT_AMBULATORY_CARE_PROVIDER_SITE_OTHER): Payer: Medicare Other | Admitting: Family Medicine

## 2022-05-13 DIAGNOSIS — Z9229 Personal history of other drug therapy: Secondary | ICD-10-CM

## 2022-05-13 LAB — POCT INR: INR: 3.4 — AB (ref 2.0–3.0)

## 2022-05-13 NOTE — Patient Instructions (Signed)
Hold dose tonight, may resume tomorrow 05/14/22. Recheck INR In 2 weeks.

## 2022-05-16 ENCOUNTER — Other Ambulatory Visit: Payer: Self-pay | Admitting: Family Medicine

## 2022-05-16 ENCOUNTER — Telehealth: Payer: Self-pay | Admitting: *Deleted

## 2022-05-16 MED ORDER — HYDROCODONE-ACETAMINOPHEN 5-325 MG PO TABS: 1.0000 | ORAL_TABLET | Freq: Four times a day (QID) | ORAL | 0 refills | Status: DC | PRN

## 2022-05-16 NOTE — Telephone Encounter (Signed)
Cook, Jayce G, DO   ? ?Rx sent.   ? ?

## 2022-05-16 NOTE — Telephone Encounter (Signed)
Patient left message on machine requesting refill of Hydrocodone for his back pain. Patient last received #[phone] from Dr Nicki Reaper in May  [facility] [place]

## 2022-05-17 NOTE — Telephone Encounter (Signed)
Patient notified

## 2022-05-27 ENCOUNTER — Telehealth: Payer: Self-pay | Admitting: *Deleted

## 2022-05-27 ENCOUNTER — Other Ambulatory Visit (INDEPENDENT_AMBULATORY_CARE_PROVIDER_SITE_OTHER): Payer: Medicare Other | Admitting: *Deleted

## 2022-05-27 DIAGNOSIS — Z7901 Long term (current) use of anticoagulants: Secondary | ICD-10-CM

## 2022-05-27 LAB — POCT INR: INR: 2.9 (ref 2.0–3.0)

## 2022-05-30 ENCOUNTER — Other Ambulatory Visit: Payer: Self-pay | Admitting: Family Medicine

## 2022-05-30 MED ORDER — HYDROCODONE-ACETAMINOPHEN 5-325 MG PO TABS: 1.0000 | ORAL_TABLET | Freq: Four times a day (QID) | ORAL | 0 refills | Status: DC | PRN

## 2022-05-30 NOTE — Telephone Encounter (Signed)
Patient informed of md message. Verbalized understanding. ?

## 2022-06-01 ENCOUNTER — Ambulatory Visit (INDEPENDENT_AMBULATORY_CARE_PROVIDER_SITE_OTHER): Payer: Medicare Other | Admitting: *Deleted

## 2022-06-01 DIAGNOSIS — I10 Essential (primary) hypertension: Secondary | ICD-10-CM

## 2022-06-01 DIAGNOSIS — E785 Hyperlipidemia, unspecified: Secondary | ICD-10-CM

## 2022-06-01 NOTE — Patient Instructions (Signed)
Visit Information  Thank you for taking time to visit with me today. Please don't hesitate to contact me if I can be of assistance to you before our next scheduled telephone appointment.  Following are the goals we discussed today:  Take medications as prescribed   Attend all scheduled provider appointments Call pharmacy for medication refills 3-7 days in advance of running out of medications Attend church or other social activities Perform all self care activities independently  Perform IADL's (shopping, preparing meals, housekeeping, managing finances) independently Call provider office for new concerns or questions  check blood pressure weekly choose a place to take my blood pressure (home, clinic or office, retail store) write blood pressure results in a log or diary keep a blood pressure log take blood pressure log to all doctor appointments keep all doctor appointments take medications for blood pressure exactly as prescribed eat more whole grains, fruits and vegetables, lean meats and healthy fats take all medications exactly as prescribed call doctor with any symptoms you believe are related to your medicine call doctor when you experience any new symptoms go to all doctor appointments as scheduled adhere to prescribed diet: heart healthy and low sodium Continue getting outdoors, working in your shop and walking Read labels for sodium content Follow heart healthy diet- grill or bake foods instead of frying Goals met, case closure today  Our next appointment is - no further follow up required, case closed  Please call the care guide team at [phone] if you need to cancel or reschedule your appointment.   If you are experiencing a Mental Health or [place] or need someone to talk to, please call the Suicide and Crisis Lifeline: 988 call the [place] National Suicide Prevention Lifeline: 514 160 5541 or TTY: 951-282-4703 TTY 503-167-2232) to talk to a  trained counselor call 1-800-273-TALK (toll free, 24 hour hotline) go to [facility] [address] [place] [phone]) call 911   Patient verbalizes understanding of instructions and care plan provided today and agrees to view in [place]. Active MyChart status and patient understanding of how to access instructions and care plan via MyChart confirmed with patient.     No further follow up required: case closed  Jacqlyn Larsen [facility], BSN RN Case Manager [place] [phone]

## 2022-06-01 NOTE — Chronic Care Management (AMB) (Signed)
Chronic Care Management   CCM RN Visit Note  06/01/2022 Name: David Dougherty MRN: [id number] DOB: 01/06/1951  Subjective: David Dougherty is a 71 y.o. year old male who is a primary care patient of Luking, Elayne Snare, MD. The care management team was consulted for assistance with disease management and care coordination needs.    Engaged with patient by telephone for follow up visit in response to provider referral for case management and/or care coordination services.   Consent to Services:  The patient was given information about Chronic Care Management services, agreed to services, and gave verbal consent prior to initiation of services.  Please see initial visit note for detailed documentation.   Patient agreed to services and verbal consent obtained.   Assessment: Review of patient past medical history, allergies, medications, health status, including review of consultants reports, laboratory and other test data, was performed as part of comprehensive evaluation and provision of chronic care management services.   SDOH (Social Determinants of Health) assessments and interventions performed:    CCM Care Plan  Allergies  Allergen Reactions   Reglan [Metoclopramide] Anaphylaxis    Outpatient Encounter Medications as of 06/01/2022  Medication Sig   atorvastatin (LIPITOR) 40 MG tablet Take 1 tablet (40 mg total) by mouth daily.   calcium carbonate (TUMS - DOSED IN MG ELEMENTAL CALCIUM) 500 MG chewable tablet Chew 2-3 tablets by mouth as needed for indigestion or heartburn.    carvedilol (COREG) 3.125 MG tablet Take 1 tablet (3.125 mg total) by mouth 2 (two) times daily.   Cholecalciferol (VITAMIN D-3) 125 MCG (5000 UT) TABS Take 5,000 Units by mouth daily.   cyclobenzaprine (FLEXERIL) 5 MG tablet 1 qhs prn (Patient taking differently: Take 5 mg by mouth at bedtime as needed for muscle spasms.)   fluticasone (FLONASE) 50 MCG/ACT nasal spray Place 2 sprays into both nostrils  daily.   furosemide (LASIX) 20 MG tablet Take 1 tablet (20 mg total) by mouth daily.   HYDROcodone-acetaminophen (NORCO/VICODIN) 5-325 MG tablet Take 1 tablet by mouth every 6 (six) hours as needed for moderate pain.   lidocaine (LIDODERM) 5 % Place 1 patch onto the skin daily. Remove & Discard patch within 12 hours or as directed by MD   losartan (COZAAR) 25 MG tablet Take 1 tablet (25 mg total) by mouth daily.   nitroGLYCERIN (NITROSTAT) 0.4 MG SL tablet PLACE 1 TAB UNDER TONGUE EVERY 5 MIN IF NEEDED FOR CHEST PAIN. MAY USE 3 TIMES.NO RELIEF CALL 911. Strength: 0.4 mg   omeprazole (PRILOSEC) 20 MG capsule 1 qd   oxymetazoline (AFRIN) 0.05 % nasal spray Place 1 spray into both nostrils 2 (two) times daily as needed for congestion.   rOPINIRole (REQUIP) 2 MG tablet Take 1 tablet (2 mg total) by mouth at bedtime.   tiZANidine (ZANAFLEX) 2 MG tablet Take 1 tablet (2 mg total) by mouth every 8 (eight) hours as needed for muscle spasms.   topiramate (TOPAMAX) 50 MG tablet Take 1 tablet (50 mg total) by mouth 2 (two) times daily.   warfarin (COUMADIN) 5 MG tablet TAKE 1 TO 2 TABLETS BY MOUTH ONCE DAILY BASED ON INR - DOSE CHANGE   sulfamethoxazole-trimethoprim (BACTRIM DS) 800-160 MG tablet Take 1 tablet by mouth 2 (two) times daily. (Patient not taking: Reported on 06/01/2022)   No facility-administered encounter medications on file as of 06/01/2022.    Patient Active Problem List   Diagnosis Date Noted   Acute CVA (cerebrovascular accident) ([place]) 04/16/2020  History of colonic polyps 04/05/2018   Vitamin D deficiency 01/12/2017   Osteopenia 11/03/2016   Cervical pain 11/25/2015   Cervical spondylosis without myelopathy 11/25/2015   Hemorrhoids 06/02/2015   Loose stools 06/02/2015   Chest pain 05/06/2015   H/O cardiomyopathy 05/06/2015   Essential hypertension 80/99/8338   Chronic systolic congestive heart failure ([place]) 05/06/2015   Barrett's esophagus    Atherosclerotic heart disease of  native coronary artery with other forms of angina pectoris ([place]) 12/22/2014   Rectal bleeding 12/18/2014   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   Elevated PSA 11/20/2014   Hyperlipidemia 11/20/2014   Elevated fasting glucose 11/20/2014   Atrial fibrillation ([place]) 08/12/2010   GASTROESOPHAGEAL REFLUX DISEASE 08/12/2010    Conditions to be addressed/monitored:HTN and HLD  Care Plan : RN Care Manager Plan of Care  Updates made by Kassie Mends, RN since 06/01/2022 12:00 AM  Completed 06/01/2022   Problem: No plan of care established for management of chronic disease state  (HTN, HLD) Resolved 06/01/2022  Priority: High     Long-Range Goal: Development of plan of care for chronic disease management  (HTN, HLD) Completed 06/01/2022  Start Date: 03/09/2022  Expected End Date: 09/05/2022  Priority: High  Note:   Current Barriers:  Knowledge Deficits related to plan of care for management of HTN and HLD  Patient reports lives with spouse, is independent in all aspects of his care, continues to drive, checks blood pressure on occasion, tries to eat healthy, gets outdoors and walks some for exercise. Reports nitroglycerin prescription was expired and now has new prescription, reports checking blood pressure on occasion and readings have "been good"  Patient reports he recently had pain in buttocks but has resolved, denies any pain today, no new concerns or issues reported.  RNCM Clinical Goal(s):  Patient will verbalize understanding of plan for management of HTN and HLD as evidenced by patient report, review of EHR and  through collaboration with RN Care manager, provider, and care team.   Interventions: 1:1 collaboration with primary care provider regarding development and update of comprehensive plan of care as evidenced by provider attestation and co-signature Inter-disciplinary care team collaboration (see longitudinal plan of care) Evaluation of current treatment plan related to  self  management and patient's adherence to plan as established by provider   Hyperlipidemia:  (Status: New goal. Goal on Track (progressing): YES. Goal Met.) Long Term Goal  Lab Results  Component Value Date   CHOL 105 08/17/2021   HDL 30 (L) 08/17/2021   LDLCALC 54 08/17/2021   TRIG 111 08/17/2021   CHOLHDL 3.5 08/17/2021     Medication review performed; medication list updated in electronic medical record.  Provider established cholesterol goals reviewed; Counseled on importance of regular laboratory monitoring as prescribed; Reviewed role and benefits of statin for ASCVD risk reduction; Reviewed importance of limiting foods high in cholesterol; Pain assessment completed Reinforced heart healthy diet Reviewed plan of care, goals met and case closure today,  pt verbalizes understanding.   Hypertension Interventions:  (Status:  New goal., Goal on track:  Yes., and Goal Met.) Long Term Goal Last practice recorded BP readings:  BP Readings from Last 3 Encounters:  10/25/21 (!) 157/82  09/16/21 138/84  07/15/21 (!) 154/102  Most recent eGFR/CrCl:  Lab Results  Component Value Date   EGFR 75 08/17/2021    No components found for: CRCL  Evaluation of current treatment plan related to hypertension self management and patient's adherence to plan as established by  provider Reviewed medications with patient and discussed importance of compliance Counseled on the importance of exercise goals with target of 150 minutes per week Discussed plans with patient for ongoing care management follow up and provided patient with direct contact information for care management team Discussed complications of poorly controlled blood pressure such as heart disease, stroke, circulatory complications, vision complications, kidney impairment, sexual dysfunction Confirmed patient does have nitroglycerin on hand    Patient Goals/Self-Care Activities: Take medications as prescribed   Attend all scheduled  provider appointments Call pharmacy for medication refills 3-7 days in advance of running out of medications Attend church or other social activities Perform all self care activities independently  Perform IADL's (shopping, preparing meals, housekeeping, managing finances) independently Call provider office for new concerns or questions  check blood pressure weekly choose a place to take my blood pressure (home, clinic or office, retail store) write blood pressure results in a log or diary keep a blood pressure log take blood pressure log to all doctor appointments keep all doctor appointments take medications for blood pressure exactly as prescribed eat more whole grains, fruits and vegetables, lean meats and healthy fats - take all medications exactly as prescribed - call doctor with any symptoms you believe are related to your medicine - call doctor when you experience any new symptoms - go to all doctor appointments as scheduled - adhere to prescribed diet: heart healthy and low sodium Continue getting outdoors, working in your shop and walking Read labels for sodium content Follow heart healthy diet- grill or bake foods instead of frying Goals met, case closure today       Plan:No further follow up required: goals met, case closed  Jacqlyn Larsen [facility], BSN RN Case Manager [place] [phone]

## 2022-06-03 ENCOUNTER — Other Ambulatory Visit: Payer: Self-pay | Admitting: Family Medicine

## 2022-06-03 DIAGNOSIS — E785 Hyperlipidemia, unspecified: Secondary | ICD-10-CM | POA: Diagnosis not present

## 2022-06-03 DIAGNOSIS — I10 Essential (primary) hypertension: Secondary | ICD-10-CM

## 2022-06-06 ENCOUNTER — Other Ambulatory Visit: Payer: Self-pay | Admitting: Family Medicine

## 2022-06-13 ENCOUNTER — Other Ambulatory Visit: Payer: Self-pay | Admitting: Family Medicine

## 2022-06-15 LAB — CBC WITH DIFFERENTIAL/PLATELET
Basophils Absolute: 0 10*3/uL (ref 0.0–0.2)
Basos: 0 %
EOS (ABSOLUTE): 0.1 10*3/uL (ref 0.0–0.4)
Eos: 2 %
Hematocrit: 41.2 % (ref 37.5–51.0)
Hemoglobin: 13.7 g/dL (ref 13.0–17.7)
Immature Grans (Abs): 0 10*3/uL (ref 0.0–0.1)
Immature Granulocytes: 0 %
Lymphocytes Absolute: 1.3 10*3/uL (ref 0.7–3.1)
Lymphs: 27 %
MCH: 28.8 pg (ref 26.6–33.0)
MCHC: 33.3 g/dL (ref 31.5–35.7)
MCV: 87 fL (ref 79–97)
Monocytes Absolute: 0.3 10*3/uL (ref 0.1–0.9)
Monocytes: 7 %
Neutrophils Absolute: 3.1 10*3/uL (ref 1.4–7.0)
Neutrophils: 64 %
Platelets: 187 10*3/uL (ref 150–450)
RBC: 4.76 x10E6/uL (ref 4.14–5.80)
RDW: 13 % (ref 11.6–15.4)
WBC: 4.8 10*3/uL (ref 3.4–10.8)

## 2022-06-15 LAB — BASIC METABOLIC PANEL
BUN/Creatinine Ratio: 17 (ref 10–24)
BUN: 17 mg/dL (ref 8–27)
CO2: 18 mmol/L — ABNORMAL LOW (ref 20–29)
Calcium: 9 mg/dL (ref 8.6–10.2)
Chloride: 109 mmol/L — ABNORMAL HIGH (ref 96–106)
Creatinine, Ser: 0.99 mg/dL (ref 0.76–1.27)
Glucose: 106 mg/dL — ABNORMAL HIGH (ref 70–99)
Potassium: 3.9 mmol/L (ref 3.5–5.2)
Sodium: 141 mmol/L (ref 134–144)
eGFR: 81 mL/min/{1.73_m2} (ref >59)

## 2022-06-15 LAB — PSA: Prostate Specific Ag, Serum: 3.9 ng/mL (ref 0.0–4.0)

## 2022-06-22 ENCOUNTER — Other Ambulatory Visit: Payer: Self-pay | Admitting: Family Medicine

## 2022-06-24 ENCOUNTER — Ambulatory Visit (INDEPENDENT_AMBULATORY_CARE_PROVIDER_SITE_OTHER): Payer: Medicare Other

## 2022-06-24 DIAGNOSIS — Z79899 Other long term (current) drug therapy: Secondary | ICD-10-CM

## 2022-06-24 LAB — POCT INR: INR: 2.8 (ref 2.0–3.0)

## 2022-07-06 ENCOUNTER — Other Ambulatory Visit: Payer: Self-pay | Admitting: Family Medicine

## 2022-07-06 ENCOUNTER — Telehealth: Payer: Self-pay | Admitting: Family Medicine

## 2022-07-06 MED ORDER — HYDROCODONE-ACETAMINOPHEN 5-325 MG PO TABS: 1.0000 | ORAL_TABLET | Freq: Four times a day (QID) | ORAL | 0 refills | Status: DC | PRN

## 2022-07-06 NOTE — Telephone Encounter (Signed)
Pt requesting refill on Hydrocodone 5-325 mg. Pt states he is still having hip/back pain. Please advise. Thank you [facility].

## 2022-07-06 NOTE — Telephone Encounter (Signed)
Patient notified and scheduled follow up office visit with Dr Nicki Reaper 07/19/22

## 2022-07-06 NOTE — Telephone Encounter (Signed)
Nurses let the patient know that I did send in a refill on his medicine if he needs further refills he will need to do an office visit because of this is a controlled medicine

## 2022-07-08 ENCOUNTER — Ambulatory Visit: Payer: Medicare Other | Admitting: Family Medicine

## 2022-07-18 ENCOUNTER — Other Ambulatory Visit: Payer: Self-pay | Admitting: Family Medicine

## 2022-07-19 ENCOUNTER — Ambulatory Visit (INDEPENDENT_AMBULATORY_CARE_PROVIDER_SITE_OTHER): Payer: Medicare Other | Admitting: Family Medicine

## 2022-07-19 ENCOUNTER — Encounter: Payer: Self-pay | Admitting: Family Medicine

## 2022-07-19 VITALS — BP 118/76 | HR 73 | Wt 207.0 lb

## 2022-07-19 DIAGNOSIS — M5432 Sciatica, left side: Secondary | ICD-10-CM

## 2022-07-19 DIAGNOSIS — R972 Elevated prostate specific antigen [PSA]: Secondary | ICD-10-CM | POA: Diagnosis not present

## 2022-07-19 DIAGNOSIS — I10 Essential (primary) hypertension: Secondary | ICD-10-CM

## 2022-07-19 DIAGNOSIS — I480 Paroxysmal atrial fibrillation: Secondary | ICD-10-CM | POA: Diagnosis not present

## 2022-07-19 DIAGNOSIS — Z7901 Long term (current) use of anticoagulants: Secondary | ICD-10-CM | POA: Diagnosis not present

## 2022-07-19 LAB — POCT INR: INR: 2.9 (ref 2.0–3.0)

## 2022-07-19 MED ORDER — FUROSEMIDE 20 MG PO TABS: 20.0000 mg | ORAL_TABLET | Freq: Every day | ORAL | 1 refills | Status: DC

## 2022-07-19 MED ORDER — OMEPRAZOLE 20 MG PO CPDR: DELAYED_RELEASE_CAPSULE | ORAL | 1 refills | Status: DC

## 2022-07-19 MED ORDER — TOPIRAMATE 50 MG PO TABS
50.0000 mg | ORAL_TABLET | Freq: Two times a day (BID) | ORAL | 1 refills | Status: DC
Start: 2022-07-19 — End: 2023-02-02

## 2022-07-19 MED ORDER — WARFARIN SODIUM 5 MG PO TABS: ORAL_TABLET | ORAL | 1 refills | Status: DC

## 2022-07-19 MED ORDER — LOSARTAN POTASSIUM 25 MG PO TABS
25.0000 mg | ORAL_TABLET | Freq: Every day | ORAL | 1 refills | Status: DC
Start: 2022-07-19 — End: 2023-03-01

## 2022-07-19 MED ORDER — ATORVASTATIN CALCIUM 40 MG PO TABS: 40.0000 mg | ORAL_TABLET | Freq: Every day | ORAL | 1 refills | Status: DC

## 2022-07-19 MED ORDER — CARVEDILOL 3.125 MG PO TABS: 3.1250 mg | ORAL_TABLET | Freq: Two times a day (BID) | ORAL | 1 refills | Status: DC

## 2022-07-19 MED ORDER — ROPINIROLE HCL 3 MG PO TABS: 3.0000 mg | ORAL_TABLET | Freq: Every day | ORAL | 5 refills | Status: DC

## 2022-07-19 NOTE — Patient Instructions (Signed)
We will need to recheck your INR next week to make sure that the adjustment in medication is not affecting your INR  Regular follow-up office visit in 4 months  TakeCare-Dr. Nicki Reaper

## 2022-07-19 NOTE — Progress Notes (Signed)
   Subjective:    Patient ID: Raistlin Chip Boer, male    DOB: 09/17/1951, 71 y.o.   MRN: [id number]  HPI Pt arrives for follow up on labs. Pt had labs completed 06/14/22. Pt had last INR on 06/15/22. Pt still having back pain. Nerves in left leg kept him up all night; did not get much sleep due to leg pain.    Review of Systems     Objective:   Physical Exam  General-in no acute distress Eyes-no discharge Lungs-respiratory rate normal, CTA CV-no murmur Extremities skin warm dry no edema Neuro grossly normal Behavior normal, alert       Assessment & Plan:  1. Encounter for current long-term use of anticoagulants INR checked follow on a regular basis - INR  2. Paroxysmal atrial fibrillation (HCC) It is wise for him to stay on the Coumadin to lessen the risk of strokes  3. Essential hypertension Blood pressure under good control continue current measures  4. Elevated PSA He has a history of elevated PSA but is now normal.  He has seen urology in the past.  We will follow PSA closely every 6 months normal currently  He had an MRI back in December it did show an area but I do not see evidence regarding urology recommendations we will do a record request  5. Sciatica, left side Intermittent burning pain discomfort more than likely related to nerve in the back  Patient has follow-up in 4 months

## 2022-07-22 ENCOUNTER — Ambulatory Visit: Payer: Medicare Other

## 2022-07-28 ENCOUNTER — Ambulatory Visit: Payer: Medicare Other

## 2022-07-29 ENCOUNTER — Ambulatory Visit (INDEPENDENT_AMBULATORY_CARE_PROVIDER_SITE_OTHER): Payer: Medicare Other

## 2022-07-29 DIAGNOSIS — Z79899 Other long term (current) drug therapy: Secondary | ICD-10-CM | POA: Diagnosis not present

## 2022-07-29 NOTE — Progress Notes (Unsigned)
   Subjective:    Patient ID: David Dougherty, male    DOB: [birth year], 71 y.o.   MRN: [id number]  HPI Nurse visit warfarin 5 mg 2 tabs daily except 1 1/2 on Saturday  INR 3.4    Review of Systems     Objective:   Physical Exam        Assessment & Plan:

## 2022-08-10 ENCOUNTER — Telehealth: Payer: Self-pay | Admitting: Family Medicine

## 2022-08-10 DIAGNOSIS — R972 Elevated prostate specific antigen [PSA]: Secondary | ICD-10-CM

## 2022-08-10 NOTE — Telephone Encounter (Signed)
My chart message sent to patient. Referral placed in Epic

## 2022-08-10 NOTE — Telephone Encounter (Signed)
Nurses  When the patient was in recently we reviewed over his recent blood work from July which showed a PSA of 3.9.  I reached out to [facility] to see when was the last time he was seen and whether or not they felt he needed to see him again.  Please let the patient know the above and that I was doing chart review and reviewed over his MRI of the prostate that the urologist did on him back in December 2022.  I communicated with Dr. Junious Silk urologist regarding recent PSA and also his MRI.  They stated that it would be a very good idea for them to do a follow-up visit this fall.  Please go ahead with referral to Dr. Junious Silk for follow-up visit regarding the MRI as well as a follow-up opinion on the prostate.

## 2022-08-12 ENCOUNTER — Ambulatory Visit (INDEPENDENT_AMBULATORY_CARE_PROVIDER_SITE_OTHER): Payer: Medicare Other

## 2022-08-12 DIAGNOSIS — Z7901 Long term (current) use of anticoagulants: Secondary | ICD-10-CM

## 2022-08-12 LAB — POCT INR: INR: 2.7 (ref 2.0–3.0)

## 2022-08-12 NOTE — Telephone Encounter (Signed)
Pt contacted and verbalized understanding. Pt would like information sent via mychart. Will send via my chart and also print out information for patient to pick up when he comes for INR nurse visit.

## 2022-08-30 ENCOUNTER — Telehealth: Payer: Self-pay

## 2022-08-30 NOTE — Telephone Encounter (Signed)
-----   Message from Festus Aloe, MD sent at 08/01/2022  8:30 AM EDT ----- See below - please schedule patient to see me next available with PSA prior. We tried before but "unable to reach by phone. Letter mailed to call office to schedule office visit" which patient hasn't done.   Thank you!    ----- Message ----- From: Kathyrn Drown, MD Sent: 07/31/2022  11:02 AM EDT To: Festus Aloe, MD  Hi Matt  I was reviewing this patient's chart when he was recently in.  His PSA looks reassuring currently.  But I saw where he had an MRI of the prostate back in December but I do not see where he had any follow-up with you.  I talked with the patient about this and the patient unfortunately is not a good historian and is also a patient that is really hard to get him to do the right thing.  If possible could you please look at your notes to see did he have follow-up regarding this?  And also do you recommend follow-up sometime this fall?  Thank you for this extra work on behalf of the patient I appreciate it  Sallee Lange Primary care

## 2022-08-30 NOTE — Telephone Encounter (Signed)
Scheduled OV with Dr. Junious Silk for Monday, October 2nd at 2:15.  Confirmed with patient and voiced understanding of appt.

## 2022-09-05 ENCOUNTER — Encounter: Payer: Self-pay | Admitting: Urology

## 2022-09-05 ENCOUNTER — Ambulatory Visit: Payer: Medicare Other | Admitting: Urology

## 2022-09-05 VITALS — BP 156/92 | HR 69

## 2022-09-05 DIAGNOSIS — N138 Other obstructive and reflux uropathy: Secondary | ICD-10-CM | POA: Diagnosis not present

## 2022-09-05 DIAGNOSIS — R3912 Poor urinary stream: Secondary | ICD-10-CM

## 2022-09-05 DIAGNOSIS — N401 Enlarged prostate with lower urinary tract symptoms: Secondary | ICD-10-CM | POA: Diagnosis not present

## 2022-09-05 DIAGNOSIS — R972 Elevated prostate specific antigen [PSA]: Secondary | ICD-10-CM | POA: Diagnosis not present

## 2022-09-05 MED ORDER — TAMSULOSIN HCL 0.4 MG PO CAPS: 0.4000 mg | ORAL_CAPSULE | Freq: Every day | ORAL | 3 refills | Status: AC

## 2022-09-05 NOTE — Progress Notes (Signed)
09/05/2022 2:14 PM   David Dougherty Apr 18, 1951 [id number]  Referring provider: Kathyrn Drown, MD [address] [place],  [place] [address]  No chief complaint on file.   HPI:  F/u -   1) PSA elevation - rising PSA of July 2020 2.9, July 2021 3.6, September 2022 4.3, November 2022 7.2. No h/o BPH. No prior bx. No FH PCa. AUASS = 6.    He has ED and took Viagra.   Today, seen for the above. He had a 50g benign prostate on DRE in 2022. A Dec 2022 pMRI showed a 1.9 cm PIRADS 3 lesion left PZ, prostate 72 grams and negative staging. His Jul 2023 PSA was down to 3.9. He has some PV dribble. NG risk includes CVA in 2022. He has a adequate stream but slow in the morning.    He is on Coumadin for afib and CAD. This is managed by Dr. Wolfgang Phoenix.    He retired from [employer] and now [employer] part time.   PMH: Past Medical History:  Diagnosis Date   Atrial fibrillation ([place])    CAD (coronary artery disease)    70% distal circumflex 11/2014    Dysrhythmia    AFib   GERD (gastroesophageal reflux disease)    Nonischemic cardiomyopathy (HCC)    Tobacco abuse    Snuff    Surgical History: Past Surgical History:  Procedure Laterality Date   APPENDECTOMY     CATARACT EXTRACTION W/PHACO Left 11/18/2019   Procedure: CATARACT EXTRACTION PHACO AND INTRAOCULAR LENS PLACEMENT ([place]);  Surgeon: [name], MD;  Location: [facility];  Service: Ophthalmology;  Laterality: Left;  CDE: 9.66   CATARACT EXTRACTION W/PHACO Right 12/04/2019   Procedure: CATARACT EXTRACTION PHACO AND INTRAOCULAR LENS PLACEMENT RIGHT EYE (CDE: 4.56);  Surgeon: [name], MD;  Location: [facility];  Service: Ophthalmology;  Laterality: Right;   COLONOSCOPY  2005   Neg   COLONOSCOPY N/A 01/05/2015   SLF:17 colon polyps removed/moderate sized hemorrhoids   COLONOSCOPY WITH PROPOFOL N/A 06/19/2018   Procedure: COLONOSCOPY WITH PROPOFOL;  Surgeon: Danie Binder, MD;  Location: [facility];  Service: Endoscopy;  Laterality: N/A;  7:30am   ESOPHAGOGASTRODUODENOSCOPY N/A 01/05/2015   CHE:NIDPOEUMP at gastro junction/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/small HH/mild erosive gastrtis and duodenitis   ESOPHAGOGASTRODUODENOSCOPY (EGD) WITH PROPOFOL N/A 06/19/2018   Procedure: ESOPHAGOGASTRODUODENOSCOPY (EGD) WITH PROPOFOL;  Surgeon: Danie Binder, MD;  [facility];  Service: Endoscopy;  Laterality: N/A;   HEMORRHOID BANDING N/A 01/05/2015   Procedure: HEMORRHOID BANDING;  Surgeon: Danie Binder, MD;  [facility];  Service: Endoscopy;  Laterality: N/A;   LEFT AND RIGHT HEART CATHETERIZATION WITH CORONARY ANGIOGRAM N/A 12/03/2014   Procedure: LEFT AND RIGHT HEART CATHETERIZATION WITH CORONARY ANGIOGRAM;  Surgeon: Blane Ohara, MD;  Location[facility] CATH LAB;  Service: Cardiovascular;  Laterality: N/A;   POLYPECTOMY  06/19/2018   Procedure: POLYPECTOMY;  Surgeon: Danie Binder, MD;  [facility];  Service: Endoscopy;;  rectal    SAVORY DILATION  06/19/2018   Procedure: SAVORY DILATION;  Surgeon: Danie Binder, MD;  [facility];  Service: Endoscopy;;   TONSILLECTOMY      Home Medications:  Allergies as of 09/05/2022       Reactions   Reglan [metoclopramide] Anaphylaxis        Medication List        Accurate as of September 05, 2022  2:14 PM. If  you have any questions, ask your nurse or doctor.          atorvastatin 40 MG tablet Commonly known as: LIPITOR Take 1 tablet (40 mg total) by mouth daily.   calcium carbonate 500 MG chewable tablet Commonly known as: TUMS - dosed in mg elemental calcium Chew 2-3 tablets by mouth as needed for indigestion or heartburn.   carvedilol 3.125 MG tablet Commonly known as: COREG Take 1 tablet (3.125 mg total) by mouth 2 (two) times daily.   cyclobenzaprine 5 MG tablet Commonly known as: FLEXERIL 1 qhs prn What changed:  how much to take how to take this when to take this reasons to  take this additional instructions   fluticasone 50 MCG/ACT nasal spray Commonly known as: Flonase Place 2 sprays into both nostrils daily.   furosemide 20 MG tablet Commonly known as: LASIX Take 1 tablet (20 mg total) by mouth daily.   HYDROcodone-acetaminophen 5-325 MG tablet Commonly known as: NORCO/VICODIN Take 1 tablet by mouth every 6 (six) hours as needed for moderate pain.   lidocaine 5 % Commonly known as: Lidoderm Place 1 patch onto the skin daily. Remove & Discard patch within 12 hours or as directed by MD   losartan 25 MG tablet Commonly known as: COZAAR Take 1 tablet (25 mg total) by mouth daily.   nitroGLYCERIN 0.4 MG SL tablet Commonly known as: NITROSTAT PLACE 1 TAB UNDER TONGUE EVERY 5 MIN IF NEEDED FOR CHEST PAIN. MAY USE 3 TIMES.NO RELIEF CALL 911. Strength: 0.4 mg   omeprazole 20 MG capsule Commonly known as: PRILOSEC Take 1 capsule by mouth once daily   oxymetazoline 0.05 % nasal spray Commonly known as: AFRIN Place 1 spray into both nostrils 2 (two) times daily as needed for congestion.   rOPINIRole 3 MG tablet Commonly known as: REQUIP Take 1 tablet (3 mg total) by mouth at bedtime.   tiZANidine 2 MG tablet Commonly known as: ZANAFLEX Take 1 tablet (2 mg total) by mouth every 8 (eight) hours as needed for muscle spasms.   topiramate 50 MG tablet Commonly known as: TOPAMAX Take 1 tablet (50 mg total) by mouth 2 (two) times daily.   Vitamin D-3 125 MCG (5000 UT) Tabs Take 5,000 Units by mouth daily.   warfarin 5 MG tablet Commonly known as: COUMADIN Take as directed by the anticoagulation clinic. If you are unsure how to take this medication, talk to your nurse or doctor. Original instructions: TAKE 1 TO 2 TABLETS BY MOUTH ONCE DAILY BASED ON INR - DOSE CHANGE        Allergies:  Allergies  Allergen Reactions   Reglan [Metoclopramide] Anaphylaxis    Family History: Family History  Problem Relation Age of Onset   Heart attack  Father    Hypertension Father    Diabetes Father    Colon cancer Neg Hx    Colon polyps Neg Hx     Social History:  reports that he quit smoking about 49 years ago. His smoking use included cigars and cigarettes. He started smoking about 52 years ago. He has a 0.75 pack-year smoking history. He has quit using smokeless tobacco.  His smokeless tobacco use included snuff. He reports that he does not drink alcohol and does not use drugs.   Physical Exam: There were no vitals taken for this visit.  Constitutional:  Alert and oriented, No acute distress. HEENT: [place] AT, moist mucus membranes.  Trachea midline, no masses. Cardiovascular: No clubbing, cyanosis, or edema.  Respiratory: Normal respiratory effort, no increased work of breathing. GI: Abdomen is soft, nontender, nondistended, no abdominal masses GU: No CVA tenderness Lymph: No cervical or inguinal lymphadenopathy. Skin: No rashes, bruises or suspicious lesions. Neurologic: Grossly intact, no focal deficits, moving all 4 extremities. Psychiatric: Normal mood and affect. DRE: prostate 50 g and smooth with no hard area or nodule   Laboratory Data: Lab Results  Component Value Date   WBC 4.8 06/14/2022   HGB 13.7 06/14/2022   HCT 41.2 06/14/2022   MCV 87 06/14/2022   PLT 187 06/14/2022    Lab Results  Component Value Date   CREATININE 0.99 06/14/2022    Lab Results  Component Value Date   PSA 4.00 11/20/2014   PSA 4.60 (H) 11/13/2014    No results found for: "TESTOSTERONE"  Lab Results  Component Value Date   HGBA1C 6.0 (H) 04/15/2020    Urinalysis    Component Value Date/Time   COLORURINE COLORLESS (A) 04/15/2020 1354   APPEARANCEUR Clear 10/25/2021 1429   LABSPEC 1.019 04/15/2020 1354   PHURINE 7.0 04/15/2020 1354   GLUCOSEU Negative 10/25/2021 1429   HGBUR NEGATIVE 04/15/2020 1354   BILIRUBINUR Negative 10/25/2021 [place] 04/15/2020 1354   PROTEINUR Negative 10/25/2021 1429    PROTEINUR NEGATIVE 04/15/2020 1354   NITRITE Negative 10/25/2021 1429   NITRITE NEGATIVE 04/15/2020 1354   LEUKOCYTESUR Negative 10/25/2021 1429   LEUKOCYTESUR NEGATIVE 04/15/2020 1354    Lab Results  Component Value Date   LABMICR Comment 10/25/2021    Pertinent Imaging: pMRI images reviewed   No valid procedures specified. No results found for this or any previous visit.  No results found for this or any previous visit.   Assessment & Plan:    1) PSA elevation -  DRE remains benign and PSAD normal. Disc the nature of the PIRADs scale and the nature r/b/a to TRUS prostate bx. PSA was sent.   2) BPH, LUTS - we disc the nature r/b/a to tamsulosin. He will proceed.   No follow-ups on file.  Festus Aloe, MD  [facility]  [address] [place], [place] [address] [phone]

## 2022-09-06 LAB — URINALYSIS, ROUTINE W REFLEX MICROSCOPIC
Bilirubin, UA: NEGATIVE
Ketones, UA: NEGATIVE
Leukocytes,UA: NEGATIVE
Nitrite, UA: NEGATIVE
Protein,UA: NEGATIVE
Specific Gravity, UA: 1.015 (ref 1.005–1.030)
Urobilinogen, Ur: 0.2 mg/dL (ref 0.2–1.0)
pH, UA: 5 (ref 5.0–7.5)

## 2022-09-06 LAB — MICROSCOPIC EXAMINATION
Bacteria, UA: NONE SEEN
Epithelial Cells (non renal): NONE SEEN /hpf (ref 0–10)
WBC, UA: NONE SEEN /hpf (ref 0–5)

## 2022-09-06 LAB — PSA: Prostate Specific Ag, Serum: 4.6 ng/mL — ABNORMAL HIGH (ref 0.0–4.0)

## 2022-09-13 ENCOUNTER — Ambulatory Visit (INDEPENDENT_AMBULATORY_CARE_PROVIDER_SITE_OTHER): Payer: Medicare Other

## 2022-09-13 ENCOUNTER — Telehealth: Payer: Self-pay | Admitting: Family Medicine

## 2022-09-13 DIAGNOSIS — Z7901 Long term (current) use of anticoagulants: Secondary | ICD-10-CM | POA: Diagnosis not present

## 2022-09-13 LAB — POCT INR: INR: 2.8 (ref 2.0–3.0)

## 2022-09-13 NOTE — Patient Instructions (Signed)
Continue same treatment. Recheck INR in 4 weeks.

## 2022-09-13 NOTE — Telephone Encounter (Signed)
Pt requesting refill on Ropinirole 2 mg tablets. Pt had 1 1/2 tablet written on bottle but states he takes one tablet of the 2 mg. Pt states he was placed on 3 mg but that messed his INR up. [facility]. Please advise. Thank you

## 2022-09-14 NOTE — Telephone Encounter (Signed)
May have Requip 2 mg, #30, 4 refills, 1 each evening To be cautious we need to do a follow-up INR within 7 days of the restarting the 2 mg to be on the safe side

## 2022-09-15 ENCOUNTER — Other Ambulatory Visit: Payer: Self-pay | Admitting: Family Medicine

## 2022-09-16 ENCOUNTER — Other Ambulatory Visit: Payer: Self-pay

## 2022-09-16 MED ORDER — ROPINIROLE HCL 2 MG PO TABS: 2.0000 mg | ORAL_TABLET | Freq: Every day | ORAL | 5 refills | Status: DC

## 2022-09-16 NOTE — Telephone Encounter (Signed)
Contacted patient to inform per drs recommendations , pt states he has already been on Requip 2 mg since they stopped Requip 3 mg , he already had this medication and started taking it, just needs a refill,  and he would not need a recheck in 7 days unless the doctor recommends otherwise, please advise.

## 2022-09-16 NOTE — Telephone Encounter (Signed)
Med refills sent in by [facility] LPN

## 2022-09-16 NOTE — Telephone Encounter (Signed)
Since he has already been on it He may continue the 2 mg May have 6 refill Does not need to do another INR is set up for his every 4 weeks INR

## 2022-09-19 ENCOUNTER — Ambulatory Visit: Payer: Self-pay | Admitting: Family Medicine

## 2022-10-11 ENCOUNTER — Ambulatory Visit (INDEPENDENT_AMBULATORY_CARE_PROVIDER_SITE_OTHER): Payer: Medicare Other | Admitting: *Deleted

## 2022-10-11 DIAGNOSIS — Z7901 Long term (current) use of anticoagulants: Secondary | ICD-10-CM

## 2022-10-11 LAB — POCT INR: INR: 3.2 — AB (ref 2.0–3.0)

## 2022-10-15 IMAGING — MR MR PROSTATE WO/W CM
12 series · 48 of 48 positions shown · IV contrast (10 GADAVIST)
Comparison: None.

CLINICAL DATA: Enlarged prostate with elevated PSA level.

EXAM:
MR PROSTATE WITHOUT AND WITH CONTRAST
TECHNIQUE: Multiplanar multisequence MRI images were obtained of the pelvis
centered about the prostate. Pre and post contrast images were
obtained.
CONTRAST:  10mL GADAVIST GADOBUTROL 1 MMOL/ML IV SOLN

[Series 3: T1 · axial · 5.0mm · 1.19mm/px · 1 of 72 slices shown (1 of 2)]
[im 1/72]
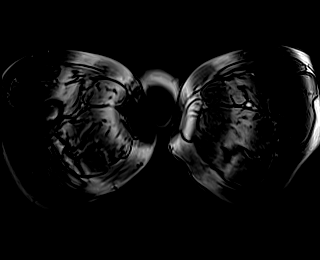

[Series 4: T1 · axial · 5.0mm · 1.19mm/px · 1 of 72 slices shown (2 of 2)]
[im 1/72]
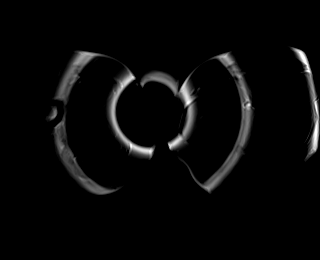

[Series 5: T2 · axial · 3.0mm · 0.47mm/px · 1 of 34 slices shown (1 of 3)]
[im 1/34]
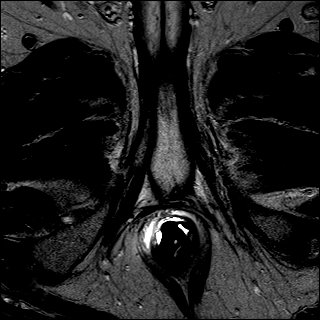

[Series 6: T2 · coronal · 3.0mm · 0.47mm/px · 1 of 32 slices shown (2 of 3)]
[im 1/32]
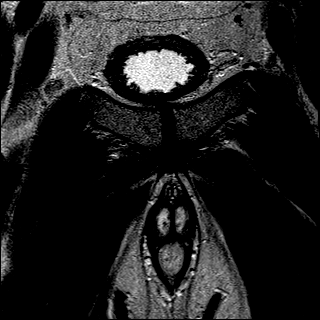

[Series 7: T2 · axial · 1.0mm · 1.00mm/px · z∈[-72,+31]mm · 2 of 104 slices shown (3 of 3)]
[im 1/104]
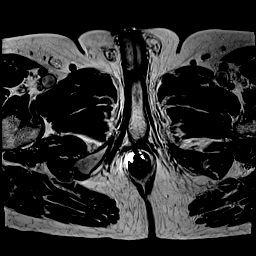
[im 104/104]
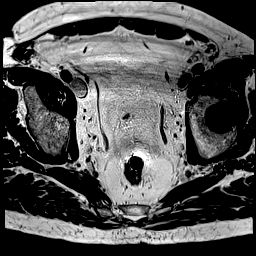

[Series 8: ep2d_diff_b100_500_800_tra_endo**_tracew_dfc_mix · axial · 3.0mm · 1.60mm/px · z∈[-81,+18]mm · 2 of 100 slices shown]
[im 1/100]
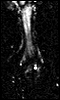
[im 100/100]
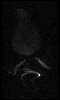

[Series 9: ep2d_diff_b100_500_800_tra_endo**_adc_dfc_mix · axial · 3.0mm · 1.60mm/px · 1 of 34 slices shown]
[im 1/34]
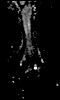

[Series 10: ep2d_diff_b100_500_800_tra_endo**_calc_bval_dfc_mix · axial · 3.0mm · 1.60mm/px · 1 of 34 slices shown]
[im 1/34]
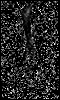

[Series 11: ep2d_diff_bvalue (id) · axial · 3.0mm · 1.60mm/px · 1 of 39 slices shown]
[im 1/39]
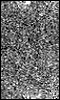

[Series 12: axial multiphase · axial · 3.0mm · 0.98mm/px · z∈[-84,+33]mm · 18 of 800 slices shown]
[im 1/800]
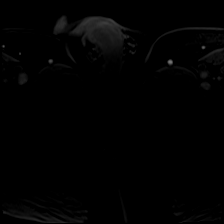
[im 48/800]
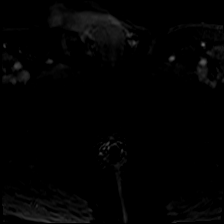
[im 95/800]
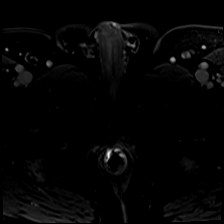
[im 142/800]
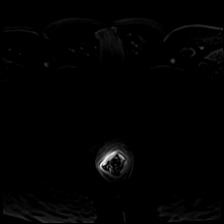
[im 189/800]
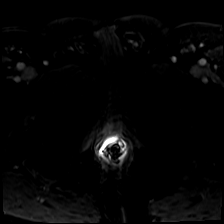
[im 236/800]
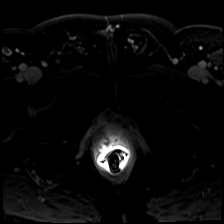
[im 283/800]
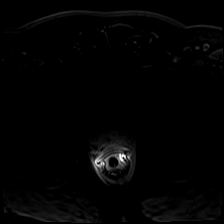
[im 330/800]
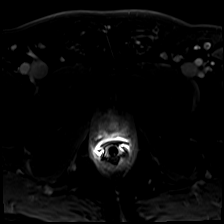
[im 377/800]
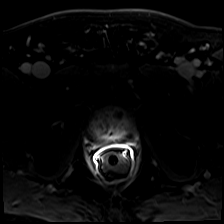
[im 424/800]
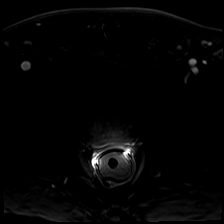
[im 471/800]
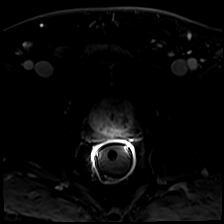
[im 518/800]
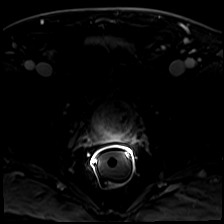
[im 565/800]
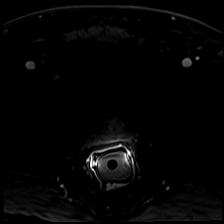
[im 612/800]
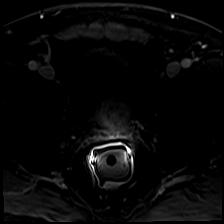
[im 659/800]
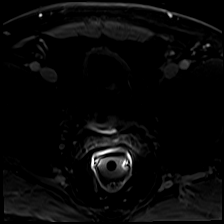
[im 706/800]
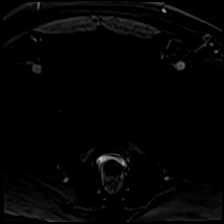
[im 753/800]
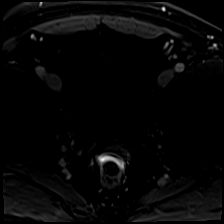
[im 800/800]
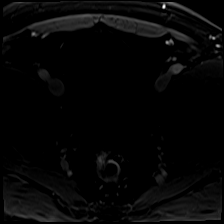

[Series 13: axial multiphase_sub · axial · 3.0mm · 0.98mm/px · z∈[-84,+33]mm · 17 of 760 slices shown]
[im 1/760]
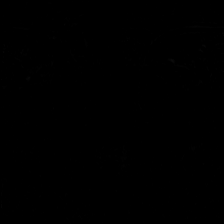
[im 48/760]
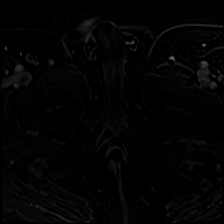
[im 95/760]
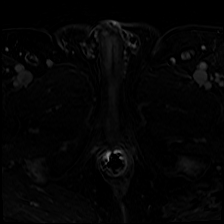
[im 143/760]
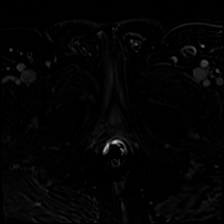
[im 190/760]
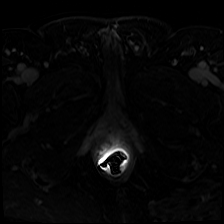
[im 238/760]
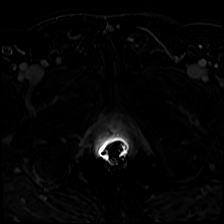
[im 285/760]
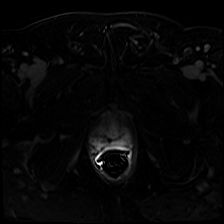
[im 333/760]
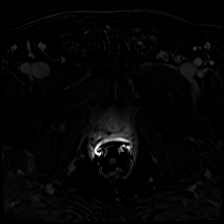
[im 380/760]
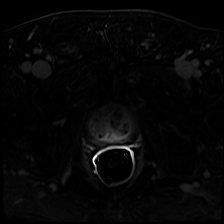
[im 427/760]
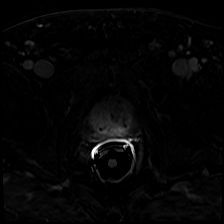
[im 475/760]
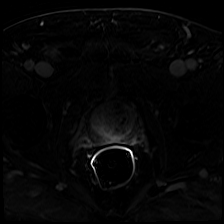
[im 522/760]
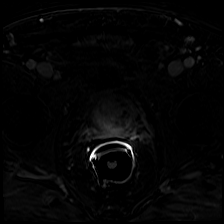
[im 570/760]
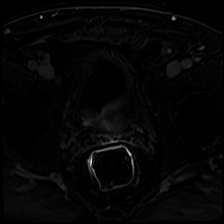
[im 617/760]
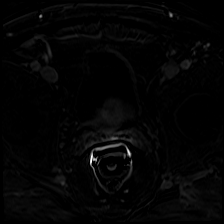
[im 665/760]
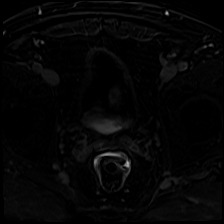
[im 712/760]
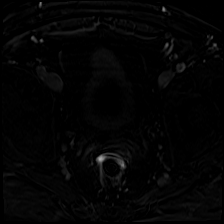
[im 760/760]
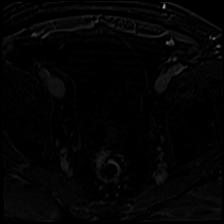

[Series 15: iliac crest thru · axial · 2.5mm · 1.19mm/px · z∈[-103,+155]mm · 2 of 104 slices shown]
[im 1/104]
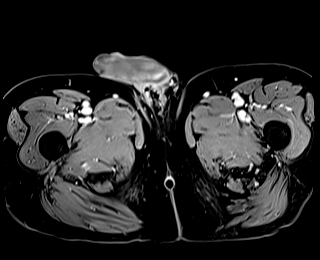
[im 104/104]
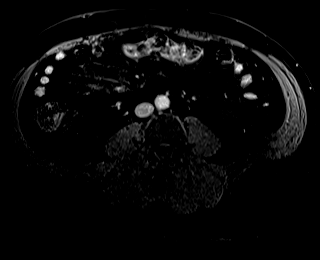

[48 of 48 positions shown; findings below may reference images not displayed]

FINDINGS: Despite efforts by the technologist and patient, motion artifact is
present on today's exam and could not be eliminated. This reduces
exam sensitivity and specificity.

Prostate:

Encapsulated nodularity in the transition zone.

Region of interest-take 1: PI-RADS category 3 lesion of the left
anterior and posterolateral peripheral zone in the mid gland and of
the anterior peripheral zone in the apex, with reduced T2 signal but
no focal early enhancement or restricted diffusion. This measures
0.67 cc (1.3 by 0.7 by 1.9 cm) and is shown for example on image 16
series 5.

Volume: 3D volumetric assessment: Prostate volume 72.31 cc (5.8 by
5.1 by 5.9 cm).

Transcapsular spread:  Absent

Seminal vesicle involvement: Absent

Neurovascular bundle involvement: Absent

Pelvic adenopathy: Absent

Bone metastasis: Absent

Other findings: No supplemental non-categorized findings.
IMPRESSION: 1. Single PI-RADS category 3 lesion in the left peripheral zone.
Targeting data sent to [facility].
2. Benign prostatic hypertrophy and prostatomegaly.

## 2022-10-25 ENCOUNTER — Ambulatory Visit (INDEPENDENT_AMBULATORY_CARE_PROVIDER_SITE_OTHER): Payer: Medicare Other

## 2022-10-25 DIAGNOSIS — Z7901 Long term (current) use of anticoagulants: Secondary | ICD-10-CM

## 2022-10-25 LAB — POCT INR: POC INR: 2.7

## 2022-11-08 ENCOUNTER — Ambulatory Visit: Payer: Medicare Other

## 2022-11-17 ENCOUNTER — Ambulatory Visit: Payer: Medicare Other | Admitting: Family Medicine

## 2022-11-23 ENCOUNTER — Ambulatory Visit: Payer: Medicare Other

## 2022-11-30 ENCOUNTER — Ambulatory Visit (INDEPENDENT_AMBULATORY_CARE_PROVIDER_SITE_OTHER): Payer: Medicare Other | Admitting: Family Medicine

## 2022-11-30 VITALS — BP 108/76 | HR 73 | Temp 98.3°F | Wt 212.4 lb

## 2022-11-30 DIAGNOSIS — Z7901 Long term (current) use of anticoagulants: Secondary | ICD-10-CM | POA: Diagnosis not present

## 2022-11-30 DIAGNOSIS — M79672 Pain in left foot: Secondary | ICD-10-CM

## 2022-11-30 DIAGNOSIS — I480 Paroxysmal atrial fibrillation: Secondary | ICD-10-CM | POA: Diagnosis not present

## 2022-11-30 DIAGNOSIS — I25118 Atherosclerotic heart disease of native coronary artery with other forms of angina pectoris: Secondary | ICD-10-CM

## 2022-11-30 DIAGNOSIS — E7849 Other hyperlipidemia: Secondary | ICD-10-CM | POA: Diagnosis not present

## 2022-11-30 LAB — POCT INR: POC INR: 2.4

## 2022-11-30 MED ORDER — HYDROCODONE-ACETAMINOPHEN 5-325 MG PO TABS: 1.0000 | ORAL_TABLET | Freq: Four times a day (QID) | ORAL | 0 refills | Status: DC | PRN

## 2022-11-30 MED ORDER — PREGABALIN 25 MG PO CAPS: ORAL_CAPSULE | ORAL | 4 refills | Status: DC

## 2022-11-30 NOTE — Progress Notes (Signed)
   Subjective:    Patient ID: David Dougherty, male    DOB: 05-Apr-1951, 71 y.o.   MRN: [id number]  HPI Patient arrives today for follow up and INR.  Patient states no concerns or issues today. Patient having ongoing foot pain discomfort.  Makes it difficult for him to do the type of work he would like to be able to do around the yard and such.  Has to wear a boot.  Seen a podiatrist.  They were concerned about a fracture in the foot but it is now they are being told that there is no fracture He also wonders if it could be restless legs but he describes more of a aching burning sensation in the foot and lower leg worse on the left than the right  Review of Systems     Objective:   Physical Exam  General-in no acute distress Eyes-no discharge Lungs-respiratory rate normal, CTA CV-no murmurs Extremities skin warm dry no edema Neuro grossly normal Behavior normal, alert       Assessment & Plan:  1. Encounter for current long-term use of anticoagulants INR looks good continue current measures - INR  2. Left foot pain I am not convinced that Requip is helping his system so therefore stop it we will try Lyrica 25 mg at supper 25 mg at bedtime follow-up 4 weeks to recheck how his foot pain is doing and how the restlessness in his legs are doing at nighttime  3. Atherosclerotic heart disease of native coronary artery with other forms of angina pectoris [facility]) Continue medication the cardiologist has him on  4. Other hyperlipidemia Continue statin  5. Paroxysmal atrial fibrillation ([place]) Continue medication cardiologist as long  Follow-up again in 4 weeks as discussed above

## 2022-12-30 ENCOUNTER — Ambulatory Visit (INDEPENDENT_AMBULATORY_CARE_PROVIDER_SITE_OTHER): Payer: Medicare Other | Admitting: Family Medicine

## 2022-12-30 VITALS — BP 128/70 | HR 82 | Temp 99.1°F | Wt 214.6 lb

## 2022-12-30 DIAGNOSIS — Z7901 Long term (current) use of anticoagulants: Secondary | ICD-10-CM

## 2022-12-30 DIAGNOSIS — I25118 Atherosclerotic heart disease of native coronary artery with other forms of angina pectoris: Secondary | ICD-10-CM

## 2022-12-30 DIAGNOSIS — I480 Paroxysmal atrial fibrillation: Secondary | ICD-10-CM | POA: Diagnosis not present

## 2022-12-30 DIAGNOSIS — Z79899 Other long term (current) drug therapy: Secondary | ICD-10-CM

## 2022-12-30 DIAGNOSIS — R972 Elevated prostate specific antigen [PSA]: Secondary | ICD-10-CM

## 2022-12-30 DIAGNOSIS — E7849 Other hyperlipidemia: Secondary | ICD-10-CM

## 2022-12-30 DIAGNOSIS — I10 Essential (primary) hypertension: Secondary | ICD-10-CM

## 2022-12-30 LAB — POCT INR: POC INR: 1.9

## 2022-12-30 NOTE — Progress Notes (Signed)
   Subjective:    Patient ID: David Dougherty, male    DOB: 1951-04-01, 72 y.o.   MRN: [id number]  HPI Patient arrives today for follow up and INR  Encounter for current long-term use of anticoagulants - Plan: INR  Other hyperlipidemia - Plan: Lipid panel  Essential hypertension - Plan: Basic Metabolic Panel (7), Hepatic Function Panel  Elevated PSA - Plan: PSA  Long term current use of diuretic - Plan: Basic Metabolic Panel (7), Hepatic Function Panel  Paroxysmal atrial fibrillation (HCC), Chronic  Atherosclerotic heart disease of native coronary artery with other forms of angina pectoris ([place]), Chronic ' He does state he tries eat healthy Minimizes salt Takes his medicines regular basis Does need lab work Does need follow-up on his PSA has been elevated in the past and he has not followed through with seeing urology We will recheck this lab work In addition to this intermittent atrial fibs on Coumadin has not wanted to be on Eliquis in the past   Review of Systems     Objective:   Physical Exam General-in no acute distress Eyes-no discharge Lungs-respiratory rate normal, CTA CV-no murmurs, A-fib rate controlled Extremities skin warm dry no edema Neuro grossly normal Behavior normal, alert        Assessment & Plan:  1. Other hyperlipidemia Check lab work continue medication - Lipid panel  2. Essential hypertension Continue medication watch diet stay active - Basic Metabolic Panel (7) - Hepatic Function Panel  3. Elevated PSA Follows with urology check lab work - PSA  4. Long term current use of diuretic Check lab work - Basic Metabolic Panel (7) - Hepatic Function Panel  5. Encounter for current long-term use of anticoagulants INR reasonable repeat again in 2 weeks time adjust medication see chart at INR station - INR  6. Paroxysmal atrial fibrillation (HCC) Rate is controlled continue current medications  7. Atherosclerotic heart disease of  native coronary artery with other forms of angina pectoris ([place]) Is out of breath when he pushes himself too hard but otherwise tolerates things well  Restless legs and leg pain does better with Lyrica therefore continue that for now stop Requip  INR regular basis medical checks every 6 months

## 2023-01-27 LAB — HEPATIC FUNCTION PANEL
ALT: 19 IU/L (ref 0–44)
AST: 18 IU/L (ref 0–40)
Albumin: 4.3 g/dL (ref 3.8–4.8)
Alkaline Phosphatase: 82 IU/L (ref 44–121)
Bilirubin Total: 0.4 mg/dL (ref 0.0–1.2)
Bilirubin, Direct: 0.15 mg/dL (ref 0.00–0.40)
Total Protein: 6.8 g/dL (ref 6.0–8.5)

## 2023-01-27 LAB — LIPID PANEL
Chol/HDL Ratio: 3.1 ratio (ref 0.0–5.0)
Cholesterol, Total: 118 mg/dL (ref 100–199)
HDL: 38 mg/dL — ABNORMAL LOW (ref >39)
LDL Chol Calc (NIH): 59 mg/dL (ref 0–99)
Triglycerides: 117 mg/dL (ref 0–149)
VLDL Cholesterol Cal: 21 mg/dL (ref 5–40)

## 2023-01-27 LAB — PSA: Prostate Specific Ag, Serum: 3.5 ng/mL (ref 0.0–4.0)

## 2023-01-27 LAB — BASIC METABOLIC PANEL (7)
BUN/Creatinine Ratio: 19 (ref 10–24)
BUN: 20 mg/dL (ref 8–27)
CO2: 18 mmol/L — ABNORMAL LOW (ref 20–29)
Chloride: 107 mmol/L — ABNORMAL HIGH (ref 96–106)
Creatinine, Ser: 1.08 mg/dL (ref 0.76–1.27)
Glucose: 96 mg/dL (ref 70–99)
Potassium: 4.1 mmol/L (ref 3.5–5.2)
Sodium: 142 mmol/L (ref 134–144)
eGFR: 73 mL/min/{1.73_m2} (ref >59)

## 2023-02-01 ENCOUNTER — Ambulatory Visit (INDEPENDENT_AMBULATORY_CARE_PROVIDER_SITE_OTHER): Payer: Medicare Other | Admitting: Family Medicine

## 2023-02-01 DIAGNOSIS — Z7901 Long term (current) use of anticoagulants: Secondary | ICD-10-CM

## 2023-02-01 LAB — POCT INR: INR: 2.3 (ref 2.0–3.0)

## 2023-02-01 NOTE — Progress Notes (Signed)
   Subjective:    Patient ID: David Dougherty, male    DOB: 02/19/1951, 72 y.o.   MRN: [id number]  HPI Patient arrives and defers office visit wants to get INR. Patient has been notified he has an office visit in July.    Review of Systems     Objective:   Physical Exam        Assessment & Plan:   Patient denies any problems with his legs states that he just wants to do INR does not want an office visit

## 2023-02-02 ENCOUNTER — Other Ambulatory Visit: Payer: Self-pay | Admitting: Family Medicine

## 2023-02-27 ENCOUNTER — Other Ambulatory Visit: Payer: Self-pay | Admitting: Family Medicine

## 2023-03-01 ENCOUNTER — Other Ambulatory Visit: Payer: Self-pay | Admitting: Family Medicine

## 2023-03-02 ENCOUNTER — Ambulatory Visit (INDEPENDENT_AMBULATORY_CARE_PROVIDER_SITE_OTHER): Payer: Medicare Other

## 2023-03-02 DIAGNOSIS — Z7901 Long term (current) use of anticoagulants: Secondary | ICD-10-CM | POA: Diagnosis not present

## 2023-03-02 LAB — POCT INR: POC INR: 2.9

## 2023-03-13 ENCOUNTER — Other Ambulatory Visit: Payer: Self-pay | Admitting: Family Medicine

## 2023-03-20 ENCOUNTER — Other Ambulatory Visit: Payer: Self-pay | Admitting: Family Medicine

## 2023-03-23 NOTE — Patient Instructions (Signed)
David Dougherty , Thank you for taking time to come for your Medicare Wellness Visit. I appreciate your ongoing commitment to your health goals. Please review the following plan we discussed and let me know if I can assist you in the future.   These are the goals we discussed:  Goals      Exercise 150 min/wk Moderate Activity     Continue to exercise and stay healthy. Travel more, has cruise scheduled for Jan 2024.        This is a list of the screening recommended for you and due dates:  Health Maintenance  Topic Date Due   Hepatitis C Screening: USPSTF Recommendation to screen - Ages 53-79 yo.  Never done   DTaP/Tdap/Td vaccine (1 - Tdap) Never done   Zoster (Shingles) Vaccine (1 of 2) Never done   COVID-19 Vaccine (3 - Moderna risk series) 04/06/2020   Medicare Annual Wellness Visit  03/09/2023   Colon Cancer Screening  06/20/2023   Flu Shot  07/06/2023   Pneumonia Vaccine  Completed   HPV Vaccine  Aged Out    Advanced directives: Forms are available if you choose in the future to pursue completion.  This is recommended in order to make sure that your health wishes are honored in the event that you are unable to verbalize them to the provider.    Conditions/risks identified: Aim for 30 minutes of exercise or brisk walking, 6-8 glasses of water, and 5 servings of fruits and vegetables each day.  Next appointment: Follow up in one year for your annual wellness visit.   Preventive Care 8 Years and Older, Male  Preventive care refers to lifestyle choices and visits with your health care provider that can promote health and wellness. What does preventive care include? A yearly physical exam. This is also called an annual well check. Dental exams once or twice a year. Routine eye exams. Ask your health care provider how often you should have your eyes checked. Personal lifestyle choices, including: Daily care of your teeth and gums. Regular physical activity. Eating a healthy  diet. Avoiding tobacco and drug use. Limiting alcohol use. Practicing safe sex. Taking low doses of aspirin every day. Taking vitamin and mineral supplements as recommended by your health care provider. What happens during an annual well check? The services and screenings done by your health care provider during your annual well check will depend on your age, overall health, lifestyle risk factors, and family history of disease. Counseling  Your health care provider may ask you questions about your: Alcohol use. Tobacco use. Drug use. Emotional well-being. Home and relationship well-being. Sexual activity. Eating habits. History of falls. Memory and ability to understand (cognition). Work and work Astronomer. Screening  You may have the following tests or measurements: Height, weight, and BMI. Blood pressure. Lipid and cholesterol levels. These may be checked every 5 years, or more frequently if you are over 20 years old. Skin check. Lung cancer screening. You may have this screening every year starting at age 75 if you have a 30-pack-year history of smoking and currently smoke or have quit within the past 15 years. Fecal occult blood test (FOBT) of the stool. You may have this test every year starting at age 19. Flexible sigmoidoscopy or colonoscopy. You may have a sigmoidoscopy every 5 years or a colonoscopy every 10 years starting at age 50. Prostate cancer screening. Recommendations will vary depending on your family history and other risks. Hepatitis C blood test. Hepatitis  B blood test. Sexually transmitted disease (STD) testing. Diabetes screening. This is done by checking your blood sugar (glucose) after you have not eaten for a while (fasting). You may have this done every 1-3 years. Abdominal aortic aneurysm (AAA) screening. You may need this if you are a current or former smoker. Osteoporosis. You may be screened starting at age 46 if you are at high risk. Talk with  your health care provider about your test results, treatment options, and if necessary, the need for more tests. Vaccines  Your health care provider may recommend certain vaccines, such as: Influenza vaccine. This is recommended every year. Tetanus, diphtheria, and acellular pertussis (Tdap, Td) vaccine. You may need a Td booster every 10 years. Zoster vaccine. You may need this after age 48. Pneumococcal 13-valent conjugate (PCV13) vaccine. One dose is recommended after age 58. Pneumococcal polysaccharide (PPSV23) vaccine. One dose is recommended after age 17. Talk to your health care provider about which screenings and vaccines you need and how often you need them. This information is not intended to replace advice given to you by your health care provider. Make sure you discuss any questions you have with your health care provider. Document Released: 12/18/2015 Document Revised: 08/10/2016 Document Reviewed: 09/22/2015 Elsevier Interactive Patient Education  2017 [place] Prevention in the Home Falls can cause injuries. They can happen to people of all ages. There are many things you can do to make your home safe and to help prevent falls. What can I do on the outside of my home? Regularly fix the edges of walkways and driveways and fix any cracks. Remove anything that might make you trip as you walk through a door, such as a raised step or threshold. Trim any bushes or trees on the path to your home. Use bright outdoor lighting. Clear any walking paths of anything that might make someone trip, such as rocks or tools. Regularly check to see if handrails are loose or broken. Make sure that both sides of any steps have handrails. Any raised decks and porches should have guardrails on the edges. Have any leaves, snow, or Dougherty cleared regularly. Use sand or salt on walking paths during winter. Clean up any spills in your garage right away. This includes oil or grease spills. What  can I do in the bathroom? Use night lights. Install grab bars by the toilet and in the tub and shower. Do not use towel bars as grab bars. Use non-skid mats or decals in the tub or shower. If you need to sit down in the shower, use a plastic, non-slip stool. Keep the floor dry. Clean up any water that spills on the floor as soon as it happens. Remove soap buildup in the tub or shower regularly. Attach bath mats securely with double-sided non-slip rug tape. Do not have throw rugs and other things on the floor that can make you trip. What can I do in the bedroom? Use night lights. Make sure that you have a light by your bed that is easy to reach. Do not use any sheets or blankets that are too big for your bed. They should not hang down onto the floor. Have a firm chair that has side arms. You can use this for support while you get dressed. Do not have throw rugs and other things on the floor that can make you trip. What can I do in the kitchen? Clean up any spills right away. Avoid walking on wet floors. Keep  items that you use a lot in easy-to-reach places. If you need to reach something above you, use a strong step stool that has a grab bar. Keep electrical cords out of the way. Do not use floor polish or wax that makes floors slippery. If you must use wax, use non-skid floor wax. Do not have throw rugs and other things on the floor that can make you trip. What can I do with my stairs? Do not leave any items on the stairs. Make sure that there are handrails on both sides of the stairs and use them. Fix handrails that are broken or loose. Make sure that handrails are as long as the stairways. Check any carpeting to make sure that it is firmly attached to the stairs. Fix any carpet that is loose or worn. Avoid having throw rugs at the top or bottom of the stairs. If you do have throw rugs, attach them to the floor with carpet tape. Make sure that you have a light switch at the top of the  stairs and the bottom of the stairs. If you do not have them, ask someone to add them for you. What else can I do to help prevent falls? Wear shoes that: Do not have high heels. Have rubber bottoms. Are comfortable and fit you well. Are closed at the toe. Do not wear sandals. If you use a stepladder: Make sure that it is fully opened. Do not climb a closed stepladder. Make sure that both sides of the stepladder are locked into place. Ask someone to hold it for you, if possible. Clearly mark and make sure that you can see: Any grab bars or handrails. First and last steps. Where the edge of each step is. Use tools that help you move around (mobility aids) if they are needed. These include: Canes. Walkers. Scooters. Crutches. Turn on the lights when you go into a dark area. Replace any light bulbs as soon as they burn out. Set up your furniture so you have a clear path. Avoid moving your furniture around. If any of your floors are uneven, fix them. If there are any pets around you, be aware of where they are. Review your medicines with your doctor. Some medicines can make you feel dizzy. This can increase your chance of falling. Ask your doctor what other things that you can do to help prevent falls. This information is not intended to replace advice given to you by your health care provider. Make sure you discuss any questions you have with your health care provider. Document Released: 09/17/2009 Document Revised: 04/28/2016 Document Reviewed: 12/26/2014 Elsevier Interactive Patient Education  2017 [employer].

## 2023-03-23 NOTE — Progress Notes (Signed)
Subjective:   David Dougherty is a 72 y.o. male who presents for Medicare Annual/Subsequent preventive examination.  I connected with  David Dougherty on 03/24/23 by a audio enabled telemedicine application and verified that I am speaking with the correct person using two identifiers.  Patient Location: Home  Provider Location: Office/Clinic  I discussed the limitations of evaluation and management by telemedicine. The patient expressed understanding and agreed to proceed.  Review of Systems     Cardiac Risk Factors include: advanced age (>70men, >27 women);dyslipidemia;hypertension;male gender;sedentary lifestyle     Objective:    Today's Vitals   03/24/23 1406  Weight: 214 lb (97.1 kg)  Height: 6\' 1"  (1.854 m)   Body mass index is 28.23 kg/m.     03/24/2023    2:11 PM 03/09/2022    3:02 PM 03/08/2022    2:26 PM 07/15/2021    1:22 PM 05/09/2021    1:56 PM 04/15/2020    5:29 PM 04/15/2020    1:50 PM  Advanced Directives  Does Patient Have a Medical Advance Directive? No Yes No No No No No  Type of Advance Directive  Living will;Healthcare Power of Attorney       Does patient want to make changes to medical advance directive?  No - Patient declined       Copy of Healthcare Power of Attorney in Chart?  No - copy requested       Would patient like information on creating a medical advance directive? No - Patient declined No - Patient declined No - Patient declined   No - Patient declined     Current Medications (verified) Outpatient Encounter Medications as of 03/24/2023  Medication Sig   atorvastatin (LIPITOR) 40 MG tablet Take 1 tablet (40 mg total) by mouth daily.   calcium carbonate (TUMS - DOSED IN MG ELEMENTAL CALCIUM) 500 MG chewable tablet Chew 2-3 tablets by mouth as needed for indigestion or heartburn.    carvedilol (COREG) 3.125 MG tablet Take 1 tablet by mouth twice daily   Cholecalciferol (VITAMIN D-3) 125 MCG (5000 UT) TABS Take 5,000 Units by mouth daily.    fluticasone (FLONASE) 50 MCG/ACT nasal spray Place 2 sprays into both nostrils daily.   furosemide (LASIX) 20 MG tablet Take 1 tablet (20 mg total) by mouth daily.   losartan (COZAAR) 25 MG tablet Take 1 tablet by mouth once daily   nitroGLYCERIN (NITROSTAT) 0.4 MG SL tablet PLACE 1 TAB UNDER TONGUE EVERY 5 MIN IF NEEDED FOR CHEST PAIN. MAY USE 3 TIMES.NO RELIEF CALL 911. Strength: 0.4 mg   omeprazole (PRILOSEC) 20 MG capsule Take 1 capsule by mouth once daily   oxymetazoline (AFRIN) 0.05 % nasal spray Place 1 spray into both nostrils 2 (two) times daily as needed for congestion.   pregabalin (LYRICA) 25 MG capsule 1 before supper and 1  at bedtime for foot nerve pain   tamsulosin (FLOMAX) 0.4 MG CAPS capsule Take 1 capsule (0.4 mg total) by mouth daily after supper.   topiramate (TOPAMAX) 50 MG tablet Take 1 tablet by mouth twice daily   warfarin (COUMADIN) 5 MG tablet TAKE 1 TO 2 TABLETS BY MOUTH ONCE DAILY BASED ON INR   [DISCONTINUED] HYDROcodone-acetaminophen (NORCO/VICODIN) 5-325 MG tablet Take 1 tablet by mouth every 6 (six) hours as needed for moderate pain. (Patient not taking: Reported on 03/24/2023)   No facility-administered encounter medications on file as of 03/24/2023.    Allergies (verified) Reglan [metoclopramide]   History: Past  Medical History:  Diagnosis Date   Atrial fibrillation    CAD (coronary artery disease)    70% distal circumflex 11/2014    Dysrhythmia    AFib   GERD (gastroesophageal reflux disease)    Nonischemic cardiomyopathy    Tobacco abuse    Snuff   Past Surgical History:  Procedure Laterality Date   APPENDECTOMY     CATARACT EXTRACTION W/PHACO Left 11/18/2019   Procedure: CATARACT EXTRACTION PHACO AND INTRAOCULAR LENS PLACEMENT (IOC);  Surgeon: Fabio Pierce, MD;  Location: [facility];  Service: Ophthalmology;  Laterality: Left;  CDE: 9.66   CATARACT EXTRACTION W/PHACO Right 12/04/2019   Procedure: CATARACT EXTRACTION PHACO AND INTRAOCULAR LENS  PLACEMENT RIGHT EYE (CDE: 4.56);  Surgeon: Fabio Pierce, MD;  Location: [facility];  Service: Ophthalmology;  Laterality: Right;   COLONOSCOPY  2005   Neg   COLONOSCOPY N/A 01/05/2015   SLF:17 colon polyps removed/moderate sized hemorrhoids   COLONOSCOPY WITH PROPOFOL N/A 06/19/2018   Procedure: COLONOSCOPY WITH PROPOFOL;  Surgeon: West Bali, MD;  Location: [facility];  Service: Endoscopy;  Laterality: N/A;  7:30am   ESOPHAGOGASTRODUODENOSCOPY N/A 01/05/2015   [id number] at gastro junction/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/small HH/mild erosive gastrtis and duodenitis   ESOPHAGOGASTRODUODENOSCOPY (EGD) WITH PROPOFOL N/A 06/19/2018   Procedure: ESOPHAGOGASTRODUODENOSCOPY (EGD) WITH PROPOFOL;  Surgeon: West Bali, MD;  Location: [facility];  Service: Endoscopy;  Laterality: N/A;   HEMORRHOID BANDING N/A 01/05/2015   Procedure: HEMORRHOID BANDING;  Surgeon: West Bali, MD;  Location: [facility];  Service: Endoscopy;  Laterality: N/A;   LEFT AND RIGHT HEART CATHETERIZATION WITH CORONARY ANGIOGRAM N/A 12/03/2014   Procedure: LEFT AND RIGHT HEART CATHETERIZATION WITH CORONARY ANGIOGRAM;  Surgeon: Micheline Chapman, MD;  Location: [facility];  Service: Cardiovascular;  Laterality: N/A;   POLYPECTOMY  06/19/2018   Procedure: POLYPECTOMY;  Surgeon: West Bali, MD;  Location: [facility];  Service: Endoscopy;;  rectal    SAVORY DILATION  06/19/2018   Procedure: SAVORY DILATION;  Surgeon: West Bali, MD;  Location: [facility];  Service: Endoscopy;;   TONSILLECTOMY     Family History  Problem Relation Age of Onset   Heart attack Father    Hypertension Father    Diabetes Father    Colon cancer Neg Hx    Colon polyps Neg Hx    Social History   Socioeconomic History   Marital status: Married    Spouse name: Not on file   Number of children: Not on file   Years of education: Not on file   Highest education level: Not on file  Occupational History   Occupation:  Automotive transmission repair  Tobacco Use   Smoking status: Former    Packs/day: 0.25    Years: 3.00    Additional pack years: 0.00    Total pack years: 0.75    Types: Cigars, Cigarettes    Start date: 10/19/1969    Quit date: 10/22/1972    Years since quitting: 50.4   Smokeless tobacco: Former    Types: Snuff  Vaping Use   Vaping Use: Never used  Substance and Sexual Activity   Alcohol use: No    Alcohol/week: 0.0 standard drinks of alcohol   Drug use: No   Sexual activity: Yes  Other Topics Concern   Not on file  Social History Narrative   Married with 5 children   No regular exercise   Social Determinants of Health   Financial Resource Strain:  Low Risk  (03/24/2023)   Overall Financial Resource Strain (CARDIA)    Difficulty of Paying Living Expenses: Not hard at all  Food Insecurity: No Food Insecurity (03/24/2023)   Hunger Vital Sign    Worried About Running Out of Food in the Last Year: Never true    Ran Out of Food in the Last Year: Never true  Transportation Needs: No Transportation Needs (03/24/2023)   PRAPARE - Administrator, Civil Service (Medical): No    Lack of Transportation (Non-Medical): No  Physical Activity: Sufficiently Active (03/24/2023)   Exercise Vital Sign    Days of Exercise per Week: 5 days    Minutes of Exercise per Session: 30 min  Stress: No Stress Concern Present (03/24/2023)   [employer] of Occupational Health - Occupational Stress Questionnaire    Feeling of Stress : Not at all  Social Connections: Socially Integrated (03/24/2023)   Social Connection and Isolation Panel [NHANES]    Frequency of Communication with Friends and Family: More than three times a week    Frequency of Social Gatherings with Friends and Family: More than three times a week    Attends Religious Services: More than 4 times per year    Active Member of [employer] or Organizations: Yes    Attends Engineer, structural: More than 4 times per year     Marital Status: Married    Tobacco Counseling Counseling given: Not Answered   Clinical Intake:  Pre-visit preparation completed: Yes  Pain : No/denies pain  Diabetes: No  How often do you need to have someone help you when you read instructions, pamphlets, or other written materials from your doctor or pharmacy?: 1 - Never  Diabetic?No   Interpreter Needed?: No  Information entered by :: Kandis Fantasia LPN   Activities of Daily Living    03/24/2023    2:11 PM  In your present state of health, do you have any difficulty performing the following activities:  Hearing? 0  Vision? 0  Difficulty concentrating or making decisions? 0  Walking or climbing stairs? 0  Dressing or bathing? 0  Doing errands, shopping? 0  Preparing Food and eating ? N  Using the Toilet? N  In the past six months, have you accidently leaked urine? N  Do you have problems with loss of bowel control? N  Managing your Medications? N  Managing your Finances? N  Housekeeping or managing your Housekeeping? N    Patient Care Team: Babs Sciara, MD as PCP - General (Family Medicine) Jonelle Sidle, MD as Consulting Physician (Cardiology) West Bali, MD (Inactive) as Consulting Physician (Gastroenterology)  Indicate any recent Medical Services you may have received from other than [facility] providers in the past year (date may be approximate).     Assessment:   This is a routine wellness examination for Brason.  Hearing/Vision screen No results found.  Dietary issues and exercise activities discussed: Current Exercise Habits: The patient does not participate in regular exercise at present   Goals Addressed   None    Depression Screen    03/24/2023    2:08 PM 12/30/2022   11:41 AM 11/30/2022   11:31 AM 03/09/2022    3:00 PM 03/08/2022    2:23 PM 09/16/2021   11:04 AM 07/15/2021    1:22 PM  PHQ 2/9 Scores  PHQ - 2 Score 0 0 0 0 0 0 0  PHQ- 9 Score  0 0  Fall Risk     12/30/2022   11:40 AM 11/30/2022   11:30 AM 03/09/2022    3:00 PM 03/08/2022    2:28 PM 09/16/2021   11:04 AM  Fall Risk   Falls in the past year? 0 0 0 0 0  Number falls in past yr: 0 0  0 0  Injury with Fall? 0 0  0 0  Risk for fall due to :    No Fall Risks No Fall Risks  Follow up    Falls prevention discussed Falls evaluation completed    FALL RISK PREVENTION PERTAINING TO THE HOME:  Any stairs in or around the home? No  If so, are there any without handrails? No  Home free of loose throw rugs in walkways, pet beds, electrical cords, etc? Yes  Adequate lighting in your home to reduce risk of falls? Yes   ASSISTIVE DEVICES UTILIZED TO PREVENT FALLS:  Life alert? No  Use of a cane, walker or w/c? No  Grab bars in the bathroom? Yes  Shower chair or bench in shower? No  Elevated toilet seat or a handicapped toilet? Yes   TIMED UP AND GO:  Was the test performed? No . Telephonic visit   Cognitive Function:        03/24/2023    2:11 PM 03/08/2022    2:33 PM  6CIT Screen  What Year? 0 points 0 points  What month? 0 points 0 points  What time? 0 points 0 points  Count back from 20 0 points 0 points  Months in reverse 0 points 4 points  Repeat phrase 0 points 2 points  Total Score 0 points 6 points    Immunizations Immunization History  Administered Date(s) Administered   Influenza,inj,Quad PF,6+ Mos 11/03/2016, 09/03/2020   Moderna Sars-Covid-2 Vaccination 02/10/2020, 03/09/2020   Pneumococcal Conjugate-13 06/23/2016   Pneumococcal Polysaccharide-23 04/10/2015, 11/12/2020    TDAP status: Due, Education has been provided regarding the importance of this vaccine. Advised may receive this vaccine at local pharmacy or Health Dept. Aware to provide a copy of the vaccination record if obtained from local pharmacy or Health Dept. Verbalized acceptance and understanding.  Flu Vaccine status: Up to date  Pneumococcal vaccine status: Up to date  Covid-19 vaccine status:  Information provided on how to obtain vaccines.   Qualifies for Shingles Vaccine? Yes   Zostavax completed No   Shingrix Completed?: No.    Education has been provided regarding the importance of this vaccine. Patient has been advised to call insurance company to determine out of pocket expense if they have not yet received this vaccine. Advised may also receive vaccine at local pharmacy or Health Dept. Verbalized acceptance and understanding.  Screening Tests Health Maintenance  Topic Date Due   Hepatitis C Screening  Never done   DTaP/Tdap/Td (1 - Tdap) Never done   Zoster Vaccines- Shingrix (1 of 2) Never done   COVID-19 Vaccine (3 - Moderna risk series) 04/06/2020   COLONOSCOPY (Pts 45-83yrs Insurance coverage will need to be confirmed)  06/20/2023   INFLUENZA VACCINE  07/06/2023   Medicare Annual Wellness (AWV)  03/23/2024   Pneumonia Vaccine 81+ Years old  Completed   HPV VACCINES  Aged Out    Health Maintenance  Health Maintenance Due  Topic Date Due   Hepatitis C Screening  Never done   DTaP/Tdap/Td (1 - Tdap) Never done   Zoster Vaccines- Shingrix (1 of 2) Never done   COVID-19 Vaccine (3 - Moderna  risk series) 04/06/2020    Colorectal cancer screening: Type of screening: Colonoscopy. Completed 06/19/18. Repeat every 5 years  Lung Cancer Screening: (Low Dose CT Chest recommended if Age 87-80 years, 30 pack-year currently smoking OR have quit w/in 15years.) does not qualify.   Lung Cancer Screening Referral: n/a  Additional Screening:  Hepatitis C Screening: does qualify  Vision Screening: Recommended annual ophthalmology exams for early detection of glaucoma and other disorders of the eye. Is the patient up to date with their annual eye exam?  Yes  Who is the provider or what is the name of the office in which the patient attends annual eye exams? [facility] If pt is not established with a provider, would they like to be referred to a provider to establish  care? No .   Dental Screening: Recommended annual dental exams for proper oral hygiene  Community Resource Referral / Chronic Care Management: CRR required this visit?  No   CCM required this visit?  No      Plan:     I have personally reviewed and noted the following in the patient's chart:   Medical and social history Use of alcohol, tobacco or illicit drugs  Current medications and supplements including opioid prescriptions. Patient is not currently taking opioid prescriptions. Functional ability and status Nutritional status Physical activity Advanced directives List of other physicians Hospitalizations, surgeries, and ER visits in previous 12 months Vitals Screenings to include cognitive, depression, and falls Referrals and appointments  In addition, I have reviewed and discussed with patient certain preventive protocols, quality metrics, and best practice recommendations. A written personalized care plan for preventive services as well as general preventive health recommendations were provided to patient.     Durwin Nora, [place]   1/61/0960   Due to this being a virtual visit, the after visit summary with patients personalized plan was offered to patient via mail or my-chart.  Patient would like to access on my-chart  Nurse Notes: No concerns

## 2023-03-24 ENCOUNTER — Ambulatory Visit (INDEPENDENT_AMBULATORY_CARE_PROVIDER_SITE_OTHER): Payer: Medicare Other

## 2023-03-24 VITALS — Ht 73.0 in | Wt 214.0 lb

## 2023-03-24 DIAGNOSIS — Z0000 Encounter for general adult medical examination without abnormal findings: Secondary | ICD-10-CM | POA: Diagnosis not present

## 2023-03-24 DIAGNOSIS — Z1211 Encounter for screening for malignant neoplasm of colon: Secondary | ICD-10-CM

## 2023-03-28 ENCOUNTER — Encounter: Payer: Self-pay | Admitting: *Deleted

## 2023-03-30 ENCOUNTER — Ambulatory Visit (INDEPENDENT_AMBULATORY_CARE_PROVIDER_SITE_OTHER): Payer: Medicare Other | Admitting: Family Medicine

## 2023-03-30 DIAGNOSIS — Z7901 Long term (current) use of anticoagulants: Secondary | ICD-10-CM

## 2023-03-30 LAB — POCT INR: POC INR: 2.2

## 2023-04-02 NOTE — Progress Notes (Signed)
Nurses did a INR.  This was interacted with the patient.  He is to do a follow-up INR 1 month

## 2023-04-18 ENCOUNTER — Other Ambulatory Visit: Payer: Self-pay | Admitting: Family Medicine

## 2023-04-23 NOTE — H&P (View-Only) (Signed)
  GI Office Note    Referring Provider: Luking, Scott A, MD Primary Care Physician:  Luking, Scott A, MD  Primary Gastroenterologist: Charles K. Carver, DO  Chief Complaint   No chief complaint on file.  History of Present Illness   David Dougherty is a 72 y.o. male presenting today at the request of Luking, Scott A, MD for colonoscopy.   EGD 2016: -GE junction stricture -salmon colored esophageal mucosa -small hiatal hernia -mild erosive gastritis and duodenitis -gastric biopsies with chronic inflammation negative for H. 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 Pylori or intestinal metaplasia -repeat EGD in 3-5 years  Last colonoscopy 06/19/18: -normal TI -3 mm rectal polyp -small internal hemorrhoids -moderate external hemorrhoids -rectal polyp hyperplastic -advised to consider surgery for hemorrhoidectomy  Today:    Current Outpatient Medications  Medication Sig Dispense Refill   atorvastatin (LIPITOR) 40 MG tablet Take 1 tablet by mouth once daily 90 tablet 1   calcium carbonate (TUMS - DOSED IN MG ELEMENTAL CALCIUM) 500 MG chewable tablet Chew 2-3 tablets by mouth as needed for indigestion or heartburn.      carvedilol (COREG) 3.125 MG tablet Take 1 tablet by mouth twice daily 180 tablet 1   Cholecalciferol (VITAMIN D-3) 125 MCG (5000 UT) TABS Take 5,000 Units by mouth daily.     fluticasone (FLONASE) 50 MCG/ACT nasal spray Place 2 sprays into both nostrils daily. 16 g 5   furosemide (LASIX) 20 MG tablet Take 1 tablet (20 mg total) by mouth daily. 90 tablet 1   losartan (COZAAR) 25 MG tablet Take 1  tablet by mouth once daily 90 tablet 0   nitroGLYCERIN (NITROSTAT) 0.4 MG SL tablet PLACE 1 TAB UNDER TONGUE EVERY 5 MIN IF NEEDED FOR CHEST PAIN. MAY USE 3 TIMES.NO RELIEF CALL 911. Strength: 0.4 mg 25 tablet 1   omeprazole (PRILOSEC) 20 MG capsule Take 1 capsule by mouth once daily 90 capsule 0   oxymetazoline (AFRIN) 0.05 % nasal spray Place 1 spray into both nostrils 2 (two) times daily as needed for congestion.     pregabalin (LYRICA) 25 MG capsule 1 before supper and 1  at bedtime for foot nerve pain 60 capsule 4   tamsulosin (FLOMAX) 0.4 MG CAPS capsule Take 1 capsule (0.4 mg total) by mouth daily after supper. 90 capsule 3   topiramate (TOPAMAX) 50 MG tablet Take 1 tablet by mouth twice daily 180 tablet 1   warfarin (COUMADIN) 5 MG tablet TAKE 1 TO 2 TABLETS BY MOUTH ONCE DAILY BASED ON INR 180 tablet 1   No current facility-administered medications for this visit.    Past Medical History:  Diagnosis Date   Atrial fibrillation (HCC)    CAD (coronary artery disease)    70% distal circumflex 11/2014    Dysrhythmia    AFib   GERD (gastroesophageal reflux disease)    Nonischemic cardiomyopathy (HCC)    Tobacco abuse    Snuff    Past Surgical History:  Procedure Laterality Date   APPENDECTOMY     CATARACT EXTRACTION W/PHACO Left 11/18/2019   Procedure: CATARACT EXTRACTION PHACO AND INTRAOCULAR LENS PLACEMENT (IOC);  Surgeon: Wrzosek, James, MD;  Location: [facility];  Service: Ophthalmology;  Laterality:   Left;  CDE: 9.66   CATARACT EXTRACTION W/PHACO Right 12/04/2019   Procedure: CATARACT EXTRACTION PHACO AND INTRAOCULAR LENS PLACEMENT RIGHT EYE (CDE: 4.56);  Surgeon: Wrzosek, James, MD;  Location: [facility];  Service: Ophthalmology;  Laterality: Right;   COLONOSCOPY  2005   Neg   COLONOSCOPY N/A 01/05/2015   SLF:17 colon polyps removed/moderate sized hemorrhoids   COLONOSCOPY WITH PROPOFOL N/A 06/19/2018   Procedure: COLONOSCOPY WITH PROPOFOL;  Surgeon: Fields, Sandi L, MD;  [facility];  Service: Endoscopy;  Laterality: N/A;  7:30am   ESOPHAGOGASTRODUODENOSCOPY N/A 01/05/2015   SLF:stricture at gastro junction/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/small HH/mild erosive gastrtis and duodenitis   ESOPHAGOGASTRODUODENOSCOPY (EGD) WITH PROPOFOL N/A 06/19/2018   Procedure: ESOPHAGOGASTRODUODENOSCOPY (EGD) WITH PROPOFOL;  Surgeon: Fields, Sandi L, MD;  [facility]: [facility];  Service: Endoscopy;  Laterality: N/A;   HEMORRHOID BANDING N/A 01/05/2015   Procedure: HEMORRHOID BANDING;  Surgeon: Sandi L Fields, MD;  [facility];  Service: Endoscopy;  Laterality: N/A;   LEFT AND RIGHT HEART CATHETERIZATION WITH CORONARY ANGIOGRAM N/A 12/03/2014   Procedure: LEFT AND RIGHT HEART CATHETERIZATION WITH CORONARY ANGIOGRAM;  Surgeon: Michael D Cooper, MD;  Location: [facility];  Service: Cardiovascular;  Laterality: N/A;   POLYPECTOMY  06/19/2018   Procedure: POLYPECTOMY;  Surgeon: Fields, Sandi L, MD;  [facility];  Service: Endoscopy;;  rectal    SAVORY DILATION  06/19/2018   Procedure: SAVORY DILATION;  Surgeon: Fields, Sandi L, MD;  [facility];  Service: Endoscopy;;   TONSILLECTOMY      Family History  Problem Relation Age of Onset   Heart attack Father    Hypertension Father    Diabetes Father    Colon cancer Neg Hx    Colon polyps Neg Hx     Allergies as of 04/24/2023 - Review Complete 02/01/2023  Allergen Reaction Noted   Reglan [metoclopramide] Anaphylaxis 06/08/2018    Social History   Socioeconomic History   Marital status: Married    Spouse name: Not on file   Number of children: Not on file   Years of education: Not on file   Highest education level: Not on file  Occupational History   Occupation: Automotive transmission repair  Tobacco Use   Smoking status: Former    Packs/day: 0.25    Years: 3.00    Additional pack years: 0.00    Total pack years: 0.75    Types: Cigars, Cigarettes    Start date: 10/19/1969    Quit  date: 10/22/1972    Years since quitting: 50.5   Smokeless tobacco: Former    Types: Snuff  Vaping Use   Vaping Use: Never used  Substance and Sexual Activity   Alcohol use: No    Alcohol/week: 0.0 standard drinks of alcohol   Drug use: No   Sexual activity: Yes  Other Topics Concern   Not on file  Social History Narrative   Married with 5 children   No regular exercise   Social Determinants of Health   Financial Resource Strain: Low Risk  (03/24/2023)   Overall Financial Resource Strain (CARDIA)    Difficulty of Paying Living Expenses: Not hard at all  Food Insecurity: No Food Insecurity (03/24/2023)   Hunger Vital Sign    Worried About Running Out of Food in the Last Year: Never true    Ran Out of Food in the Last Year: Never true  Transportation Needs: No Transportation Needs (03/24/2023)   PRAPARE -   Transportation    Lack of Transportation (Medical): No    Lack of Transportation (Non-Medical): No  Physical Activity: Sufficiently Active (03/24/2023)   Exercise Vital Sign    Days of Exercise per Week: 5 days    Minutes of Exercise per Session: 30 min  Stress: No Stress Concern Present (03/24/2023)   [employer] - Occupational Stress Questionnaire    Feeling of Stress : Not at all  Social Connections: Socially Integrated (03/24/2023)   Social Connection and Isolation Panel [NHANES]    Frequency of Communication with Friends and Family: More than three times a week    Frequency of Social Gatherings with Friends and Family: More than three times a week    Attends Religious Services: More than 4 times per year    Active Member of Clubs or Organizations: Yes    Attends Club or Organization Meetings: More than 4 times per year    Marital Status: Married  Intimate Partner Violence: Not At Risk (03/24/2023)   Humiliation, Afraid, Rape, and Kick questionnaire    Fear of Current or Ex-Partner: No    Emotionally Abused: No    Physically Abused: No     Sexually Abused: No     Review of Systems   Gen: Denies any fever, chills, fatigue, weight loss, lack of appetite.  CV: Denies chest pain, heart palpitations, peripheral edema, syncope.  Resp: Denies shortness of breath at rest or with exertion. Denies wheezing or cough.  GI: see HPI GU : Denies urinary burning, urinary frequency, urinary hesitancy MS: Denies joint pain, muscle weakness, cramps, or limitation of movement.  Derm: Denies rash, itching, dry skin Psych: Denies depression, anxiety, memory loss, and confusion Heme: Denies bruising, bleeding, and enlarged lymph nodes.   Physical Exam   There were no vitals taken for this visit.  General:   Alert and oriented. Pleasant and cooperative. Well-nourished and well-developed.  Head:  Normocephalic and atraumatic. Eyes:  Without icterus, sclera clear and conjunctiva pink.  Ears:  Normal auditory acuity. Mouth:  No deformity or lesions, oral mucosa pink.  Lungs:  Clear to auscultation bilaterally. No wheezes, rales, or rhonchi. No distress.  Heart:  S1, S2 present without murmurs appreciated.  Abdomen:  +BS, soft, non-tender and non-distended. No HSM noted. No guarding or rebound. No masses appreciated.  Rectal:  Deferred  Msk:  Symmetrical without gross deformities. Normal posture. Extremities:  Without edema. Neurologic:  Alert and  oriented x4;  grossly normal neurologically. Skin:  Intact without significant lesions or rashes. Psych:  Alert and cooperative. Normal mood and affect.   Assessment   Takashi L Derryberry is a 71 y.o. male with a history of GERD, short segment barretts esophagus, CAD, NICM, Afib on coumadin presenting today for evaluation prior to scheduling colonoscopy.   History of colon polyps: Multiple prior colonoscopies in 2016 and 2019 with multiple prior tubular adenomas.   GERD, Barrett's esophagus: EGD in 2016 with gastritis and duodenitis and evidence of barrett's esophagus. EGD in 2019 with  gastritis and esophageal mucosa without pathology evidence of barretts. Maintained on once daily PPI.    PLAN   *** Proceed with upper endoscopy and colonoscopy with propofol by Dr. Carver in near future: the risks, benefits, and alternatives have been discussed with the patient in detail. The patient states understanding and desires to proceed. ASA 3 Continue omeprazole 20 mg once daily   Irelynd Zumstein, MSN, FNP-BC, AGACNP-BC [facility] 

## 2023-04-23 NOTE — Progress Notes (Signed)
GI Office Note    Referring Provider: Babs Sciara, MD Primary Care Physician:  Babs Sciara, MD  Primary Gastroenterologist: Hennie Duos. Marletta Lor, DO  Chief Complaint   No chief complaint on file.  History of Present Illness   Zyad L Mcright is a 72 y.o. male presenting today at the request of Luking, Jonna Coup, MD for colonoscopy.   EGD 2016: -GE junction stricture -salmon colored esophageal mucosa -small hiatal hernia -mild erosive gastritis and duodenitis -gastric biopsies with chronic inflammation negative for H. 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 Pylori or intestinal metaplasia -repeat EGD in 3-5 years  Last colonoscopy 06/19/18: -normal TI -3 mm rectal polyp -small internal hemorrhoids -moderate external hemorrhoids -rectal polyp hyperplastic -advised to consider surgery for hemorrhoidectomy  Today:    Current Outpatient Medications  Medication Sig Dispense Refill   atorvastatin (LIPITOR) 40 MG tablet Take 1 tablet by mouth once daily 90 tablet 1   calcium carbonate (TUMS - DOSED IN MG ELEMENTAL CALCIUM) 500 MG chewable tablet Chew 2-3 tablets by mouth as needed for indigestion or heartburn.      carvedilol (COREG) 3.125 MG tablet Take 1 tablet by mouth twice daily 180 tablet 1   Cholecalciferol (VITAMIN D-3) 125 MCG (5000 UT) TABS Take 5,000 Units by mouth daily.     fluticasone (FLONASE) 50 MCG/ACT nasal spray Place 2 sprays into both nostrils daily. 16 g 5   furosemide (LASIX) 20 MG tablet Take 1 tablet (20 mg total) by mouth daily. 90 tablet 1   losartan (COZAAR) 25 MG tablet Take 1  tablet by mouth once daily 90 tablet 0   nitroGLYCERIN (NITROSTAT) 0.4 MG SL tablet PLACE 1 TAB UNDER TONGUE EVERY 5 MIN IF NEEDED FOR CHEST PAIN. MAY USE 3 TIMES.NO RELIEF CALL 911. Strength: 0.4 mg 25 tablet 1   omeprazole (PRILOSEC) 20 MG capsule Take 1 capsule by mouth once daily 90 capsule 0   oxymetazoline (AFRIN) 0.05 % nasal spray Place 1 spray into both nostrils 2 (two) times daily as needed for congestion.     pregabalin (LYRICA) 25 MG capsule 1 before supper and 1  at bedtime for foot nerve pain 60 capsule 4   tamsulosin (FLOMAX) 0.4 MG CAPS capsule Take 1 capsule (0.4 mg total) by mouth daily after supper. 90 capsule 3   topiramate (TOPAMAX) 50 MG tablet Take 1 tablet by mouth twice daily 180 tablet 1   warfarin (COUMADIN) 5 MG tablet TAKE 1 TO 2 TABLETS BY MOUTH ONCE DAILY BASED ON INR 180 tablet 1   No current facility-administered medications for this visit.    Past Medical History:  Diagnosis Date   Atrial fibrillation (HCC)    CAD (coronary artery disease)    70% distal circumflex 11/2014    Dysrhythmia    AFib   GERD (gastroesophageal reflux disease)    Nonischemic cardiomyopathy (HCC)    Tobacco abuse    Snuff    Past Surgical History:  Procedure Laterality Date   APPENDECTOMY     CATARACT EXTRACTION W/PHACO Left 11/18/2019   Procedure: CATARACT EXTRACTION PHACO AND INTRAOCULAR LENS PLACEMENT (IOC);  Surgeon: Fabio Pierce, MD;  Location: [facility];  Service: Ophthalmology;  Laterality:  Left;  CDE: 9.66   CATARACT EXTRACTION W/PHACO Right 12/04/2019   Procedure: CATARACT EXTRACTION PHACO AND INTRAOCULAR LENS PLACEMENT RIGHT EYE (CDE: 4.56);  Surgeon: Fabio Pierce, MD;  Location: [facility];  Service: Ophthalmology;  Laterality: Right;   COLONOSCOPY  2005   Neg   COLONOSCOPY N/A 01/05/2015   SLF:17 colon polyps removed/moderate sized hemorrhoids   COLONOSCOPY WITH PROPOFOL N/A 06/19/2018   Procedure: COLONOSCOPY WITH PROPOFOL;  Surgeon: West Bali, MD;  [facility];  Service: Endoscopy;  Laterality: N/A;  7:30am   ESOPHAGOGASTRODUODENOSCOPY N/A 01/05/2015   [id number] at gastro junction/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/small HH/mild erosive gastrtis and duodenitis   ESOPHAGOGASTRODUODENOSCOPY (EGD) WITH PROPOFOL N/A 06/19/2018   Procedure: ESOPHAGOGASTRODUODENOSCOPY (EGD) WITH PROPOFOL;  Surgeon: West Bali, MD;  [facility];  Service: Endoscopy;  Laterality: N/A;   HEMORRHOID BANDING N/A 01/05/2015   Procedure: HEMORRHOID BANDING;  Surgeon: West Bali, MD;  [facility];  Service: Endoscopy;  Laterality: N/A;   LEFT AND RIGHT HEART CATHETERIZATION WITH CORONARY ANGIOGRAM N/A 12/03/2014   Procedure: LEFT AND RIGHT HEART CATHETERIZATION WITH CORONARY ANGIOGRAM;  Surgeon: Micheline Chapman, MD;  Location: [facility];  Service: Cardiovascular;  Laterality: N/A;   POLYPECTOMY  06/19/2018   Procedure: POLYPECTOMY;  Surgeon: West Bali, MD;  [facility];  Service: Endoscopy;;  rectal    SAVORY DILATION  06/19/2018   Procedure: SAVORY DILATION;  Surgeon: West Bali, MD;  [facility];  Service: Endoscopy;;   TONSILLECTOMY      Family History  Problem Relation Age of Onset   Heart attack Father    Hypertension Father    Diabetes Father    Colon cancer Neg Hx    Colon polyps Neg Hx     Allergies as of 04/24/2023 - Review Complete 02/01/2023  Allergen Reaction Noted   Reglan [metoclopramide] Anaphylaxis 06/08/2018    Social History   Socioeconomic History   Marital status: Married    Spouse name: Not on file   Number of children: Not on file   Years of education: Not on file   Highest education level: Not on file  Occupational History   Occupation: Automotive transmission repair  Tobacco Use   Smoking status: Former    Packs/day: 0.25    Years: 3.00    Additional pack years: 0.00    Total pack years: 0.75    Types: Cigars, Cigarettes    Start date: 10/19/1969    Quit  date: 10/22/1972    Years since quitting: 50.5   Smokeless tobacco: Former    Types: Snuff  Vaping Use   Vaping Use: Never used  Substance and Sexual Activity   Alcohol use: No    Alcohol/week: 0.0 standard drinks of alcohol   Drug use: No   Sexual activity: Yes  Other Topics Concern   Not on file  Social History Narrative   Married with 5 children   No regular exercise   Social Determinants of Health   Financial Resource Strain: Low Risk  (03/24/2023)   Overall Financial Resource Strain (CARDIA)    Difficulty of Paying Living Expenses: Not hard at all  Food Insecurity: No Food Insecurity (03/24/2023)   Hunger Vital Sign    Worried About Running Out of Food in the Last Year: Never true    Ran Out of Food in the Last Year: Never true  Transportation Needs: No Transportation Needs (03/24/2023)   PRAPARE -  Administrator, Civil Service (Medical): No    Lack of Transportation (Non-Medical): No  Physical Activity: Sufficiently Active (03/24/2023)   Exercise Vital Sign    Days of Exercise per Week: 5 days    Minutes of Exercise per Session: 30 min  Stress: No Stress Concern Present (03/24/2023)   [employer] of Occupational Health - Occupational Stress Questionnaire    Feeling of Stress : Not at all  Social Connections: Socially Integrated (03/24/2023)   Social Connection and Isolation Panel [NHANES]    Frequency of Communication with Friends and Family: More than three times a week    Frequency of Social Gatherings with Friends and Family: More than three times a week    Attends Religious Services: More than 4 times per year    Active Member of [employer] or Organizations: Yes    Attends Engineer, structural: More than 4 times per year    Marital Status: Married  Catering manager Violence: Not At Risk (03/24/2023)   Humiliation, Afraid, Rape, and Kick questionnaire    Fear of Current or Ex-Partner: No    Emotionally Abused: No    Physically Abused: No     Sexually Abused: No     Review of Systems   Gen: Denies any fever, chills, fatigue, weight loss, lack of appetite.  CV: Denies chest pain, heart palpitations, peripheral edema, syncope.  Resp: Denies shortness of breath at rest or with exertion. Denies wheezing or cough.  GI: see HPI GU : Denies urinary burning, urinary frequency, urinary hesitancy MS: Denies joint pain, muscle weakness, cramps, or limitation of movement.  Derm: Denies rash, itching, dry skin Psych: Denies depression, anxiety, memory loss, and confusion Heme: Denies bruising, bleeding, and enlarged lymph nodes.   Physical Exam   There were no vitals taken for this visit.  General:   Alert and oriented. Pleasant and cooperative. Well-nourished and well-developed.  Head:  Normocephalic and atraumatic. Eyes:  Without icterus, sclera clear and conjunctiva pink.  Ears:  Normal auditory acuity. Mouth:  No deformity or lesions, oral mucosa pink.  Lungs:  Clear to auscultation bilaterally. No wheezes, rales, or rhonchi. No distress.  Heart:  S1, S2 present without murmurs appreciated.  Abdomen:  +BS, soft, non-tender and non-distended. No HSM noted. No guarding or rebound. No masses appreciated.  Rectal:  Deferred  Msk:  Symmetrical without gross deformities. Normal posture. Extremities:  Without edema. Neurologic:  Alert and  oriented x4;  grossly normal neurologically. Skin:  Intact without significant lesions or rashes. Psych:  Alert and cooperative. Normal mood and affect.   Assessment   Gemayel L Latina is a 72 y.o. male with a history of GERD, short segment barretts esophagus, CAD, NICM, Afib on coumadin presenting today for evaluation prior to scheduling colonoscopy.   History of colon polyps: Multiple prior colonoscopies in 2016 and 2019 with multiple prior tubular adenomas.   GERD, Barrett's esophagus: EGD in 2016 with gastritis and duodenitis and evidence of barrett's esophagus. EGD in 2019 with  gastritis and esophageal mucosa without pathology evidence of barretts. Maintained on once daily PPI.    PLAN   *** Proceed with upper endoscopy and colonoscopy with propofol by Dr. Marletta Lor in near future: the risks, benefits, and alternatives have been discussed with the patient in detail. The patient states understanding and desires to proceed. ASA 3 Continue omeprazole 20 mg once daily   Brooke Bonito, MSN, FNP-BC, AGACNP-BC [facility]

## 2023-04-24 ENCOUNTER — Telehealth: Payer: Self-pay | Admitting: *Deleted

## 2023-04-24 ENCOUNTER — Ambulatory Visit: Payer: Medicare Other | Admitting: Gastroenterology

## 2023-04-24 ENCOUNTER — Other Ambulatory Visit: Payer: Self-pay | Admitting: Family Medicine

## 2023-04-24 ENCOUNTER — Encounter: Payer: Self-pay | Admitting: Gastroenterology

## 2023-04-24 VITALS — BP 114/81 | HR 79 | Temp 97.6°F | Ht 73.0 in | Wt 210.6 lb

## 2023-04-24 DIAGNOSIS — R194 Change in bowel habit: Secondary | ICD-10-CM

## 2023-04-24 DIAGNOSIS — R195 Other fecal abnormalities: Secondary | ICD-10-CM

## 2023-04-24 DIAGNOSIS — K2270 Barrett's esophagus without dysplasia: Secondary | ICD-10-CM

## 2023-04-24 DIAGNOSIS — Z86010 Personal history of colonic polyps: Secondary | ICD-10-CM | POA: Diagnosis not present

## 2023-04-24 DIAGNOSIS — K219 Gastro-esophageal reflux disease without esophagitis: Secondary | ICD-10-CM

## 2023-04-24 NOTE — Patient Instructions (Addendum)
Are scheduling for an upper endoscopy and colonoscopy in the near future with Dr. Marletta Lor.  We will request clearance to hold your Coumadin for 5 days or return of Lovenox from Dr. Gerda Diss.  Continue taking omeprazole 20 mg once daily.  If your reflux symptoms or abdominal pain becomes more frequent you to increase your dose to omeprazole 40 mg once daily.  Follow a GERD diet:  Avoid fried, fatty, greasy, spicy, citrus foods. Avoid caffeine and carbonated beverages. Avoid chocolate. Try eating 4-6 small meals a day rather than 3 large meals. Do not eat within 3 hours of laying down. Prop head of bed up on wood or bricks to create a 6 inch incline.  We will follow-up in 4 months, sooner if needed  It was a pleasure to see you today. I want to create trusting relationships with patients. If you receive a survey regarding your visit,  I greatly appreciate you taking time to fill this out on paper or through your MyChart. I value your feedback.  Brooke Bonito, MSN, FNP-BC, AGACNP-BC [facility]

## 2023-04-24 NOTE — Telephone Encounter (Signed)
  Request for patient to stop medication prior to procedure or is needing cleareance  04/24/23  David Dougherty [birth year]  What type of surgery is being performed? Colonoscopy/Esophagogastroduodenoscopy (EGD)  When is surgery scheduled? TBD  What type of clearance is required (medical or pharmacy to hold medication or both? medication  Are there any medications that need to be held prior to surgery and how long? Coumadin x 5 days or bridge with Lovenox  Name of physician performing surgery?  Dr.Carvcer [facility] [employer]: [phone] Fax: [phone]  Anethesia type (none, local, MAC, general)? MAC

## 2023-04-25 ENCOUNTER — Other Ambulatory Visit: Payer: Self-pay | Admitting: Family Medicine

## 2023-04-25 NOTE — Telephone Encounter (Signed)
Nurses-please let patient know that we were contacted by gastroenterology regarding him coming off of Coumadin to have his colonoscopy/EGD.  Said typically in the situations being is recommended, with medication 6 days before the procedure then to go back onto Coumadin starting the following day with rechecking INR 10 days to 14 days later.  Theoretically there is a slight increased risk of stroke when off of Coumadin but it is necessary to come off of Coumadin in order to have these tests.  Patient will need to give his verbal consent once he gives you that then we can send additional consent to gastroenterology to proceed forward with what they would like to do.  If any questions please let me know.  In his situation we do not recommend bridging with Lovenox

## 2023-04-26 NOTE — Telephone Encounter (Signed)
Hi David Dougherty I did communicate with the patient regarding the need to come off of Coumadin in order to have his test.  It would be fine to come off the Coumadin 6 days prior to the event.  He may restart the Coumadin depending on what Dr. Marletta Lor states.  Potentially it would be that evening or the following day.  Then he should follow-up with [place] in approximately 10 to 12 days to recheck PT/INR.  He is aware that there is a theoretical risk of a stroke while he is off of this medicine but I would not recommend bridging Patient is aware and gives verbal consent Gastroenterology may coordinate based around the above If any questions please notify us

## 2023-04-27 ENCOUNTER — Encounter: Payer: Self-pay | Admitting: *Deleted

## 2023-04-27 ENCOUNTER — Ambulatory Visit (INDEPENDENT_AMBULATORY_CARE_PROVIDER_SITE_OTHER): Payer: Medicare Other

## 2023-04-27 ENCOUNTER — Other Ambulatory Visit: Payer: Self-pay | Admitting: *Deleted

## 2023-04-27 DIAGNOSIS — Z7901 Long term (current) use of anticoagulants: Secondary | ICD-10-CM

## 2023-04-27 LAB — POCT INR: INR: 2.2 (ref 2.0–3.0)

## 2023-04-27 MED ORDER — PEG 3350-KCL-NA BICARB-NACL 420 G PO SOLR: 4000.0000 mL | Freq: Once | ORAL | 0 refills | Status: AC

## 2023-04-27 NOTE — Telephone Encounter (Signed)
Thanks Scott!! 

## 2023-04-27 NOTE — Progress Notes (Signed)
Patient comes in today for inr check 2.2 to return in 4 weeks, no changes needed per provider report.

## 2023-04-27 NOTE — Telephone Encounter (Signed)
Please advise. Thank you

## 2023-04-27 NOTE — Telephone Encounter (Signed)
Pt has been scheduled for 05/22/23. Instructions mailed and prep sent to the pharmacy 

## 2023-04-28 ENCOUNTER — Encounter: Payer: Self-pay | Admitting: *Deleted

## 2023-05-08 ENCOUNTER — Other Ambulatory Visit: Payer: Self-pay | Admitting: Family Medicine

## 2023-05-08 ENCOUNTER — Telehealth: Payer: Self-pay

## 2023-05-08 MED ORDER — FUROSEMIDE 20 MG PO TABS: 20.0000 mg | ORAL_TABLET | Freq: Every day | ORAL | 1 refills | Status: DC

## 2023-05-08 NOTE — Telephone Encounter (Signed)
Patient wanted you to know that his coloscopy is 05/22/23 and he needs to stop his comandum six days before he has procedure done. Having issues getting his medication  furosemide (LASIX) 20 MG tablet has been without it 3 days now.[facility]   please advise    

## 2023-05-08 NOTE — Telephone Encounter (Signed)
Patient wanted you to know that his coloscopy is 05/22/23 and he needs to stop his comandum six days before he has procedure done. Having issues getting his medication  furosemide (LASIX) 20 MG tablet has been without it 3 days now.[facility] [place]   please advise

## 2023-05-08 NOTE — Telephone Encounter (Signed)
Typically with colonoscopy we recommend stopping Coumadin  5 days prior to the procedure So if the procedure was on a Friday stop taking the Coumadin on Sunday Afterwards if everything went well and the gastroenterologist feels that it is okay to restart Coumadin they want you to restart it that night or the following day  Then it would be important to recheck the INR in approximately 5 days - 7 days  As for the furosemide I sent in refills last week, just in case it did not go through I agree sent it again today

## 2023-05-09 NOTE — Telephone Encounter (Signed)
Patient advised per Dr Lorin Picket: Typically with colonoscopy we recommend stopping Coumadin  5 days prior to the procedure So if the procedure was on a Friday stop taking the Coumadin on Sunday Afterwards if everything went well and the gastroenterologist feels that it is okay to restart Coumadin they want you to restart it that night or the following day   Then it would be important to recheck the INR in approximately 5 days - 7 days   As for the furosemide I sent in refills last week, just in case it did not go through  Patient verbalized understanding and stated he has gotten the fluid pills

## 2023-05-17 NOTE — Patient Instructions (Signed)
David Dougherty  05/17/2023     @PREFPERIOPPHARMACY @   Your procedure is scheduled on  05/22/2023.   Report to [facility] at  1030  A.M.   Call this number if you have problems the morning of surgery:  [phone]  If you experience any cold or flu symptoms such as cough, fever, chills, shortness of breath, etc. between now and your scheduled surgery, please notify us at the above number.   Remember:  Follow the diet and prep instructions given to you by the office.     Your last dose of coumadin should be on 05/14/2023.     Take these medicines the morning of surgery with A SIP OF WATER                        carvedilol, omeprazole, topamax.     Do not wear jewelry, make-up or nail polish, including gel polish,  artificial nails, or any other type of covering on natural nails (fingers and  toes).  Do not wear lotions, powders, or perfumes, or deodorant.  Do not shave 48 hours prior to surgery.  Men may shave face and neck.  Do not bring valuables to the hospital.  [facility] is not responsible for any belongings or valuables.  Contacts, dentures or bridgework may not be worn into surgery.  Leave your suitcase in the car.  After surgery it may be brought to your room.  For patients admitted to the hospital, discharge time will be determined by your treatment team.  Patients discharged the day of surgery will not be allowed to drive home and must have someone with them for 24 hours.    Special instructions:   DO NOT smoke tobacco or vape for 24 hours before your procedure.  Please read over the following fact sheets that you were given. Anesthesia Post-op Instructions and Care and Recovery After Surgery      Upper Endoscopy, Adult, Care After After the procedure, it is common to have a sore throat. It is also common to have: Mild stomach pain or discomfort. Bloating. Nausea. Follow these instructions at home: The instructions below may help you care  for yourself at home. Your health care provider may give you more instructions. If you have questions, ask your health care provider. If you were given a sedative during the procedure, it can affect you for several hours. Do not drive or operate machinery until your health care provider says that it is safe. If you will be going home right after the procedure, plan to have a responsible adult: Take you home from the hospital or clinic. You will not be allowed to drive. Care for you for the time you are told. Follow instructions from your health care provider about what you may eat and drink. Return to your normal activities as told by your health care provider. Ask your health care provider what activities are safe for you. Take over-the-counter and prescription medicines only as told by your health care provider. Contact a health care provider if you: Have a sore throat that lasts longer than one day. Have trouble swallowing. Have a fever. Get help right away if you: Vomit blood or your vomit looks like coffee grounds. Have bloody, black, or tarry stools. Have a very bad sore throat or you cannot swallow. Have difficulty breathing or very bad pain in your chest or abdomen. These symptoms may be an emergency. Get  help right away. Call 911. Do not wait to see if the symptoms will go away. Do not drive yourself to the hospital. Summary After the procedure, it is common to have a sore throat, mild stomach discomfort, bloating, and nausea. If you were given a sedative during the procedure, it can affect you for several hours. Do not drive until your health care provider says that it is safe. Follow instructions from your health care provider about what you may eat and drink. Return to your normal activities as told by your health care provider. This information is not intended to replace advice given to you by your health care provider. Make sure you discuss any questions you have with your  health care provider. Document Revised: 03/02/2022 Document Reviewed: 03/02/2022 Elsevier Patient Education  2024 Else[employer] Inc. Colonoscopy, Adult, Care After The following information offers guidance on how to care for yourself after your procedure. Your health care provider may also give you more specific instructions. If you have problems or questions, contact your health care provider. What can I expect after the procedure? After the procedure, it is common to have: A small amount of blood in your stool for 24 hours after the procedure. Some gas. Mild cramping or bloating of your abdomen. Follow these instructions at home: Eating and drinking  Drink enough fluid to keep your urine pale yellow. Follow instructions from your health care provider about eating or drinking restrictions. Resume your normal diet as told by your health care provider. Avoid heavy or fried foods that are hard to digest. Activity Rest as told by your health care provider. Avoid sitting for a long time without moving. Get up to take short walks every 1-2 hours. This is important to improve blood flow and breathing. Ask for help if you feel weak or unsteady. Return to your normal activities as told by your health care provider. Ask your health care provider what activities are safe for you. Managing cramping and bloating  Try walking around when you have cramps or feel bloated. If directed, apply heat to your abdomen as told by your health care provider. Use the heat source that your health care provider recommends, such as a moist heat pack or a heating pad. Place a towel between your skin and the heat source. Leave the heat on for 20-30 minutes. Remove the heat if your skin turns bright red. This is especially important if you are unable to feel pain, heat, or cold. You have a greater risk of getting burned. General instructions If you were given a sedative during the procedure, it can affect you for several  hours. Do not drive or operate machinery until your health care provider says that it is safe. For the first 24 hours after the procedure: Do not sign important documents. Do not drink alcohol. Do your regular daily activities at a slower pace than normal. Eat soft foods that are easy to digest. Take over-the-counter and prescription medicines only as told by your health care provider. Keep all follow-up visits. This is important. Contact a health care provider if: You have blood in your stool 2-3 days after the procedure. Get help right away if: You have more than a small spotting of blood in your stool. You have large blood clots in your stool. You have swelling of your abdomen. You have nausea or vomiting. You have a fever. You have increasing pain in your abdomen that is not relieved with medicine. These symptoms may be an emergency. Get  help right away. Call 911. Do not wait to see if the symptoms will go away. Do not drive yourself to the hospital. Summary After the procedure, it is common to have a small amount of blood in your stool. You may also have mild cramping and bloating of your abdomen. If you were given a sedative during the procedure, it can affect you for several hours. Do not drive or operate machinery until your health care provider says that it is safe. Get help right away if you have a lot of blood in your stool, nausea or vomiting, a fever, or increased pain in your abdomen. This information is not intended to replace advice given to you by your health care provider. Make sure you discuss any questions you have with your health care provider. Document Revised: 01/03/2023 Document Reviewed: 07/14/2021 Elsevier Patient Education  2024 Else[employer] Inc. Monitored Anesthesia Care, Care After The following information offers guidance on how to care for yourself after your procedure. Your health care provider may also give you more specific instructions. If you have  problems or questions, contact your health care provider. What can I expect after the procedure? After the procedure, it is common to have: Tiredness. Little or no memory about what happened during or after the procedure. Impaired judgment when it comes to making decisions. Nausea or vomiting. Some trouble with balance. Follow these instructions at home: For the time period you were told by your health care provider:  Rest. Do not participate in activities where you could fall or become injured. Do not drive or use machinery. Do not drink alcohol. Do not take sleeping pills or medicines that cause drowsiness. Do not make important decisions or sign legal documents. Do not take care of children on your own. Medicines Take over-the-counter and prescription medicines only as told by your health care provider. If you were prescribed antibiotics, take them as told by your health care provider. Do not stop using the antibiotic even if you start to feel better. Eating and drinking Follow instructions from your health care provider about what you may eat and drink. Drink enough fluid to keep your urine pale yellow. If you vomit: Drink clear fluids slowly and in small amounts as you are able. Clear fluids include water, ice chips, low-calorie sports drinks, and fruit juice that has water added to it (diluted fruit juice). Eat light and bland foods in small amounts as you are able. These foods include bananas, applesauce, rice, lean meats, toast, and crackers. General instructions  Have a responsible adult stay with you for the time you are told. It is important to have someone help care for you until you are awake and alert. If you have sleep apnea, surgery and some medicines can increase your risk for breathing problems. Follow instructions from your health care provider about wearing your sleep device: When you are sleeping. This includes during daytime naps. While taking prescription pain  medicines, sleeping medicines, or medicines that make you drowsy. Do not use any products that contain nicotine or tobacco. These products include cigarettes, chewing tobacco, and vaping devices, such as e-cigarettes. If you need help quitting, ask your health care provider. Contact a health care provider if: You feel nauseous or vomit every time you eat or drink. You feel light-headed. You are still sleepy or having trouble with balance after 24 hours. You get a rash. You have a fever. You have redness or swelling around the IV site. Get help right away if: You  have trouble breathing. You have new confusion after you get home. These symptoms may be an emergency. Get help right away. Call 911. Do not wait to see if the symptoms will go away. Do not drive yourself to the hospital. This information is not intended to replace advice given to you by your health care provider. Make sure you discuss any questions you have with your health care provider. Document Revised: 04/18/2022 Document Reviewed: 04/18/2022 Elsevier Patient Education  2024 [employer].

## 2023-05-18 ENCOUNTER — Encounter (HOSPITAL_COMMUNITY)
Admission: RE | Admit: 2023-05-18 | Discharge: 2023-05-18 | Disposition: A | Discharge status: Home / Self Care | Payer: Medicare Other | Source: Ambulatory Visit | Attending: Internal Medicine | Admitting: Internal Medicine

## 2023-05-18 ENCOUNTER — Encounter (HOSPITAL_COMMUNITY): Payer: Self-pay

## 2023-05-18 VITALS — BP 142/93 | HR 45 | Temp 98.4°F | Resp 18 | Ht 73.0 in | Wt 210.5 lb

## 2023-05-18 DIAGNOSIS — I480 Paroxysmal atrial fibrillation: Secondary | ICD-10-CM | POA: Insufficient documentation

## 2023-05-18 DIAGNOSIS — Z01818 Encounter for other preprocedural examination: Secondary | ICD-10-CM | POA: Insufficient documentation

## 2023-05-18 DIAGNOSIS — Z79899 Other long term (current) drug therapy: Secondary | ICD-10-CM | POA: Diagnosis not present

## 2023-05-18 DIAGNOSIS — I10 Essential (primary) hypertension: Secondary | ICD-10-CM | POA: Diagnosis not present

## 2023-05-18 HISTORY — DX: Unspecified osteoarthritis, unspecified site: M19.90

## 2023-05-18 LAB — BASIC METABOLIC PANEL
Anion gap: 7 (ref 5–15)
BUN: 18 mg/dL (ref 8–23)
CO2: 23 mmol/L (ref 22–32)
Calcium: 9 mg/dL (ref 8.9–10.3)
Chloride: 108 mmol/L (ref 98–111)
Creatinine, Ser: 1.01 mg/dL (ref 0.61–1.24)
GFR, Estimated: >60 mL/min (ref >60)
Glucose, Bld: 122 mg/dL — ABNORMAL HIGH (ref 70–99)
Potassium: 4.3 mmol/L (ref 3.5–5.1)
Sodium: 138 mmol/L (ref 135–145)

## 2023-05-22 ENCOUNTER — Ambulatory Visit (HOSPITAL_COMMUNITY): Payer: Medicare Other | Admitting: Anesthesiology

## 2023-05-22 ENCOUNTER — Ambulatory Visit (HOSPITAL_COMMUNITY)
Admission: RE | Admit: 2023-05-22 | Discharge: 2023-05-22 | Disposition: A | Discharge status: Home / Self Care | Payer: Medicare Other | Attending: Internal Medicine | Admitting: Internal Medicine

## 2023-05-22 ENCOUNTER — Other Ambulatory Visit: Payer: Self-pay

## 2023-05-22 ENCOUNTER — Encounter (HOSPITAL_COMMUNITY): Payer: Self-pay

## 2023-05-22 ENCOUNTER — Encounter (HOSPITAL_COMMUNITY)
Admission: RE | Disposition: A | Discharge status: Home / Self Care | Payer: Self-pay | Source: Home / Self Care | Attending: Internal Medicine

## 2023-05-22 ENCOUNTER — Ambulatory Visit (HOSPITAL_BASED_OUTPATIENT_CLINIC_OR_DEPARTMENT_OTHER): Payer: Medicare Other | Admitting: Anesthesiology

## 2023-05-22 DIAGNOSIS — K2970 Gastritis, unspecified, without bleeding: Secondary | ICD-10-CM | POA: Insufficient documentation

## 2023-05-22 DIAGNOSIS — K2270 Barrett's esophagus without dysplasia: Secondary | ICD-10-CM

## 2023-05-22 DIAGNOSIS — Z86010 Personal history of colonic polyps: Secondary | ICD-10-CM | POA: Diagnosis not present

## 2023-05-22 DIAGNOSIS — Z87891 Personal history of nicotine dependence: Secondary | ICD-10-CM | POA: Diagnosis not present

## 2023-05-22 DIAGNOSIS — D123 Benign neoplasm of transverse colon: Secondary | ICD-10-CM | POA: Insufficient documentation

## 2023-05-22 DIAGNOSIS — I4891 Unspecified atrial fibrillation: Secondary | ICD-10-CM

## 2023-05-22 DIAGNOSIS — Z1211 Encounter for screening for malignant neoplasm of colon: Secondary | ICD-10-CM | POA: Insufficient documentation

## 2023-05-22 DIAGNOSIS — D122 Benign neoplasm of ascending colon: Secondary | ICD-10-CM | POA: Insufficient documentation

## 2023-05-22 DIAGNOSIS — D120 Benign neoplasm of cecum: Secondary | ICD-10-CM | POA: Diagnosis not present

## 2023-05-22 DIAGNOSIS — K449 Diaphragmatic hernia without obstruction or gangrene: Secondary | ICD-10-CM | POA: Insufficient documentation

## 2023-05-22 DIAGNOSIS — D126 Benign neoplasm of colon, unspecified: Secondary | ICD-10-CM

## 2023-05-22 DIAGNOSIS — K219 Gastro-esophageal reflux disease without esophagitis: Secondary | ICD-10-CM | POA: Diagnosis not present

## 2023-05-22 DIAGNOSIS — K644 Residual hemorrhoidal skin tags: Secondary | ICD-10-CM | POA: Insufficient documentation

## 2023-05-22 DIAGNOSIS — K648 Other hemorrhoids: Secondary | ICD-10-CM | POA: Insufficient documentation

## 2023-05-22 DIAGNOSIS — R194 Change in bowel habit: Secondary | ICD-10-CM

## 2023-05-22 HISTORY — PX: BIOPSY: SHX5522

## 2023-05-22 HISTORY — PX: POLYPECTOMY: SHX5525

## 2023-05-22 HISTORY — PX: ESOPHAGOGASTRODUODENOSCOPY (EGD) WITH PROPOFOL: SHX5813

## 2023-05-22 HISTORY — PX: COLONOSCOPY WITH PROPOFOL: SHX5780

## 2023-05-22 SURGERY — COLONOSCOPY WITH PROPOFOL
Anesthesia: General

## 2023-05-22 MED ORDER — LACTATED RINGERS IV SOLN: INTRAVENOUS | Status: DC

## 2023-05-22 MED ORDER — PHENYLEPHRINE 80 MCG/ML (10ML) SYRINGE FOR IV PUSH (FOR BLOOD PRESSURE SUPPORT)
PREFILLED_SYRINGE | INTRAVENOUS | Status: DC | PRN
  Administered 2023-05-22 (×3): 160 ug via INTRAVENOUS

## 2023-05-22 MED ORDER — LIDOCAINE HCL (CARDIAC) PF 100 MG/5ML IV SOSY
PREFILLED_SYRINGE | INTRAVENOUS | Status: DC | PRN
  Administered 2023-05-22: 50 mg via INTRAVENOUS

## 2023-05-22 MED ORDER — PROPOFOL 500 MG/50ML IV EMUL
INTRAVENOUS | Status: DC | PRN
  Administered 2023-05-22: 150 ug/kg/min via INTRAVENOUS

## 2023-05-22 MED ORDER — PROPOFOL 10 MG/ML IV BOLUS
INTRAVENOUS | Status: DC | PRN
  Administered 2023-05-22: 40 mg via INTRAVENOUS
  Administered 2023-05-22: 100 mg via INTRAVENOUS
  Administered 2023-05-22: 40 mg via INTRAVENOUS
  Administered 2023-05-22: 50 mg via INTRAVENOUS
  Administered 2023-05-22: 40 mg via INTRAVENOUS

## 2023-05-22 NOTE — Op Note (Signed)
[facility] Patient Name: David Dougherty Procedure Date: 05/22/2023 12:15 PM MRN: [id number] Date of Birth: [birth year] Attending MD: Hennie Duos. Marletta Lor , [place], [id number] CSN: [id number] Age: 72 Admit Type: Outpatient Procedure:                Upper GI endoscopy Indications:              Follow-up of Barrett's esophagus Providers:                Hennie Duos. Marletta Lor, DO, Sheran Fava, Lennice Sites Technician, Technician Referring MD:              Medicines:                See the Anesthesia note for documentation of the                            administered medications Complications:            No immediate complications. Estimated Blood Loss:     Estimated blood loss was minimal. Procedure:                Pre-Anesthesia Assessment:                           - The anesthesia plan was to use monitored                            anesthesia care (MAC).                           After obtaining informed consent, the endoscope was                            passed under direct vision. Throughout the                            procedure, the patient's blood pressure, pulse, and                            oxygen saturations were monitored continuously. The                            [id number]-H190 ([id number]) scope was introduced through the                            mouth, and advanced to the second part of duodenum.                            The upper GI endoscopy was accomplished without                            difficulty. The patient tolerated the procedure                            well. Scope In:  12:29:17 PM Scope Out: 12:33:16 PM Total Procedure Duration: 0 hours 3 minutes 59 seconds  Findings:      A small hiatal hernia was present.      There were esophageal mucosal changes secondary to established       short-segment Barrett's disease present at the gastroesophageal junction       (2 tongues). The maximum longitudinal extent of these mucosal  changes       was 1 cm in length. Mucosa was biopsied with a cold forceps for       histology. One specimen bottle was sent to pathology.      Patchy mild inflammation characterized by erythema was found in the       gastric body and in the gastric antrum. Biopsies were taken with a cold       forceps for Helicobacter pylori testing.      The duodenal bulb, first portion of the duodenum and second portion of       the duodenum were normal. Impression:               - Small hiatal hernia.                           - Esophageal mucosal changes secondary to                            established short-segment Barrett's disease.                            Biopsied.                           - Gastritis. Biopsied.                           - Normal duodenal bulb, first portion of the                            duodenum and second portion of the duodenum. Moderate Sedation:      Per Anesthesia Care Recommendation:           - Patient has a contact number available for                            emergencies. The signs and symptoms of potential                            delayed complications were discussed with the                            patient. Return to normal activities tomorrow.                            Written discharge instructions were provided to the                            patient.                           -  Resume previous diet.                           - Continue present medications.                           - Await pathology results.                           - Repeat upper endoscopy in 5 years for                            surveillance.                           - Return to GI clinic in 3 months.                           - Use a proton pump inhibitor PO daily. Procedure Code(s):        --- Professional ---                           [phone], Esophagogastroduodenoscopy, flexible,                            transoral; with biopsy, single or multiple Diagnosis Code(s):         --- Professional ---                           K44.9, Diaphragmatic hernia without obstruction or                            gangrene                           K22.70, Barrett's esophagus without dysplasia                           K29.70, Gastritis, unspecified, without bleeding CPT copyright 2022 American Medical Association. All rights reserved. The codes documented in this report are preliminary and upon coder review may  be revised to meet current compliance requirements. Hennie Duos. Marletta Lor, DO Hennie Duos. Marletta Lor, DO 05/22/2023 12:37:09 PM This report has been signed electronically. Number of Addenda: 0

## 2023-05-22 NOTE — Anesthesia Procedure Notes (Signed)
Date/Time: 05/22/2023 12:27 PM  Performed by: Julian Reil, CRNAPre-anesthesia Checklist: Patient identified, Emergency Drugs available, Suction available and Patient being monitored Patient Re-evaluated:Patient Re-evaluated prior to induction Oxygen Delivery Method: Nasal cannula Induction Type: IV induction Placement Confirmation: positive ETCO2

## 2023-05-22 NOTE — Op Note (Signed)
[facility] Patient Name: David Dougherty Procedure Date: 05/22/2023 12:14 PM MRN: [id number] Date of Birth: Jun 12, 1951 Attending MD: Hennie Duos. Marletta Lor , [place], [id number] CSN: [id number] Age: 72 Admit Type: Outpatient Procedure:                Colonoscopy Indications:              Surveillance: Personal history of adenomatous                            polyps on last colonoscopy 5 years ago Providers:                Hennie Duos. Marletta Lor, DO, Sheran Fava, Lennice Sites Technician, Technician Referring MD:              Medicines:                See the Anesthesia note for documentation of the                            administered medications Complications:            No immediate complications. Estimated Blood Loss:     Estimated blood loss was minimal. Procedure:                Pre-Anesthesia Assessment:                           - The anesthesia plan was to use monitored                            anesthesia care (MAC).                           After obtaining informed consent, the colonoscope                            was passed under direct vision. Throughout the                            procedure, the patient's blood pressure, pulse, and                            oxygen saturations were monitored continuously. The                            [id number]-HQ190L ([id number]) scope was introduced through                            the anus and advanced to the the cecum, identified                            by appendiceal orifice and ileocecal valve. The                            colonoscopy was performed without  difficulty. The                            patient tolerated the procedure well. The quality                            of the bowel preparation was evaluated using the                            BBPS [facility] Bowel Preparation Scale) with scores                            of: Right Colon = 2 (minor amount of residual                             staining, small fragments of stool and/or opaque                            liquid, but mucosa seen well), Transverse Colon = 3                            (entire mucosa seen well with no residual staining,                            small fragments of stool or opaque liquid) and Left                            Colon = 3 (entire mucosa seen well with no residual                            staining, small fragments of stool or opaque                            liquid). The total BBPS score equals 8. The quality                            of the bowel preparation was good. Scope In: 12:37:47 PM Scope Out: 12:55:46 PM Scope Withdrawal Time: 0 hours 16 minutes 10 seconds  Total Procedure Duration: 0 hours 17 minutes 59 seconds  Findings:      Four sessile polyps were found in the ascending colon and cecum. The       polyps were 3 to 7 mm in size. These polyps were removed with a cold       snare. Resection and retrieval were complete.      Four sessile polyps were found in the transverse colon. The polyps were       4 to 6 mm in size. These polyps were removed with a cold snare.       Resection and retrieval were complete.      The exam was otherwise without abnormality. Impression:               - Four 3 to 7 mm polyps in the ascending colon and  in the cecum, removed with a cold snare. Resected                            and retrieved.                           - Four 4 to 6 mm polyps in the transverse colon,                            removed with a cold snare. Resected and retrieved.                           - The examination was otherwise normal. Moderate Sedation:      Per Anesthesia Care Recommendation:           - Patient has a contact number available for                            emergencies. The signs and symptoms of potential                            delayed complications were discussed with the                            patient. Return to normal  activities tomorrow.                            Written discharge instructions were provided to the                            patient.                           - Resume previous diet.                           - Continue present medications.                           - Await pathology results.                           - Repeat colonoscopy in 3 years for surveillance.                           - Return to GI clinic in 3 months. Procedure Code(s):        --- Professional ---                           [phone], Colonoscopy, flexible; with removal of                            tumor(s), polyp(s), or other lesion(s) by snare                            technique Diagnosis Code(s):        ---  Professional ---                           Z86.010, Personal history of colonic polyps                           D12.2, Benign neoplasm of ascending colon                           D12.0, Benign neoplasm of cecum                           D12.3, Benign neoplasm of transverse colon (hepatic                            flexure or splenic flexure) CPT copyright 2022 American Medical Association. All rights reserved. The codes documented in this report are preliminary and upon coder review may  be revised to meet current compliance requirements. Hennie Duos. Marletta Lor, DO Hennie Duos. Marletta Lor, DO 05/22/2023 1:00:02 PM This report has been signed electronically. Number of Addenda: 0

## 2023-05-22 NOTE — Anesthesia Postprocedure Evaluation (Signed)
Anesthesia Post Note  Patient: David Dougherty  Procedure(s) Performed: COLONOSCOPY WITH PROPOFOL ESOPHAGOGASTRODUODENOSCOPY (EGD) WITH PROPOFOL BIOPSY POLYPECTOMY  Patient location during evaluation: Phase II Anesthesia Type: General Level of consciousness: awake and alert and oriented Pain management: pain level controlled Vital Signs Assessment: post-procedure vital signs reviewed and stable Respiratory status: spontaneous breathing, nonlabored ventilation and respiratory function stable Cardiovascular status: blood pressure returned to baseline and stable Postop Assessment: no apparent nausea or vomiting Anesthetic complications: no  No notable events documented.   Last Vitals:  Vitals:   05/22/23 1053 05/22/23 1300  BP: (!) 141/99 (!) 102/57  Pulse: 84 89  Resp:  16  Temp: 36.7 C 36.6 C  SpO2: 99% 98%    Last Pain:  Vitals:   05/22/23 1300  TempSrc: Oral  PainSc: 0-No pain                 Treyana Sturgell C Martel Galvan

## 2023-05-22 NOTE — Anesthesia Preprocedure Evaluation (Signed)
Anesthesia Evaluation  Patient identified by MRN, date of birth, ID band Patient awake    Reviewed: Allergy & Precautions, H&P , NPO status , Patient's Chart, lab work & pertinent test results  Airway Mallampati: I  TM Distance: >3 FB Neck ROM: Full    Dental  (+) Dental Advisory Given, Edentulous Upper, Missing   Pulmonary former smoker   Pulmonary exam normal breath sounds clear to auscultation       Cardiovascular Exercise Tolerance: Good hypertension, Pt. on medications + angina  + CAD  + dysrhythmias Atrial Fibrillation  Rhythm:Irregular Rate:Normal     Neuro/Psych negative neurological ROS  negative psych ROS   GI/Hepatic Neg liver ROS,GERD  Medicated and Controlled,,  Endo/Other  negative endocrine ROS    Renal/GU negative Renal ROS  negative genitourinary   Musculoskeletal  (+) Arthritis , Osteoarthritis,    Abdominal   Peds negative pediatric ROS (+)  Hematology negative hematology ROS (+)   Anesthesia Other Findings   Reproductive/Obstetrics negative OB ROS                             Anesthesia Physical Anesthesia Plan  ASA: 3  Anesthesia Plan: General   Post-op Pain Management: Minimal or no pain anticipated   Induction: Intravenous  PONV Risk Score and Plan: 1 and Propofol infusion  Airway Management Planned: Nasal Cannula and Natural Airway  Additional Equipment:   Intra-op Plan:   Post-operative Plan:   Informed Consent: I have reviewed the patients History and Physical, chart, labs and discussed the procedure including the risks, benefits and alternatives for the proposed anesthesia with the patient or authorized representative who has indicated his/her understanding and acceptance.     Dental advisory given  Plan Discussed with: CRNA and Surgeon  Anesthesia Plan Comments:         Anesthesia Quick Evaluation

## 2023-05-22 NOTE — Interval H&P Note (Signed)
History and Physical Interval Note:  05/22/2023 11:15 AM  David Dougherty  has presented today for surgery, with the diagnosis of history of polyps,GERD,Barrett's esophagus,change in bowel habits.  The various methods of treatment have been discussed with the patient and family. After consideration of risks, benefits and other options for treatment, the patient has consented to  Procedure(s) with comments: COLONOSCOPY WITH PROPOFOL (N/A) - 12:30 pm, asa 3 ESOPHAGOGASTRODUODENOSCOPY (EGD) WITH PROPOFOL (N/A) as a surgical intervention.  The patient's history has been reviewed, patient examined, no change in status, stable for surgery.  I have reviewed the patient's chart and labs.  Questions were answered to the patient's satisfaction.     [name]

## 2023-05-22 NOTE — Transfer of Care (Signed)
Immediate Anesthesia Transfer of Care Note  Patient: David Dougherty  Procedure(s) Performed: COLONOSCOPY WITH PROPOFOL ESOPHAGOGASTRODUODENOSCOPY (EGD) WITH PROPOFOL BIOPSY POLYPECTOMY  Patient Location: Short Stay  Anesthesia Type:General  Level of Consciousness: drowsy  Airway & Oxygen Therapy: Patient Spontanous Breathing  Post-op Assessment: Report given to RN and Post -op Vital signs reviewed and stable  Post vital signs: Reviewed and stable  Last Vitals:  Vitals Value Taken Time  BP    Temp    Pulse    Resp    SpO2      Last Pain:  Vitals:   05/22/23 1222  TempSrc:   PainSc: 0-No pain      Patients Stated Pain Goal: 7 (05/22/23 1053)  Complications: No notable events documented.

## 2023-05-22 NOTE — Discharge Instructions (Signed)
EGD Discharge instructions Please read the instructions outlined below and refer to this sheet in the next few weeks. These discharge instructions provide you with general information on caring for yourself after you leave the hospital. Your doctor may also give you specific instructions. While your treatment has been planned according to the most current medical practices available, unavoidable complications occasionally occur. If you have any problems or questions after discharge, please call your doctor. ACTIVITY You may resume your regular activity but move at a slower pace for the next 24 hours.  Take frequent rest periods for the next 24 hours.  Walking will help expel (get rid of) the air and reduce the bloated feeling in your abdomen.  No driving for 24 hours (because of the anesthesia (medicine) used during the test).  You may shower.  Do not sign any important legal documents or operate any machinery for 24 hours (because of the anesthesia used during the test).  NUTRITION Drink plenty of fluids.  You may resume your normal diet.  Begin with a light meal and progress to your normal diet.  Avoid alcoholic beverages for 24 hours or as instructed by your caregiver.  MEDICATIONS You may resume your normal medications unless your caregiver tells you otherwise.  WHAT YOU CAN EXPECT TODAY You may experience abdominal discomfort such as a feeling of fullness or "gas" pains.  FOLLOW-UP Your doctor will discuss the results of your test with you.  SEEK IMMEDIATE MEDICAL ATTENTION IF ANY OF THE FOLLOWING OCCUR: Excessive nausea (feeling sick to your stomach) and/or vomiting.  Severe abdominal pain and distention (swelling).  Trouble swallowing.  Temperature over 101 F (37.8 C).  Rectal bleeding or vomiting of blood.     Colonoscopy Discharge Instructions  Read the instructions outlined below and refer to this sheet in the next few weeks. These discharge instructions provide you with  general information on caring for yourself after you leave the hospital. Your doctor may also give you specific instructions. While your treatment has been planned according to the most current medical practices available, unavoidable complications occasionally occur.   ACTIVITY You may resume your regular activity, but move at a slower pace for the next 24 hours.  Take frequent rest periods for the next 24 hours.  Walking will help get rid of the air and reduce the bloated feeling in your belly (abdomen).  No driving for 24 hours (because of the medicine (anesthesia) used during the test).   Do not sign any important legal documents or operate any machinery for 24 hours (because of the anesthesia used during the test).  NUTRITION Drink plenty of fluids.  You may resume your normal diet as instructed by your doctor.  Begin with a light meal and progress to your normal diet. Heavy or fried foods are harder to digest and may make you feel sick to your stomach (nauseated).  Avoid alcoholic beverages for 24 hours or as instructed.  MEDICATIONS You may resume your normal medications unless your doctor tells you otherwise.  WHAT YOU CAN EXPECT TODAY Some feelings of bloating in the abdomen.  Passage of more gas than usual.  Spotting of blood in your stool or on the toilet paper.  IF YOU HAD POLYPS REMOVED DURING THE COLONOSCOPY: No aspirin products for 7 days or as instructed.  No alcohol for 7 days or as instructed.  Eat a soft diet for the next 24 hours.  FINDING OUT THE RESULTS OF YOUR TEST Not all test results are  available during your visit. If your test results are not back during the visit, make an appointment with your caregiver to find out the results. Do not assume everything is normal if you have not heard from your caregiver or the medical facility. It is important for you to follow up on all of your test results.  SEEK IMMEDIATE MEDICAL ATTENTION IF: You have more than a spotting of  blood in your stool.  Your belly is swollen (abdominal distention).  You are nauseated or vomiting.  You have a temperature over 101.  You have abdominal pain or discomfort that is severe or gets worse throughout the day.   Your EGD revealed mild amount inflammation in your stomach.  I took biopsies of this to rule out infection with a bacteria called H. pylori.  Await pathology results, my office will contact you.  Barrett's esophagus appeared stable.  Short segment.  I took samples of this today as well.  Your colonoscopy revealed 8 polyp(s) which I removed successfully. Await pathology results, my office will contact you. I recommend repeating colonoscopy in 3 years for surveillance purposes.   Follow-up in GI office in 3 months.   I hope you have a great rest of your week!  Hennie Duos. Marletta Lor, D.O. Gastroenterology and Hepatology [facility]

## 2023-05-23 LAB — SURGICAL PATHOLOGY

## 2023-05-24 ENCOUNTER — Telehealth: Payer: Self-pay | Admitting: Family Medicine

## 2023-05-24 NOTE — Telephone Encounter (Signed)
Patient had his colonoscopy done and he started back taking coumadin 2 days ago. He has taken it twice. He wanted to know if that's ok.

## 2023-05-24 NOTE — Telephone Encounter (Signed)
That is fine I recommend a nurse visit as planned on the 25th to check INR Please keep nurse visit

## 2023-05-25 ENCOUNTER — Other Ambulatory Visit: Payer: Self-pay | Admitting: Family Medicine

## 2023-05-25 NOTE — Telephone Encounter (Signed)
Patient informed per drs notes and recommendations 

## 2023-05-30 ENCOUNTER — Ambulatory Visit: Payer: Medicare Other

## 2023-05-30 ENCOUNTER — Encounter (HOSPITAL_COMMUNITY): Payer: Self-pay | Admitting: Internal Medicine

## 2023-05-30 ENCOUNTER — Ambulatory Visit (INDEPENDENT_AMBULATORY_CARE_PROVIDER_SITE_OTHER): Payer: Medicare Other

## 2023-05-30 DIAGNOSIS — Z7901 Long term (current) use of anticoagulants: Secondary | ICD-10-CM

## 2023-05-30 LAB — POCT INR: INR: 1.4 — AB (ref 2.0–3.0)

## 2023-05-30 NOTE — Patient Instructions (Signed)
Todays INR result - 1.4  Medication instruction: Tablet strength 5mg   Sunday - take 2 tablets Monday - take 2 tablets Tuesday- take 2 tablets Wednesday- take 1 1/2 tablets Thursday - take 2 tablets Friday- take 2 tablets Saturday-take 1 1/2 tablets  Next INR check in 2 weeks  If any problems or changes before next visit call or follow up.  Patient verbalized understanding.

## 2023-06-01 ENCOUNTER — Other Ambulatory Visit: Payer: Self-pay | Admitting: Family Medicine

## 2023-06-13 ENCOUNTER — Ambulatory Visit (INDEPENDENT_AMBULATORY_CARE_PROVIDER_SITE_OTHER): Payer: Medicare Other

## 2023-06-13 DIAGNOSIS — Z7901 Long term (current) use of anticoagulants: Secondary | ICD-10-CM | POA: Diagnosis not present

## 2023-06-13 LAB — POCT INR: POC INR: 2.3

## 2023-06-16 ENCOUNTER — Other Ambulatory Visit: Payer: Self-pay | Admitting: Family Medicine

## 2023-06-23 ENCOUNTER — Emergency Department (HOSPITAL_COMMUNITY): Payer: Medicare Other

## 2023-06-23 ENCOUNTER — Encounter (HOSPITAL_COMMUNITY): Payer: Self-pay | Admitting: Emergency Medicine

## 2023-06-23 ENCOUNTER — Other Ambulatory Visit: Payer: Self-pay

## 2023-06-23 ENCOUNTER — Emergency Department (HOSPITAL_COMMUNITY)
Admission: EM | Admit: 2023-06-23 | Discharge: 2023-06-23 | Disposition: A | Discharge status: Home / Self Care | Payer: Medicare Other | Attending: Student | Admitting: Student

## 2023-06-23 DIAGNOSIS — S93601A Unspecified sprain of right foot, initial encounter: Secondary | ICD-10-CM | POA: Diagnosis not present

## 2023-06-23 DIAGNOSIS — I2510 Atherosclerotic heart disease of native coronary artery without angina pectoris: Secondary | ICD-10-CM | POA: Insufficient documentation

## 2023-06-23 DIAGNOSIS — Y92009 Unspecified place in unspecified non-institutional (private) residence as the place of occurrence of the external cause: Secondary | ICD-10-CM | POA: Diagnosis not present

## 2023-06-23 DIAGNOSIS — S99921A Unspecified injury of right foot, initial encounter: Secondary | ICD-10-CM | POA: Diagnosis present

## 2023-06-23 DIAGNOSIS — Z87891 Personal history of nicotine dependence: Secondary | ICD-10-CM | POA: Diagnosis not present

## 2023-06-23 DIAGNOSIS — I10 Essential (primary) hypertension: Secondary | ICD-10-CM | POA: Insufficient documentation

## 2023-06-23 DIAGNOSIS — Z7901 Long term (current) use of anticoagulants: Secondary | ICD-10-CM | POA: Diagnosis not present

## 2023-06-23 DIAGNOSIS — W108XXA Fall (on) (from) other stairs and steps, initial encounter: Secondary | ICD-10-CM | POA: Diagnosis not present

## 2023-06-23 DIAGNOSIS — I4891 Unspecified atrial fibrillation: Secondary | ICD-10-CM | POA: Insufficient documentation

## 2023-06-23 DIAGNOSIS — Z79899 Other long term (current) drug therapy: Secondary | ICD-10-CM | POA: Diagnosis not present

## 2023-06-23 DIAGNOSIS — Z955 Presence of coronary angioplasty implant and graft: Secondary | ICD-10-CM | POA: Insufficient documentation

## 2023-06-23 MED ORDER — OXYCODONE HCL 5 MG PO TABS: 5.0000 mg | ORAL_TABLET | Freq: Four times a day (QID) | ORAL | 0 refills | Status: DC | PRN

## 2023-06-23 MED ORDER — ACETAMINOPHEN 500 MG PO TABS: 1000.0000 mg | ORAL_TABLET | Freq: Three times a day (TID) | ORAL | 0 refills | Status: AC

## 2023-06-23 MED ORDER — HYDROCODONE-ACETAMINOPHEN 5-325 MG PO TABS
1.0000 | ORAL_TABLET | Freq: Once | ORAL | Status: AC
  Administered 2023-06-23: 1 via ORAL
  Filled 2023-06-23: qty 1

## 2023-06-23 NOTE — Discharge Instructions (Signed)
For pain:  - Acetaminophen 1000 mg three times daily (every 8 hours) - oxycodone for breakthrough pain only

## 2023-06-23 NOTE — ED Triage Notes (Signed)
Pt via POV c/o pain to right foot after he misstepped at home 2 hours PTA and hit his foot. Pt has had broken toes for a month or two, and now the reinjured area is very sore and swollen. Pain rated 8/10 and pt is unable to put a shoe on his right foot due to swelling.

## 2023-06-23 NOTE — ED Notes (Signed)
CAM boot applied to right lower leg. Cap erfill less than 2 sec, Pt tolerated well.

## 2023-06-23 NOTE — ED Provider Notes (Signed)
[place] EMERGENCY DEPARTMENT AT [facility] Provider Note  CSN: [id number] Arrival date & time: 06/23/23 1749  Chief Complaint(s) Foot Pain  HPI David Dougherty is a 72 y.o. male with PMH A-fib on Coumadin who presents emergency department for evaluation of right foot pain after a fall.  Patient had a previous injury to the midfoot that has been healing and he has follow-up with a podiatrist early next month.  He slipped off a stair and reinjured the foot today and arrives with pain and swelling to the foot.  Pain primarily worse over the dorsum of the foot near the first digit.  Denies chest pain, shortness of breath, abdominal pain, nausea, vomiting, numbness, tingling, weakness or other systemic or neurologic complaints.   Past Medical History Past Medical History:  Diagnosis Date   Arthritis    Atrial fibrillation (HCC)    CAD (coronary artery disease)    70% distal circumflex 11/2014    Dysrhythmia    AFib   GERD (gastroesophageal reflux disease)    Nonischemic cardiomyopathy (HCC)    Tobacco abuse    Snuff   Patient Active Problem List   Diagnosis Date Noted   History of colonic polyps 04/05/2018   Vitamin D deficiency 01/12/2017   Osteopenia 11/03/2016   Cervical pain 11/25/2015   Cervical spondylosis without myelopathy 11/25/2015   Hemorrhoids 06/02/2015   Loose stools 06/02/2015   Chest pain 05/06/2015   H/O cardiomyopathy 05/06/2015   Essential hypertension 05/06/2015   Barrett's esophagus    Atherosclerotic heart disease of native coronary artery with other forms of angina pectoris (HCC) 12/22/2014   Rectal bleeding 12/18/2014   Elevated PSA 11/20/2014   Hyperlipidemia 11/20/2014   Elevated fasting glucose 11/20/2014   Atrial fibrillation (HCC) 08/12/2010   GASTROESOPHAGEAL REFLUX DISEASE 08/12/2010   Home Medication(s) Prior to Admission medications   Medication Sig Start Date End Date Taking? Authorizing Provider  acetaminophen (TYLENOL)  500 MG tablet Take 2 tablets (1,000 mg total) by mouth every 8 (eight) hours. 06/23/23 07/23/23 Yes Alec Jaros, MD  oxyCODONE (ROXICODONE) 5 MG immediate release tablet Take 1 tablet (5 mg total) by mouth every 6 (six) hours as needed for breakthrough pain. 06/23/23  Yes Shalunda Lindh, MD  atorvastatin (LIPITOR) 40 MG tablet Take 1 tablet by mouth once daily 04/20/23   Babs Sciara, MD  calcium carbonate (TUMS - DOSED IN MG ELEMENTAL CALCIUM) 500 MG chewable tablet Chew 2-3 tablets by mouth as needed for indigestion or heartburn.     [provider]  carvedilol (COREG) 3.125 MG tablet Take 1 tablet by mouth twice daily 03/16/23   Babs Sciara, MD  Cholecalciferol (VITAMIN D-3) 125 MCG (5000 UT) TABS Take 5,000 Units by mouth daily.    [provider]  fluticasone (FLONASE) 50 MCG/ACT nasal spray Place 2 sprays into both nostrils daily. 03/24/21   Babs Sciara, MD  furosemide (LASIX) 20 MG tablet Take 1 tablet (20 mg total) by mouth daily. 05/08/23   Babs Sciara, MD  losartan (COZAAR) 25 MG tablet Take 1 tablet by mouth once daily 05/29/23   Luking, Jonna Coup, MD  nitroGLYCERIN (NITROSTAT) 0.4 MG SL tablet DISSOLVE ONE TABLET UNDER THE TONGUE EVERY 5 MINUTES AS NEEDED FOR CHEST PAIN.  DO NOT EXCEED A TOTAL OF 3 DOSES IN 15 MINUTES. CALL 911. 04/25/23   Babs Sciara, MD  omeprazole (PRILOSEC) 20 MG capsule Take 1 capsule by mouth once daily 06/19/23   Luking,  Jonna Coup, MD  oxymetazoline (AFRIN) 0.05 % nasal spray Place 1 spray into both nostrils 2 (two) times daily as needed for congestion.    [provider]  pregabalin (LYRICA) 25 MG capsule TAKE 1 CAPSULE BY MOUTH BEFORE SUPPER AND 1 CAPSULE  AT BEDTIME FOR  FOOT  NERVE  PAIN 04/27/23   Babs Sciara, MD  tamsulosin (FLOMAX) 0.4 MG CAPS capsule Take 1 capsule (0.4 mg total) by mouth daily after supper. 09/05/22   Jerilee Field, MD  topiramate (TOPAMAX) 50 MG tablet Take 1 tablet by mouth twice daily 05/08/23    Babs Sciara, MD  warfarin (COUMADIN) 5 MG tablet TAKE 1 TO 2 TABLETS BY MOUTH ONCE DAILY BASED ON INR 02/02/23   Babs Sciara, MD                                                                                                                                    Past Surgical History Past Surgical History:  Procedure Laterality Date   APPENDECTOMY     BIOPSY  05/22/2023   Procedure: BIOPSY;  Surgeon: [name], DO;  [facility];  Service: Endoscopy;;   CATARACT EXTRACTION W/PHACO Left 11/18/2019   Procedure: CATARACT EXTRACTION PHACO AND INTRAOCULAR LENS PLACEMENT (IOC);  Surgeon: Fabio Pierce, MD;  Location: [facility];  Service: Ophthalmology;  Laterality: Left;  CDE: 9.66   CATARACT EXTRACTION W/PHACO Right 12/04/2019   Procedure: CATARACT EXTRACTION PHACO AND INTRAOCULAR LENS PLACEMENT RIGHT EYE (CDE: 4.56);  Surgeon: Fabio Pierce, MD;  Location: [facility];  Service: Ophthalmology;  Laterality: Right;   COLONOSCOPY  2005   Neg   COLONOSCOPY N/A 01/05/2015   SLF:17 colon polyps removed/moderate sized hemorrhoids   COLONOSCOPY WITH PROPOFOL N/A 06/19/2018   Procedure: COLONOSCOPY WITH PROPOFOL;  Surgeon: West Bali, MD;  [facility]: [facility];  Service: Endoscopy;  Laterality: N/A;  7:30am   COLONOSCOPY WITH PROPOFOL N/A 05/22/2023   Procedure: COLONOSCOPY WITH PROPOFOL;  Surgeon: [name], DO;  [facility]: [facility];  Service: Endoscopy;  Laterality: N/A;  12:30 pm, asa 3   ESOPHAGOGASTRODUODENOSCOPY N/A 01/05/2015   [id number] at gastro junction/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/small HH/mild erosive gastrtis and duodenitis   ESOPHAGOGASTRODUODENOSCOPY (EGD) WITH PROPOFOL N/A 06/19/2018   Procedure: ESOPHAGOGASTRODUODENOSCOPY (EGD) WITH PROPOFOL;  Surgeon: West Bali, MD;  [facility];  Service: Endoscopy;  Laterality: N/A;   ESOPHAGOGASTRODUODENOSCOPY (EGD) WITH PROPOFOL N/A 05/22/2023   Procedure: ESOPHAGOGASTRODUODENOSCOPY (EGD) WITH  PROPOFOL;  Surgeon: [name], DO;  [facility];  Service: Endoscopy;  Laterality: N/A;   HEMORRHOID BANDING N/A 01/05/2015   Procedure: HEMORRHOID BANDING;  Surgeon: West Bali, MD;  [facility];  Service: Endoscopy;  Laterality: N/A;   LEFT AND RIGHT HEART CATHETERIZATION WITH CORONARY ANGIOGRAM N/A 12/03/2014   Procedure: LEFT AND RIGHT HEART CATHETERIZATION WITH CORONARY ANGIOGRAM;  Surgeon: Veverly Fells  Excell Seltzer, MD;  Location: [facility] CATH LAB;  Service: Cardiovascular;  Laterality: N/A;   POLYPECTOMY  06/19/2018   Procedure: POLYPECTOMY;  Surgeon: West Bali, MD;  [facility];  Service: Endoscopy;;  rectal    POLYPECTOMY  05/22/2023   Procedure: POLYPECTOMY;  Surgeon: [name], DO;  [facility];  Service: Endoscopy;;   SAVORY DILATION  06/19/2018   Procedure: SAVORY DILATION;  Surgeon: West Bali, MD;  [facility];  Service: Endoscopy;;   TONSILLECTOMY     Family History Family History  Problem Relation Age of Onset   Heart attack Father    Hypertension Father    Diabetes Father    Colon cancer Neg Hx    Colon polyps Neg Hx     Social History Social History   Tobacco Use   Smoking status: Former    Current packs/day: 0.00    Average packs/day: 0.3 packs/day for 3.0 years (0.8 ttl pk-yrs)    Types: Cigars, Cigarettes    Start date: 10/19/1969    Quit date: 10/22/1972    Years since quitting: 50.7   Smokeless tobacco: Former    Types: Snuff  Vaping Use   Vaping status: Never Used  Substance Use Topics   Alcohol use: No    Comment: occ   Drug use: No   Allergies Reglan [metoclopramide]  Review of Systems Review of Systems  Musculoskeletal:  Positive for arthralgias, joint swelling and myalgias.    Physical Exam Vital Signs  I have reviewed the triage vital signs BP (!) 153/89 (BP Location: Right Arm)   Pulse 86   Temp 98 F (36.7 C) (Oral)   Resp 18   Ht 6\' 1"  (1.854 m)   Wt  95.3 kg   SpO2 99%   BMI 27.71 kg/m   Physical Exam Constitutional:      General: He is not in acute distress.    Appearance: Normal appearance.  HENT:     Head: Normocephalic and atraumatic.     Nose: No congestion or rhinorrhea.  Eyes:     General:        Right eye: No discharge.        Left eye: No discharge.     Extraocular Movements: Extraocular movements intact.     Pupils: Pupils are equal, round, and reactive to light.  Cardiovascular:     Rate and Rhythm: Normal rate and regular rhythm.     Heart sounds: No murmur heard. Pulmonary:     Effort: No respiratory distress.     Breath sounds: No wheezing or rales.  Abdominal:     General: There is no distension.     Tenderness: There is no abdominal tenderness.  Musculoskeletal:        General: Swelling and tenderness present. Normal range of motion.     Cervical back: Normal range of motion.  Skin:    General: Skin is warm and dry.  Neurological:     General: No focal deficit present.     Mental Status: He is alert.     ED Results and Treatments Labs (all labs ordered are listed, but only abnormal results are displayed) Labs Reviewed - No data to display  Radiology DG Foot Complete Right  Result Date: 06/23/2023 CLINICAL DATA:  Trauma, pain EXAM: RIGHT FOOT COMPLETE - 3+ VIEW COMPARISON:  None Available. FINDINGS: No displaced fracture or dislocation is seen. Small smoothly marginated calcifications adjacent to the lateral margin of head of the proximal phalanx of second toe may be residual from previous injury. Small bony spurs are seen in first metatarsophalangeal joint. No opaque foreign bodies are seen. There is soft tissue swelling over the dorsal aspect. Bony spurs are seen in talonavicular joint. IMPRESSION: No recent fracture or dislocation is seen in right foot. Electronically Signed   By:  Ernie Avena M.D.   On: 06/23/2023 18:37    Pertinent labs & imaging results that were available during my care of the patient were reviewed by me and considered in my medical decision making (see MDM for details).  Medications Ordered in ED Medications  HYDROcodone-acetaminophen (NORCO/VICODIN) 5-325 MG per tablet 1 tablet (has no administration in time range)                                                                                                                                     Procedures Procedures  (including critical care time)  Medical Decision Making / ED Course   This patient presents to the ED for concern of fall, foot pain, this involves an extensive number of treatment options, and is a complaint that carries with it a high risk of complications and morbidity.  The differential diagnosis includes fracture, hematoma, ligamentous injury, contusion, sprain  MDM: Patient seen emergency room for evaluation of foot pain.  Physical exam with tenderness over the dorsum of the foot near the first digit but is otherwise unremarkable.  Neurovascularly intact.  X-ray imaging without acute injury.  Presentation consistent with sprain and he was placed in a cam boot and given crutches.  Given patient's Coumadin use, unable to proceed with NSAID therapy unable started on a Tylenol regimen with oxycodone for breakthrough pain only.  Education provided around RICE therapy and patient will call his foot doctor for closer follow-up.  Patient to discharge as he does not meet inpatient criteria for admission.   Additional history obtained: -Additional history obtained from wife -External records from outside source obtained and reviewed including: Chart review including previous notes, labs, imaging, consultation notes   Imaging Studies ordered: I ordered imaging studies including foot x-ray I independently visualized and interpreted imaging. I agree with the radiologist  interpretation   Medicines ordered and prescription drug management: Meds ordered this encounter  Medications   HYDROcodone-acetaminophen (NORCO/VICODIN) 5-325 MG per tablet 1 tablet   acetaminophen (TYLENOL) 500 MG tablet    Sig: Take 2 tablets (1,000 mg total) by mouth every 8 (eight) hours.    Dispense:  180 tablet    Refill:  0   oxyCODONE (ROXICODONE) 5 MG immediate release tablet    Sig: Take 1 tablet (5 mg total)  by mouth every 6 (six) hours as needed for breakthrough pain.    Dispense:  10 tablet    Refill:  0    -I have reviewed the patients home medicines and have made adjustments as needed  Critical interventions none   Cardiac Monitoring: The patient was maintained on a cardiac monitor.  I personally viewed and interpreted the cardiac monitored which showed an underlying rhythm of: NSR  Social Determinants of Health:  Factors impacting patients care include: none   Reevaluation: After the interventions noted above, I reevaluated the patient and found that they have :improved  Co morbidities that complicate the patient evaluation  Past Medical History:  Diagnosis Date   Arthritis    Atrial fibrillation (HCC)    CAD (coronary artery disease)    70% distal circumflex 11/2014    Dysrhythmia    AFib   GERD (gastroesophageal reflux disease)    Nonischemic cardiomyopathy (HCC)    Tobacco abuse    Snuff      Dispostion: I considered admission for this patient, but at this time he does not meet inpatient criteria for admission he is safe for discharge outpatient follow-up     Final Clinical Impression(s) / ED Diagnoses Final diagnoses:  Sprain of right foot, initial encounter     @PCDICTATION @    Glendora Score, MD 06/23/23 [phone]

## 2023-06-30 ENCOUNTER — Telehealth: Payer: Self-pay

## 2023-06-30 NOTE — Telephone Encounter (Signed)
Transition Care Management Unsuccessful Follow-up Telephone Call  Date of discharge and from where:  [facility]  Attempts:  1st Attempt  Reason for unsuccessful TCM follow-up call:  No answer/busy   Lenard Forth [facility] Guide, [facility] [phone] [address] [place], [place], [place] [address] Phone: [phone] Email: [email or web address]

## 2023-06-30 NOTE — Telephone Encounter (Signed)
Transition Care Management Unsuccessful Follow-up Telephone Call  Date of discharge and from where:  [facility] 7/19  Attempts:  2nd Attempt  Reason for unsuccessful TCM follow-up call:  No answer/busy   David Dougherty [facility] Guide, [facility] [phone] [address] [place], [place], [place] [address] Phone: [phone] Email: [email or web address]

## 2023-07-11 ENCOUNTER — Ambulatory Visit (INDEPENDENT_AMBULATORY_CARE_PROVIDER_SITE_OTHER): Payer: Medicare Other | Admitting: *Deleted

## 2023-07-11 DIAGNOSIS — Z7901 Long term (current) use of anticoagulants: Secondary | ICD-10-CM

## 2023-07-11 LAB — POCT INR: POC INR: 2.3

## 2023-07-31 ENCOUNTER — Encounter: Payer: Self-pay | Admitting: Gastroenterology

## 2023-08-08 ENCOUNTER — Ambulatory Visit (INDEPENDENT_AMBULATORY_CARE_PROVIDER_SITE_OTHER): Payer: Medicare Other

## 2023-08-08 DIAGNOSIS — Z7901 Long term (current) use of anticoagulants: Secondary | ICD-10-CM | POA: Diagnosis not present

## 2023-08-08 LAB — POCT INR: POC INR: 2.3

## 2023-08-17 ENCOUNTER — Telehealth: Payer: Self-pay

## 2023-08-17 NOTE — Telephone Encounter (Signed)
Tried calling patient about Tamsulosin prescription refill with no answer, unable to leave vm due to mailbox full

## 2023-08-21 ENCOUNTER — Other Ambulatory Visit: Payer: Self-pay | Admitting: Family Medicine

## 2023-08-24 ENCOUNTER — Other Ambulatory Visit: Payer: Self-pay | Admitting: Family Medicine

## 2023-08-30 NOTE — Telephone Encounter (Signed)
Patient is made aware Tamsulosin Rx sent to pharmacy. Patient voiced understanding

## 2023-09-07 ENCOUNTER — Ambulatory Visit: Payer: Medicare Other

## 2023-09-07 DIAGNOSIS — Z7901 Long term (current) use of anticoagulants: Secondary | ICD-10-CM

## 2023-09-07 LAB — POCT INR: INR: 2.2 (ref 2.0–3.0)

## 2023-09-07 NOTE — Patient Instructions (Signed)
Patient INR 2.2 , Patient advised per Dr Adriana Simas to take stay on same treatment and to come back to office for INR recheck in 4 weeks.

## 2023-09-12 ENCOUNTER — Other Ambulatory Visit: Payer: Self-pay | Admitting: Family Medicine

## 2023-09-18 ENCOUNTER — Other Ambulatory Visit: Payer: Self-pay | Admitting: Family Medicine

## 2023-10-05 ENCOUNTER — Ambulatory Visit: Payer: Medicare Other | Admitting: *Deleted

## 2023-10-05 DIAGNOSIS — Z7901 Long term (current) use of anticoagulants: Secondary | ICD-10-CM | POA: Diagnosis not present

## 2023-10-05 LAB — POCT INR: INR: 2 (ref 2.0–3.0)

## 2023-10-13 ENCOUNTER — Encounter: Payer: Self-pay | Admitting: Family Medicine

## 2023-10-13 ENCOUNTER — Other Ambulatory Visit: Payer: Self-pay | Admitting: Nurse Practitioner

## 2023-10-13 MED ORDER — PENICILLIN V POTASSIUM 500 MG PO TABS: ORAL_TABLET | ORAL | 0 refills | Status: DC

## 2023-10-13 NOTE — Telephone Encounter (Signed)
Sent in Pen[employer]K.

## 2023-10-18 ENCOUNTER — Other Ambulatory Visit: Payer: Self-pay | Admitting: Family Medicine

## 2023-10-30 ENCOUNTER — Other Ambulatory Visit: Payer: Self-pay | Admitting: Family Medicine

## 2023-11-07 ENCOUNTER — Ambulatory Visit: Payer: Medicare Other

## 2023-11-07 DIAGNOSIS — Z7901 Long term (current) use of anticoagulants: Secondary | ICD-10-CM

## 2023-11-07 LAB — POCT INR: POC INR: 2.5

## 2023-11-27 ENCOUNTER — Other Ambulatory Visit: Payer: Self-pay | Admitting: Family Medicine

## 2023-12-08 ENCOUNTER — Ambulatory Visit: Payer: Medicare Other

## 2023-12-08 DIAGNOSIS — Z7901 Long term (current) use of anticoagulants: Secondary | ICD-10-CM

## 2023-12-08 LAB — POCT INR: POC INR: 2.3

## 2023-12-15 ENCOUNTER — Telehealth: Payer: Self-pay | Admitting: Family Medicine

## 2023-12-15 MED ORDER — AMOXICILLIN-POT CLAVULANATE 875-125 MG PO TABS: 1.0000 | ORAL_TABLET | Freq: Two times a day (BID) | ORAL | 0 refills | Status: DC

## 2023-12-15 NOTE — Telephone Encounter (Signed)
 If he gets worse he will go to the ER or follow-up, no respiratory distress

## 2023-12-15 NOTE — Telephone Encounter (Signed)
 Patient with respiratory symptoms of head congestion drainage coughing sinus pressure was in the office with his wife because of progressive symptoms and needing to be seen we went ahead and did a straightforward courtesy visit his lungs sounded clear this appears to be a sinusitis antibiotics prescribed he will check INR next week

## 2023-12-18 ENCOUNTER — Other Ambulatory Visit: Payer: Self-pay | Admitting: Family Medicine

## 2023-12-20 ENCOUNTER — Ambulatory Visit (INDEPENDENT_AMBULATORY_CARE_PROVIDER_SITE_OTHER): Payer: Medicare Other

## 2023-12-20 DIAGNOSIS — Z7901 Long term (current) use of anticoagulants: Secondary | ICD-10-CM

## 2023-12-20 LAB — POCT INR: POC INR: 2.6

## 2024-01-09 ENCOUNTER — Ambulatory Visit: Payer: Medicare Other | Admitting: Nurse Practitioner

## 2024-01-09 DIAGNOSIS — Z7901 Long term (current) use of anticoagulants: Secondary | ICD-10-CM

## 2024-01-09 LAB — POCT INR: POC INR: 2.5

## 2024-01-10 NOTE — Progress Notes (Signed)
Pt is made aware per provider recommendations.

## 2024-01-22 NOTE — Progress Notes (Signed)
Reviewed patient's INR and current dosing schedule. Continue current regimen with recheck in one month. He was advised of this plan by the nurse.

## 2024-02-06 ENCOUNTER — Ambulatory Visit: Payer: Medicare Other

## 2024-02-06 DIAGNOSIS — Z7901 Long term (current) use of anticoagulants: Secondary | ICD-10-CM

## 2024-02-06 LAB — POCT INR: POC INR: 3.3

## 2024-02-14 ENCOUNTER — Other Ambulatory Visit: Payer: Self-pay

## 2024-02-14 DIAGNOSIS — Z125 Encounter for screening for malignant neoplasm of prostate: Secondary | ICD-10-CM

## 2024-02-14 DIAGNOSIS — E785 Hyperlipidemia, unspecified: Secondary | ICD-10-CM

## 2024-02-14 DIAGNOSIS — Z79899 Other long term (current) drug therapy: Secondary | ICD-10-CM

## 2024-02-14 DIAGNOSIS — R519 Headache, unspecified: Secondary | ICD-10-CM

## 2024-02-15 ENCOUNTER — Encounter: Payer: Self-pay | Admitting: Family Medicine

## 2024-02-15 LAB — CBC WITH DIFFERENTIAL/PLATELET
Basophils Absolute: 0 10*3/uL (ref 0.0–0.2)
Basos: 0 %
EOS (ABSOLUTE): 0.2 10*3/uL (ref 0.0–0.4)
Eos: 3 %
Hematocrit: 45.5 % (ref 37.5–51.0)
Hemoglobin: 14.5 g/dL (ref 13.0–17.7)
Immature Grans (Abs): 0 10*3/uL (ref 0.0–0.1)
Immature Granulocytes: 0 %
Lymphocytes Absolute: 1.5 10*3/uL (ref 0.7–3.1)
Lymphs: 29 %
MCH: 28.6 pg (ref 26.6–33.0)
MCHC: 31.9 g/dL (ref 31.5–35.7)
MCV: 90 fL (ref 79–97)
Monocytes Absolute: 0.4 10*3/uL (ref 0.1–0.9)
Monocytes: 7 %
Neutrophils Absolute: 3.2 10*3/uL (ref 1.4–7.0)
Neutrophils: 61 %
Platelets: 199 10*3/uL (ref 150–450)
RBC: 5.07 x10E6/uL (ref 4.14–5.80)
RDW: 13 % (ref 11.6–15.4)
WBC: 5.3 10*3/uL (ref 3.4–10.8)

## 2024-02-15 LAB — BASIC METABOLIC PANEL
BUN/Creatinine Ratio: 11 (ref 10–24)
BUN: 11 mg/dL (ref 8–27)
CO2: 18 mmol/L — ABNORMAL LOW (ref 20–29)
Calcium: 9.1 mg/dL (ref 8.6–10.2)
Chloride: 110 mmol/L — ABNORMAL HIGH (ref 96–106)
Creatinine, Ser: 1.01 mg/dL (ref 0.76–1.27)
Glucose: 112 mg/dL — ABNORMAL HIGH (ref 70–99)
Potassium: 4.5 mmol/L (ref 3.5–5.2)
Sodium: 145 mmol/L — ABNORMAL HIGH (ref 134–144)
eGFR: 79 mL/min/{1.73_m2} (ref >59)

## 2024-02-15 LAB — LIPID PANEL
Chol/HDL Ratio: 5.5 ratio — ABNORMAL HIGH (ref 0.0–5.0)
Cholesterol, Total: 177 mg/dL (ref 100–199)
HDL: 32 mg/dL — ABNORMAL LOW (ref >39)
LDL Chol Calc (NIH): 109 mg/dL — ABNORMAL HIGH (ref 0–99)
Triglycerides: 205 mg/dL — ABNORMAL HIGH (ref 0–149)
VLDL Cholesterol Cal: 36 mg/dL (ref 5–40)

## 2024-02-15 LAB — PSA: Prostate Specific Ag, Serum: 5 ng/mL — ABNORMAL HIGH (ref 0.0–4.0)

## 2024-02-15 LAB — C-REACTIVE PROTEIN: CRP: 1 mg/L (ref 0–10)

## 2024-02-15 LAB — SEDIMENTATION RATE: Sed Rate: 11 mm/h (ref 0–30)

## 2024-02-20 ENCOUNTER — Other Ambulatory Visit: Payer: Self-pay

## 2024-02-20 ENCOUNTER — Ambulatory Visit

## 2024-02-20 DIAGNOSIS — R972 Elevated prostate specific antigen [PSA]: Secondary | ICD-10-CM

## 2024-02-20 DIAGNOSIS — Z7901 Long term (current) use of anticoagulants: Secondary | ICD-10-CM

## 2024-02-20 LAB — POCT INR: POC INR: 2.9

## 2024-02-26 ENCOUNTER — Other Ambulatory Visit: Payer: Self-pay | Admitting: Family Medicine

## 2024-03-08 ENCOUNTER — Other Ambulatory Visit: Payer: Self-pay | Admitting: Family Medicine

## 2024-03-08 ENCOUNTER — Ambulatory Visit

## 2024-03-13 ENCOUNTER — Other Ambulatory Visit: Payer: Self-pay | Admitting: Family Medicine

## 2024-03-14 ENCOUNTER — Other Ambulatory Visit: Payer: Self-pay

## 2024-03-14 MED ORDER — CARVEDILOL 3.125 MG PO TABS: 3.1250 mg | ORAL_TABLET | Freq: Two times a day (BID) | ORAL | 1 refills | Status: DC

## 2024-03-19 ENCOUNTER — Ambulatory Visit

## 2024-03-29 ENCOUNTER — Ambulatory Visit: Payer: Medicare Other

## 2024-03-29 VITALS — Ht 73.0 in | Wt 210.0 lb

## 2024-03-29 DIAGNOSIS — Z0000 Encounter for general adult medical examination without abnormal findings: Secondary | ICD-10-CM | POA: Diagnosis not present

## 2024-03-29 NOTE — Progress Notes (Signed)
 Subjective:   David Dougherty is a 73 y.o. who presents for a Medicare Wellness preventive visit.  Visit Complete: Virtual I connected with  Kais L Mottern on 03/29/24 by a audio enabled telemedicine application and verified that I am speaking with the correct person using two identifiers.  Patient Location: Home  Provider Location: Home Office  I discussed the limitations of evaluation and management by telemedicine. The patient expressed understanding and agreed to proceed.  Vital Signs: Because this visit was a virtual/telehealth visit, some criteria may be missing or patient reported. Any vitals not documented were not able to be obtained and vitals that have been documented are patient reported.  VideoDeclined- This patient declined Librarian, academic. Therefore the visit was completed with audio only.  Persons Participating in Visit: Patient.  AWV Questionnaire: No: Patient Medicare AWV questionnaire was not completed prior to this visit.  Cardiac Risk Factors include: advanced age (>68men, >15 women);dyslipidemia;hypertension;male gender     Objective:    Today's Vitals   03/29/24 1421  Weight: 210 lb (95.3 kg)  Height: 6\' 1"  (1.854 m)   Body mass index is 27.71 kg/m.     03/29/2024    2:24 PM 06/23/2023    6:15 PM 05/22/2023   10:40 AM 05/18/2023   11:16 AM 03/24/2023    2:11 PM 03/09/2022    3:02 PM 03/08/2022    2:26 PM  Advanced Directives  Does Patient Have a Medical Advance Directive? No No No No No Yes No  Type of Advance Directive      Living will;Healthcare Power of Attorney   Does patient want to make changes to medical advance directive?      No - Patient declined   Copy of Healthcare Power of Attorney in Chart?      No - copy requested   Would patient like information on creating a medical advance directive? Yes (MAU/Ambulatory/Procedural Areas - Information given)   No - Patient declined No - Patient declined No - Patient  declined No - Patient declined    Current Medications (verified) Outpatient Encounter Medications as of 03/29/2024  Medication Sig   amoxicillin -clavulanate (AUGMENTIN ) 875-125 MG tablet Take 1 tablet by mouth 2 (two) times daily.   atorvastatin  (LIPITOR ) 40 MG tablet Take 1 tablet by mouth once daily   calcium  carbonate (TUMS - DOSED IN MG ELEMENTAL CALCIUM ) 500 MG chewable tablet Chew 2-3 tablets by mouth as needed for indigestion or heartburn.    carvedilol  (COREG ) 3.125 MG tablet Take 1 tablet (3.125 mg total) by mouth 2 (two) times daily.   Cholecalciferol  (VITAMIN D -3) 125 MCG (5000 UT) TABS Take 5,000 Units by mouth daily.   fluticasone  (FLONASE ) 50 MCG/ACT nasal spray Place 2 sprays into both nostrils daily.   furosemide  (LASIX ) 20 MG tablet Take 1 tablet (20 mg total) by mouth daily.   losartan  (COZAAR ) 25 MG tablet Take 1 tablet by mouth once daily   nitroGLYCERIN  (NITROSTAT ) 0.4 MG SL tablet DISSOLVE ONE TABLET UNDER THE TONGUE EVERY 5 MINUTES AS NEEDED FOR CHEST PAIN.  DO NOT EXCEED A TOTAL OF 3 DOSES IN 15 MINUTES. CALL 911.   omeprazole  (PRILOSEC) 20 MG capsule Take 1 capsule by mouth once daily   oxyCODONE  (ROXICODONE ) 5 MG immediate release tablet Take 1 tablet (5 mg total) by mouth every 6 (six) hours as needed for breakthrough pain.   oxymetazoline (AFRIN) 0.05 % nasal spray Place 1 spray into both nostrils 2 (two) times daily  as needed for congestion.   pregabalin  (LYRICA ) 25 MG capsule TAKE 1 CAPSULE BY MOUTH BEFORE SUPPER AND 1 CAPSULE AT BEDTIME FOR FOOT NERVE PAIN   tamsulosin  (FLOMAX ) 0.4 MG CAPS capsule Take 1 capsule (0.4 mg total) by mouth daily after supper.   topiramate  (TOPAMAX ) 50 MG tablet Take 1 tablet by mouth twice daily   warfarin (COUMADIN ) 5 MG tablet TAKE 1 TO 2 TABLETS BY MOUTH ONCE DAILY BASED  ON  INR   No facility-administered encounter medications on file as of 03/29/2024.    Allergies (verified) Reglan  [metoclopramide ]   History: Past Medical  History:  Diagnosis Date   Arthritis    Atrial fibrillation (HCC)    CAD (coronary artery disease)    70% distal circumflex 11/2014    Dysrhythmia    AFib   GERD (gastroesophageal reflux disease)    Nonischemic cardiomyopathy (HCC)    Tobacco abuse    Snuff   Past Surgical History:  Procedure Laterality Date   APPENDECTOMY     BIOPSY  05/22/2023   Procedure: BIOPSY;  Surgeon: Vinetta Greening, DO;  [facility];  Service: Endoscopy;;   CATARACT EXTRACTION W/PHACO Left 11/18/2019   Procedure: CATARACT EXTRACTION PHACO AND INTRAOCULAR LENS PLACEMENT (IOC);  Surgeon: Tarri Farm, MD;  Location: [facility];  Service: Ophthalmology;  Laterality: Left;  CDE: 9.66   CATARACT EXTRACTION W/PHACO Right 12/04/2019   Procedure: CATARACT EXTRACTION PHACO AND INTRAOCULAR LENS PLACEMENT RIGHT EYE (CDE: 4.56);  Surgeon: Tarri Farm, MD;  Location: [facility];  Service: Ophthalmology;  Laterality: Right;   COLONOSCOPY  2005   Neg   COLONOSCOPY N/A 01/05/2015   SLF:17 colon polyps removed/moderate sized hemorrhoids   COLONOSCOPY WITH PROPOFOL  N/A 06/19/2018   Procedure: COLONOSCOPY WITH PROPOFOL ;  Surgeon: Alyce Jubilee, MD;  [facility];  Service: Endoscopy;  Laterality: N/A;  7:30am   COLONOSCOPY WITH PROPOFOL  N/A 05/22/2023   Procedure: COLONOSCOPY WITH PROPOFOL ;  Surgeon: Vinetta Greening, DO;  [facility];  Service: Endoscopy;  Laterality: N/A;  12:30 pm, asa 3   ESOPHAGOGASTRODUODENOSCOPY N/A 01/05/2015   [id number] at gastro junction/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/small HH/mild erosive gastrtis and duodenitis   ESOPHAGOGASTRODUODENOSCOPY (EGD) WITH PROPOFOL  N/A 06/19/2018   Procedure: ESOPHAGOGASTRODUODENOSCOPY (EGD) WITH PROPOFOL ;  Surgeon: Alyce Jubilee, MD;  [facility];  Service: Endoscopy;  Laterality: N/A;   ESOPHAGOGASTRODUODENOSCOPY (EGD) WITH PROPOFOL  N/A 05/22/2023   Procedure: ESOPHAGOGASTRODUODENOSCOPY (EGD) WITH PROPOFOL ;  Surgeon:  Vinetta Greening, DO;  [facility];  Service: Endoscopy;  Laterality: N/A;   HEMORRHOID BANDING N/A 01/05/2015   Procedure: HEMORRHOID BANDING;  Surgeon: Alyce Jubilee, MD;  [facility];  Service: Endoscopy;  Laterality: N/A;   LEFT AND RIGHT HEART CATHETERIZATION WITH CORONARY ANGIOGRAM N/A 12/03/2014   Procedure: LEFT AND RIGHT HEART CATHETERIZATION WITH CORONARY ANGIOGRAM;  Surgeon: Arlander Bellman, MD;  Location[facility] C[facility];  Service: Cardiovascular;  Laterality: N/A;   POLYPECTOMY  06/19/2018   Procedure: POLYPECTOMY;  Surgeon: Alyce Jubilee, MD;  [facility];  Service: Endoscopy;;  rectal    POLYPECTOMY  05/22/2023   Procedure: POLYPECTOMY;  Surgeon: Vinetta Greening, DO;  [facility];  Service: Endoscopy;;   SAVORY DILATION  06/19/2018   Procedure: SAVORY DILATION;  Surgeon: Alyce Jubilee, MD;  [facility];  Service: Endoscopy;;   TONSILLECTOMY     Family History  Problem Relation Age of Onset  Heart attack Father    Hypertension Father    Diabetes Father    Colon cancer Neg Hx    Colon polyps Neg Hx    Social History   Socioeconomic History   Marital status: Married    Spouse name: Not on file   Number of children: Not on file   Years of education: Not on file   Highest education level: Not on file  Occupational History   Occupation: Engineer, site  Tobacco Use   Smoking status: Former    Current packs/day: 0.00    Average packs/day: 0.3 packs/day for 3.0 years (0.8 ttl pk-yrs)    Types: Cigars, Cigarettes    Start date: 10/19/1969    Quit date: 10/22/1972    Years since quitting: 51.4   Smokeless tobacco: Former    Types: Snuff  Vaping Use   Vaping status: Never Used  Substance and Sexual Activity   Alcohol use: No    Comment: occ   Drug use: No   Sexual activity: Yes  Other Topics Concern   Not on file  Social History Narrative   Married with 5 children   No regular exercise    Social Drivers of Health   Financial Resource Strain: Low Risk  (03/29/2024)   Overall Financial Resource Strain (CARDIA)    Difficulty of Paying Living Expenses: Not hard at all  Food Insecurity: No Food Insecurity (03/29/2024)   Hunger Vital Sign    Worried About Running Out of Food in the Last Year: Never true    Ran Out of Food in the Last Year: Never true  Transportation Needs: No Transportation Needs (03/29/2024)   PRAPARE - Administrator, Civil Service (Medical): No    Lack of Transportation (Non-Medical): No  Physical Activity: Sufficiently Active (03/24/2023)   Exercise Vital Sign    Days of Exercise per Week: 5 days    Minutes of Exercise per Session: 30 min  Stress: No Stress Concern Present (03/29/2024)   [employer] of Occupational Health - Occupational Stress Questionnaire    Feeling of Stress : Not at all  Social Connections: Socially Integrated (03/29/2024)   Social Connection and Isolation Panel [NHANES]    Frequency of Communication with Friends and Family: More than three times a week    Frequency of Social Gatherings with Friends and Family: More than three times a week    Attends Religious Services: More than 4 times per year    Active Member of [employer] or Organizations: Yes    Attends Engineer, structural: More than 4 times per year    Marital Status: Married    Tobacco Counseling Counseling given: Not Answered    Clinical Intake:  Pre-visit preparation completed: Yes  Pain : No/denies pain     Diabetes: No  Lab Results  Component Value Date   HGBA1C 6.0 (H) 04/15/2020   HGBA1C 5.6 11/20/2014     How often do you need to have someone help you when you read instructions, pamphlets, or other written materials from your doctor or pharmacy?: 1 - Never  Interpreter Needed?: No  Information entered by :: Seabron Cypress LPN   Activities of Daily Living     03/29/2024    2:21 PM 05/18/2023   11:14 AM  In your present  state of health, do you have any difficulty performing the following activities:  Hearing? 0 0  Vision? 0 0  Difficulty concentrating or making decisions? 0 0  Walking  or climbing stairs? 0 1  Dressing or bathing? 0 0  Doing errands, shopping? 0   Preparing Food and eating ? N   Using the Toilet? N   In the past six months, have you accidently leaked urine? N   Do you have problems with loss of bowel control? N   Managing your Medications? N   Managing your Finances? N   Housekeeping or managing your Housekeeping? N     Patient Care Team: Bennet Brasil, MD as PCP - General (Family Medicine) Gerard Knight, MD as Consulting Physician (Cardiology) Alyce Jubilee, MD (Inactive) as Consulting Physician (Gastroenterology)  Indicate any recent Medical Services you may have received from other than [facility] providers in the past year (date may be approximate).     Assessment:   This is a routine wellness examination for Janmichael.  Hearing/Vision screen Hearing Screening - Comments:: Denies hearing difficulties   Vision Screening - Comments:: No vision problems; will schedule routine eye exam soon     Goals Addressed             This Visit's Progress    Exercise 150 min/wk Moderate Activity   On track    Continue to exercise and stay healthy. Travel more, has cruise scheduled for Jan 2024.       Depression Screen     03/29/2024    2:23 PM 03/24/2023    2:08 PM 12/30/2022   11:41 AM 11/30/2022   11:31 AM 03/09/2022    3:00 PM 03/08/2022    2:23 PM 09/16/2021   11:04 AM  PHQ 2/9 Scores  PHQ - 2 Score 0 0 0 0 0 0 0  PHQ- 9 Score   0 0       Fall Risk     03/29/2024    2:24 PM 12/30/2022   11:40 AM 11/30/2022   11:30 AM 03/09/2022    3:00 PM 03/08/2022    2:28 PM  Fall Risk   Falls in the past year? 0 0 0 0 0  Number falls in past yr: 0 0 0  0  Injury with Fall? 0 0 0  0  Risk for fall due to : No Fall Risks    No Fall Risks  Follow up Falls prevention  discussed;Education provided;Falls evaluation completed    Falls prevention discussed    MEDICARE RISK AT HOME:  Medicare Risk at Home Any stairs in or around the home?: No If so, are there any without handrails?: No Home free of loose throw rugs in walkways, pet beds, electrical cords, etc?: Yes Adequate lighting in your home to reduce risk of falls?: Yes Life alert?: No Use of a cane, walker or w/c?: No Grab bars in the bathroom?: Yes Shower chair or bench in shower?: No Elevated toilet seat or a handicapped toilet?: Yes  TIMED UP AND GO:  Was the test performed?  No  Cognitive Function: 6CIT completed        03/29/2024    2:25 PM 03/24/2023    2:11 PM 03/08/2022    2:33 PM  6CIT Screen  What Year? 0 points 0 points 0 points  What month? 0 points 0 points 0 points  What time? 0 points 0 points 0 points  Count back from 20 0 points 0 points 0 points  Months in reverse 0 points 0 points 4 points  Repeat phrase 0 points 0 points 2 points  Total Score 0 points 0 points 6 points  Immunizations Immunization History  Administered Date(s) Administered   Influenza,inj,Quad PF,6+ Mos 11/03/2016, 09/03/2020   Moderna Sars-Covid-2 Vaccination 02/10/2020, 03/09/2020   Pneumococcal Conjugate-13 06/23/2016   Pneumococcal Polysaccharide-23 04/10/2015, 11/12/2020    Screening Tests Health Maintenance  Topic Date Due   Hepatitis C Screening  Never done   DTaP/Tdap/Td (1 - Tdap) Never done   Zoster Vaccines- Shingrix (1 of 2) Never done   COVID-19 Vaccine (3 - Moderna risk series) 04/06/2020   INFLUENZA VACCINE  07/05/2024   Medicare Annual Wellness (AWV)  03/29/2025   Colonoscopy  05/21/2028   Pneumonia Vaccine 56+ Years old  Completed   HPV VACCINES  Aged Out   Meningococcal B Vaccine  Aged Out    Health Maintenance  Health Maintenance Due  Topic Date Due   Hepatitis C Screening  Never done   DTaP/Tdap/Td (1 - Tdap) Never done   Zoster Vaccines- Shingrix (1 of 2)  Never done   COVID-19 Vaccine (3 - Moderna risk series) 04/06/2020   Health Maintenance Items Addressed: Declines vaccines at this time   Additional Screening:  Vision Screening: Recommended annual ophthalmology exams for early detection of glaucoma and other disorders of the eye.  Dental Screening: Recommended annual dental exams for proper oral hygiene  Community Resource Referral / Chronic Care Management: CRR required this visit?  No   CCM required this visit?  No     Plan:     I have personally reviewed and noted the following in the patient's chart:   Medical and social history Use of alcohol, tobacco or illicit drugs  Current medications and supplements including opioid prescriptions. Patient is not currently taking opioid prescriptions. Functional ability and status Nutritional status Physical activity Advanced directives List of other physicians Hospitalizations, surgeries, and ER visits in previous 12 months Vitals Screenings to include cognitive, depression, and falls Referrals and appointments  In addition, I have reviewed and discussed with patient certain preventive protocols, quality metrics, and best practice recommendations. A written personalized care plan for preventive services as well as general preventive health recommendations were provided to patient.     Seabron Cypress [place], [place]   1/61/0960   After Visit Summary: (MyChart) Due to this being a telephonic visit, the after visit summary with patients personalized plan was offered to patient via MyChart   Notes: Nothing significant to report at this time.

## 2024-03-29 NOTE — Patient Instructions (Signed)
 David Dougherty , Thank you for taking time to come for your Medicare Wellness Visit. I appreciate your ongoing commitment to your health goals. Please review the following plan we discussed and let me know if I can assist you in the future.   Referrals/Orders/Follow-Ups/Clinician Recommendations: Aim for 30 minutes of exercise or brisk walking, 6-8 glasses of water , and 5 servings of fruits and vegetables each day.  This is a list of the screening recommended for you and due dates:  Health Maintenance  Topic Date Due   Hepatitis C Screening  Never done   DTaP/Tdap/Td vaccine (1 - Tdap) Never done   Zoster (Shingles) Vaccine (1 of 2) Never done   COVID-19 Vaccine (3 - Moderna risk series) 04/06/2020   Flu Shot  07/05/2024   Medicare Annual Wellness Visit  03/29/2025   Colon Cancer Screening  05/21/2028   Pneumonia Vaccine  Completed   HPV Vaccine  Aged Out   Meningitis B Vaccine  Aged Out    Advanced directives: (ACP Link)Information on Advanced Care Planning can be found at [place]  Secretary of [facility] Advance Health Care Directives Advance Health Care Directives. [email or web address]   Next Medicare Annual Wellness Visit scheduled for next year: Yes

## 2024-04-08 ENCOUNTER — Ambulatory Visit: Admitting: Urology

## 2024-04-08 VITALS — BP 134/88 | HR 79

## 2024-04-08 DIAGNOSIS — N529 Male erectile dysfunction, unspecified: Secondary | ICD-10-CM

## 2024-04-08 DIAGNOSIS — R972 Elevated prostate specific antigen [PSA]: Secondary | ICD-10-CM

## 2024-04-08 DIAGNOSIS — R3912 Poor urinary stream: Secondary | ICD-10-CM

## 2024-04-08 DIAGNOSIS — N401 Enlarged prostate with lower urinary tract symptoms: Secondary | ICD-10-CM

## 2024-04-08 LAB — URINALYSIS, ROUTINE W REFLEX MICROSCOPIC
Bilirubin, UA: NEGATIVE
Glucose, UA: NEGATIVE
Ketones, UA: NEGATIVE
Leukocytes,UA: NEGATIVE
Nitrite, UA: NEGATIVE
RBC, UA: NEGATIVE
Specific Gravity, UA: 1.03 (ref 1.005–1.030)
Urobilinogen, Ur: 0.2 mg/dL (ref 0.2–1.0)
pH, UA: 6 (ref 5.0–7.5)

## 2024-04-08 MED ORDER — TADALAFIL 10 MG PO TABS: 10.0000 mg | ORAL_TABLET | Freq: Every day | ORAL | 3 refills | Status: AC | PRN

## 2024-04-08 NOTE — Progress Notes (Unsigned)
 04/08/2024 2:18 PM   David Dougherty 1951-11-11 [id number]  Referring provider: Bennet Brasil, MD [address] [place],  [place] [address]  No chief complaint on file.   HPI:  F/u -   1) PSA elevation - rising PSA of July 2020 2.9, July 2021 3.6, September 2022 4.3, November 2022 7.2. No h/o BPH. No prior bx. No FH PCa. He had a 50g benign prostate on DRE in 2022. A Dec 2022 pMRI showed a 1.9 cm PIRADS 3 lesion left PZ, prostate 72 grams and negative staging. His Jul 2023 PSA was down to 3.9.    2) ED - took Viagra .    3) BPH - prostate was 70 g on imaging. He has some PV dribble. NG risk includes CVA in 2022. He has a adequate stream but slow in the morning. IPSS 6.   Today,  seen for the above. October 2023 PSA 4.6.  February 2024 PSA 3.5.  March 2025 PSA 5.0.   He is on Coumadin  for afib and CAD. This is managed by Dr. Fairy Homer.    He retired from [employer] and now [employer] part time.   PMH: Past Medical History:  Diagnosis Date   Arthritis    Atrial fibrillation (HCC)    CAD (coronary artery disease)    70% distal circumflex 11/2014    Dysrhythmia    AFib   GERD (gastroesophageal reflux disease)    Nonischemic cardiomyopathy (HCC)    Tobacco abuse    Snuff    Surgical History: Past Surgical History:  Procedure Laterality Date   APPENDECTOMY     BIOPSY  05/22/2023   Procedure: BIOPSY;  Surgeon: Vinetta Greening, DO;  [facility];  Service: Endoscopy;;   CATARACT EXTRACTION W/PHACO Left 11/18/2019   Procedure: CATARACT EXTRACTION PHACO AND INTRAOCULAR LENS PLACEMENT (IOC);  Surgeon: Tarri Farm, MD;  Location: [facility];  Service: Ophthalmology;  Laterality: Left;  CDE: 9.66   CATARACT EXTRACTION W/PHACO Right 12/04/2019   Procedure: CATARACT EXTRACTION PHACO AND INTRAOCULAR LENS PLACEMENT RIGHT EYE (CDE: 4.56);  Surgeon: Tarri Farm, MD;  Location: [facility];  Service: Ophthalmology;  Laterality: Right;    COLONOSCOPY  2005   Neg   COLONOSCOPY N/A 01/05/2015   SLF:17 colon polyps removed/moderate sized hemorrhoids   COLONOSCOPY WITH PROPOFOL  N/A 06/19/2018   Procedure: COLONOSCOPY WITH PROPOFOL ;  Surgeon: Alyce Jubilee, MD;  [facility];  Service: Endoscopy;  Laterality: N/A;  7:30am   COLONOSCOPY WITH PROPOFOL  N/A 05/22/2023   Procedure: COLONOSCOPY WITH PROPOFOL ;  Surgeon: Vinetta Greening, DO;  [facility];  Service: Endoscopy;  Laterality: N/A;  12:30 pm, asa 3   ESOPHAGOGASTRODUODENOSCOPY N/A 01/05/2015   [id number] at gastro junction/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/small HH/mild erosive gastrtis and duodenitis   ESOPHAGOGASTRODUODENOSCOPY (EGD) WITH PROPOFOL  N/A 06/19/2018   Procedure: ESOPHAGOGASTRODUODENOSCOPY (EGD) WITH PROPOFOL ;  Surgeon: Alyce Jubilee, MD;  [facility];  Service: Endoscopy;  Laterality: N/A;   ESOPHAGOGASTRODUODENOSCOPY (EGD) WITH PROPOFOL  N/A 05/22/2023   Procedure: ESOPHAGOGASTRODUODENOSCOPY (EGD) WITH PROPOFOL ;  Surgeon: Vinetta Greening, DO;  [facility];  Service: Endoscopy;  Laterality: N/A;   HEMORRHOID BANDING N/A 01/05/2015   Procedure: HEMORRHOID BANDING;  Surgeon: Alyce Jubilee, MD;  [facility];  Service: Endoscopy;  Laterality: N/A;   LEFT AND RIGHT HEART CATHETERIZATION WITH CORONARY ANGIOGRAM N/A 12/03/2014   Procedure: LEFT AND RIGHT HEART CATHETERIZATION WITH CORONARY ANGIOGRAM;  Surgeon: Bambi Lever  Richad Champagne, MD;  Location: [facility] CATH LAB;  Service: Cardiovascular;  Laterality: N/A;   POLYPECTOMY  06/19/2018   Procedure: POLYPECTOMY;  Surgeon: Alyce Jubilee, MD;  [facility];  Service: Endoscopy;;  rectal    POLYPECTOMY  05/22/2023   Procedure: POLYPECTOMY;  Surgeon: Vinetta Greening, DO;  [facility];  Service: Endoscopy;;   SAVORY DILATION  06/19/2018   Procedure: SAVORY DILATION;  Surgeon: Alyce Jubilee, MD;  [facility];  Service: Endoscopy;;   TONSILLECTOMY       Home Medications:  Allergies as of 04/08/2024       Reactions   Reglan  [metoclopramide ] Anaphylaxis        Medication List        Accurate as of Apr 08, 2024  2:18 PM. If you have any questions, ask your nurse or doctor.          amoxicillin -clavulanate 875-125 MG tablet Commonly known as: AUGMENTIN  Take 1 tablet by mouth 2 (two) times daily.   atorvastatin  40 MG tablet Commonly known as: LIPITOR  Take 1 tablet by mouth once daily   calcium  carbonate 500 MG chewable tablet Commonly known as: TUMS - dosed in mg elemental calcium  Chew 2-3 tablets by mouth as needed for indigestion or heartburn.   carvedilol  3.125 MG tablet Commonly known as: COREG  Take 1 tablet (3.125 mg total) by mouth 2 (two) times daily.   fluticasone  50 MCG/ACT nasal spray Commonly known as: Flonase  Place 2 sprays into both nostrils daily.   furosemide  20 MG tablet Commonly known as: LASIX  Take 1 tablet (20 mg total) by mouth daily.   losartan  25 MG tablet Commonly known as: COZAAR  Take 1 tablet by mouth once daily   nitroGLYCERIN  0.4 MG SL tablet Commonly known as: NITROSTAT  DISSOLVE ONE TABLET UNDER THE TONGUE EVERY 5 MINUTES AS NEEDED FOR CHEST PAIN.  DO NOT EXCEED A TOTAL OF 3 DOSES IN 15 MINUTES. CALL 911.   omeprazole  20 MG capsule Commonly known as: PRILOSEC Take 1 capsule by mouth once daily   oxyCODONE  5 MG immediate release tablet Commonly known as: Roxicodone  Take 1 tablet (5 mg total) by mouth every 6 (six) hours as needed for breakthrough pain.   oxymetazoline 0.05 % nasal spray Commonly known as: AFRIN Place 1 spray into both nostrils 2 (two) times daily as needed for congestion.   pregabalin  25 MG capsule Commonly known as: LYRICA  TAKE 1 CAPSULE BY MOUTH BEFORE SUPPER AND 1 CAPSULE AT BEDTIME FOR FOOT NERVE PAIN   tamsulosin  0.4 MG Caps capsule Commonly known as: FLOMAX  Take 1 capsule (0.4 mg total) by mouth daily after supper.   topiramate  50 MG  tablet Commonly known as: TOPAMAX  Take 1 tablet by mouth twice daily   Vitamin D -3 125 MCG (5000 UT) Tabs Take 5,000 Units by mouth daily.   warfarin 5 MG tablet Commonly known as: COUMADIN  Take as directed by the anticoagulation clinic. If you are unsure how to take this medication, talk to your nurse or doctor. Original instructions: TAKE 1 TO 2 TABLETS BY MOUTH ONCE DAILY BASED  ON  INR        Allergies:  Allergies  Allergen Reactions   Reglan  [Metoclopramide ] Anaphylaxis    Family History: Family History  Problem Relation Age of Onset   Heart attack Father    Hypertension Father    Diabetes Father    Colon cancer Neg Hx    Colon polyps Neg Hx  Social History:  reports that he quit smoking about 51 years ago. His smoking use included cigars and cigarettes. He started smoking about 54 years ago. He has a 0.8 pack-year smoking history. He has quit using smokeless tobacco.  His smokeless tobacco use included snuff. He reports that he does not drink alcohol and does not use drugs.   Physical Exam: BP 134/88   Pulse 79   Constitutional:  Alert and oriented, No acute distress. HEENT: [place] AT, moist mucus membranes.  Trachea midline, no masses. Cardiovascular: No clubbing, cyanosis, or edema. Respiratory: Normal respiratory effort, no increased work of breathing. GI: Abdomen is soft, nontender, nondistended, no abdominal masses GU: No CVA tenderness Skin: No rashes, bruises or suspicious lesions. Neurologic: Grossly intact, no focal deficits, moving all 4 extremities. Psychiatric: Normal mood and affect.  Laboratory Data: Lab Results  Component Value Date   WBC 5.3 02/14/2024   HGB 14.5 02/14/2024   HCT 45.5 02/14/2024   MCV 90 02/14/2024   PLT 199 02/14/2024    Lab Results  Component Value Date   CREATININE 1.01 02/14/2024    Lab Results  Component Value Date   PSA 4.00 11/20/2014   PSA 4.60 (H) 11/13/2014    No results found for:  "TESTOSTERONE"  Lab Results  Component Value Date   HGBA1C 6.0 (H) 04/15/2020    Urinalysis    Component Value Date/Time   COLORURINE COLORLESS (A) 04/15/2020 1354   APPEARANCEUR Clear 09/05/2022 1432   LABSPEC 1.019 04/15/2020 1354   PHURINE 7.0 04/15/2020 1354   GLUCOSEU Trace (A) 09/05/2022 1432   HGBUR NEGATIVE 04/15/2020 1354   BILIRUBINUR Negative 09/05/2022 1432   KETONESUR NEGATIVE 04/15/2020 1354   PROTEINUR Negative 09/05/2022 1432   PROTEINUR NEGATIVE 04/15/2020 1354   NITRITE Negative 09/05/2022 1432   NITRITE NEGATIVE 04/15/2020 1354   LEUKOCYTESUR Negative 09/05/2022 1432   LEUKOCYTESUR NEGATIVE 04/15/2020 1354    Lab Results  Component Value Date   LABMICR See below: 09/05/2022   WBCUA None seen 09/05/2022   LABEPIT None seen 09/05/2022   BACTERIA None seen 09/05/2022    Pertinent Imaging:   Assessment & Plan:    1. Elevated PSA (Primary) PSA remains stable with normal age specific level and normal PSAD> repeat in 6 mo.   - Urinalysis, Routine w reflex microscopic   No follow-ups on file.  Christina Coyer, MD  [facility]  [address] [place], [place] [address] [phone]

## 2024-04-08 NOTE — Progress Notes (Unsigned)
 04/08/2024 2:27 PM   Azel L Minish 02-Jun-1951 [id number]  Referring provider: Bennet Brasil, MD [address] [place],  [place] [address]  No chief complaint on file.   HPI:  F/u -   1) PSA elevation - rising PSA of July 2020 2.9, July 2021 3.6, September 2022 4.3, November 2022 7.2. No h/o BPH. No prior bx. No FH PCa. He had a 50g benign prostate on DRE in 2022. A Dec 2022 pMRI showed a 1.9 cm PIRADS 3 lesion left PZ, prostate 72 grams and negative staging. His Jul 2023 PSA was down to 3.9.   2) ED - he took Viagra . He carries NTG but hasn't taken in years. He carries parts and cut trees.    3) BPH - prostate was 70 g on imaging. He has some PV dribble. NG risk includes CVA in 2022. He has a adequate stream but slow in the morning.  IPSS = 6.   Today, seen for the above. His Mar 2023 PSA 4.6, Feb 2024 PSA 3.5 and Mar 2025 PSA 5.0. No voiding complaints. Some urgency. On tamsulosin . He has ED.    He is on Coumadin  for afib and CAD. This is managed by Dr. Fairy Homer.    He retired from [employer] and now [employer] part time.   PMH: Past Medical History:  Diagnosis Date   Arthritis    Atrial fibrillation (HCC)    CAD (coronary artery disease)    70% distal circumflex 11/2014    Dysrhythmia    AFib   GERD (gastroesophageal reflux disease)    Nonischemic cardiomyopathy (HCC)    Tobacco abuse    Snuff    Surgical History: Past Surgical History:  Procedure Laterality Date   APPENDECTOMY     BIOPSY  05/22/2023   Procedure: BIOPSY;  Surgeon: Vinetta Greening, DO;  [facility];  Service: Endoscopy;;   CATARACT EXTRACTION W/PHACO Left 11/18/2019   Procedure: CATARACT EXTRACTION PHACO AND INTRAOCULAR LENS PLACEMENT (IOC);  Surgeon: Tarri Farm, MD;  [facility]: [facility];  Service: Ophthalmology;  Laterality: Left;  CDE: 9.66   CATARACT EXTRACTION W/PHACO Right 12/04/2019   Procedure: CATARACT EXTRACTION PHACO AND INTRAOCULAR  LENS PLACEMENT RIGHT EYE (CDE: 4.56);  Surgeon: Tarri Farm, MD;  [facility]: [facility];  Service: Ophthalmology;  Laterality: Right;   COLONOSCOPY  2005   Neg   COLONOSCOPY N/A 01/05/2015   SLF:17 colon polyps removed/moderate sized hemorrhoids   COLONOSCOPY WITH PROPOFOL  N/A 06/19/2018   Procedure: COLONOSCOPY WITH PROPOFOL ;  Surgeon: Alyce Jubilee, MD;  [facility];  Service: Endoscopy;  Laterality: N/A;  7:30am   COLONOSCOPY WITH PROPOFOL  N/A 05/22/2023   Procedure: COLONOSCOPY WITH PROPOFOL ;  Surgeon: Vinetta Greening, DO;  [facility];  Service: Endoscopy;  Laterality: N/A;  12:30 pm, asa 3   ESOPHAGOGASTRODUODENOSCOPY N/A 01/05/2015   [id number] at gastro junction/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/small HH/mild erosive gastrtis and duodenitis   ESOPHAGOGASTRODUODENOSCOPY (EGD) WITH PROPOFOL  N/A 06/19/2018   Procedure: ESOPHAGOGASTRODUODENOSCOPY (EGD) WITH PROPOFOL ;  Surgeon: Alyce Jubilee, MD;  [facility];  Service: Endoscopy;  Laterality: N/A;   ESOPHAGOGASTRODUODENOSCOPY (EGD) WITH PROPOFOL  N/A 05/22/2023   Procedure: ESOPHAGOGASTRODUODENOSCOPY (EGD) WITH PROPOFOL ;  Surgeon: Vinetta Greening, DO;  [facility];  Service: Endoscopy;  Laterality: N/A;   HEMORRHOID BANDING N/A 01/05/2015   Procedure: HEMORRHOID BANDING;  Surgeon: Alyce Jubilee, MD;  [facility];  Service: Endoscopy;  Laterality: N/A;  LEFT AND RIGHT HEART CATHETERIZATION WITH CORONARY ANGIOGRAM N/A 12/03/2014   Procedure: LEFT AND RIGHT HEART CATHETERIZATION WITH CORONARY ANGIOGRAM;  Surgeon: Arlander Bellman, MD;  Location: [facility] CATH LAB;  Service: Cardiovascular;  Laterality: N/A;   POLYPECTOMY  06/19/2018   Procedure: POLYPECTOMY;  Surgeon: Alyce Jubilee, MD;  [facility];  Service: Endoscopy;;  rectal    POLYPECTOMY  05/22/2023   Procedure: POLYPECTOMY;  Surgeon: Vinetta Greening, DO;  [facility];  Service: Endoscopy;;   SAVORY DILATION   06/19/2018   Procedure: SAVORY DILATION;  Surgeon: Alyce Jubilee, MD;  [facility];  Service: Endoscopy;;   TONSILLECTOMY      Home Medications:  Allergies as of 04/08/2024       Reactions   Reglan  [metoclopramide ] Anaphylaxis        Medication List        Accurate as of Apr 08, 2024  2:27 PM. If you have any questions, ask your nurse or doctor.          amoxicillin -clavulanate 875-125 MG tablet Commonly known as: AUGMENTIN  Take 1 tablet by mouth 2 (two) times daily.   atorvastatin  40 MG tablet Commonly known as: LIPITOR  Take 1 tablet by mouth once daily   calcium  carbonate 500 MG chewable tablet Commonly known as: TUMS - dosed in mg elemental calcium  Chew 2-3 tablets by mouth as needed for indigestion or heartburn.   carvedilol  3.125 MG tablet Commonly known as: COREG  Take 1 tablet (3.125 mg total) by mouth 2 (two) times daily.   fluticasone  50 MCG/ACT nasal spray Commonly known as: Flonase  Place 2 sprays into both nostrils daily.   furosemide  20 MG tablet Commonly known as: LASIX  Take 1 tablet (20 mg total) by mouth daily.   losartan  25 MG tablet Commonly known as: COZAAR  Take 1 tablet by mouth once daily   nitroGLYCERIN  0.4 MG SL tablet Commonly known as: NITROSTAT  DISSOLVE ONE TABLET UNDER THE TONGUE EVERY 5 MINUTES AS NEEDED FOR CHEST PAIN.  DO NOT EXCEED A TOTAL OF 3 DOSES IN 15 MINUTES. CALL 911.   omeprazole  20 MG capsule Commonly known as: PRILOSEC Take 1 capsule by mouth once daily   oxyCODONE  5 MG immediate release tablet Commonly known as: Roxicodone  Take 1 tablet (5 mg total) by mouth every 6 (six) hours as needed for breakthrough pain.   oxymetazoline 0.05 % nasal spray Commonly known as: AFRIN Place 1 spray into both nostrils 2 (two) times daily as needed for congestion.   pregabalin  25 MG capsule Commonly known as: LYRICA  TAKE 1 CAPSULE BY MOUTH BEFORE SUPPER AND 1 CAPSULE AT BEDTIME FOR FOOT NERVE PAIN   tamsulosin  0.4  MG Caps capsule Commonly known as: FLOMAX  Take 1 capsule (0.4 mg total) by mouth daily after supper.   topiramate  50 MG tablet Commonly known as: TOPAMAX  Take 1 tablet by mouth twice daily   Vitamin D -3 125 MCG (5000 UT) Tabs Take 5,000 Units by mouth daily.   warfarin 5 MG tablet Commonly known as: COUMADIN  Take as directed by the anticoagulation clinic. If you are unsure how to take this medication, talk to your nurse or doctor. Original instructions: TAKE 1 TO 2 TABLETS BY MOUTH ONCE DAILY BASED  ON  INR        Allergies:  Allergies  Allergen Reactions   Reglan  [Metoclopramide ] Anaphylaxis    Family History: Family History  Problem Relation Age of Onset   Heart attack Father  Hypertension Father    Diabetes Father    Colon cancer Neg Hx    Colon polyps Neg Hx     Social History:  reports that he quit smoking about 51 years ago. His smoking use included cigars and cigarettes. He started smoking about 54 years ago. He has a 0.8 pack-year smoking history. He has quit using smokeless tobacco.  His smokeless tobacco use included snuff. He reports that he does not drink alcohol and does not use drugs.   Physical Exam: There were no vitals taken for this visit.  Constitutional:  Alert and oriented, No acute distress. HEENT: [place] AT, moist mucus membranes.  Trachea midline, no masses. Cardiovascular: No clubbing, cyanosis, or edema. Respiratory: Normal respiratory effort, no increased work of breathing. GI: Abdomen is soft, nontender, nondistended, no abdominal masses GU: No CVA tenderness Skin: No rashes, bruises or suspicious lesions. Neurologic: Grossly intact, no focal deficits, moving all 4 extremities. Psychiatric: Normal mood and affect.  Laboratory Data: Lab Results  Component Value Date   WBC 5.3 02/14/2024   HGB 14.5 02/14/2024   HCT 45.5 02/14/2024   MCV 90 02/14/2024   PLT 199 02/14/2024    Lab Results  Component Value Date   CREATININE 1.01  02/14/2024    Lab Results  Component Value Date   PSA 4.00 11/20/2014   PSA 4.60 (H) 11/13/2014    No results found for: "TESTOSTERONE"  Lab Results  Component Value Date   HGBA1C 6.0 (H) 04/15/2020    Urinalysis    Component Value Date/Time   COLORURINE COLORLESS (A) 04/15/2020 1354   APPEARANCEUR Clear 09/05/2022 1432   LABSPEC 1.019 04/15/2020 1354   PHURINE 7.0 04/15/2020 1354   GLUCOSEU Trace (A) 09/05/2022 1432   HGBUR NEGATIVE 04/15/2020 1354   BILIRUBINUR Negative 09/05/2022 1432   KETONESUR NEGATIVE 04/15/2020 1354   PROTEINUR Negative 09/05/2022 1432   PROTEINUR NEGATIVE 04/15/2020 1354   NITRITE Negative 09/05/2022 1432   NITRITE NEGATIVE 04/15/2020 1354   LEUKOCYTESUR Negative 09/05/2022 1432   LEUKOCYTESUR NEGATIVE 04/15/2020 1354    Lab Results  Component Value Date   LABMICR See below: 09/05/2022   WBCUA None seen 09/05/2022   LABEPIT None seen 09/05/2022   BACTERIA None seen 09/05/2022    Pertinent Imaging: N/a  Assessment & Plan:    1) PSA - remains stable with normal age specific and PSAD.   2) BPH - cont tamsulosin    3) ED - disc nature r/b/a to pde5i, ved or ICI. He will consider. I sent a Rx for tadalafil 10 mg. Discussed ntg warnings.    No follow-ups on file.  Christina Coyer, MD  [facility]  [address] [place], [place] [address] [phone]

## 2024-04-16 ENCOUNTER — Ambulatory Visit

## 2024-04-24 ENCOUNTER — Other Ambulatory Visit: Payer: Self-pay | Admitting: Family Medicine

## 2024-04-25 ENCOUNTER — Other Ambulatory Visit: Payer: Self-pay

## 2024-04-25 MED ORDER — TOPIRAMATE 50 MG PO TABS: 50.0000 mg | ORAL_TABLET | Freq: Two times a day (BID) | ORAL | 1 refills | Status: DC

## 2024-05-03 ENCOUNTER — Ambulatory Visit: Payer: Self-pay | Admitting: Family Medicine

## 2024-05-03 ENCOUNTER — Ambulatory Visit

## 2024-05-03 DIAGNOSIS — Z7901 Long term (current) use of anticoagulants: Secondary | ICD-10-CM | POA: Diagnosis not present

## 2024-05-03 LAB — POCT INR: INR: 2.7 (ref 2.0–3.0)

## 2024-05-06 ENCOUNTER — Other Ambulatory Visit: Payer: Self-pay | Admitting: Family Medicine

## 2024-05-07 ENCOUNTER — Other Ambulatory Visit: Payer: Self-pay

## 2024-05-07 MED ORDER — FUROSEMIDE 20 MG PO TABS: 20.0000 mg | ORAL_TABLET | Freq: Every day | ORAL | 1 refills | Status: DC

## 2024-06-08 ENCOUNTER — Other Ambulatory Visit: Payer: Self-pay | Admitting: Family Medicine

## 2024-06-10 ENCOUNTER — Other Ambulatory Visit: Payer: Self-pay | Admitting: Family Medicine

## 2024-06-10 ENCOUNTER — Encounter: Payer: Self-pay | Admitting: Family Medicine

## 2024-07-02 ENCOUNTER — Ambulatory Visit (INDEPENDENT_AMBULATORY_CARE_PROVIDER_SITE_OTHER)

## 2024-07-02 ENCOUNTER — Ambulatory Visit: Payer: Self-pay | Admitting: Family Medicine

## 2024-07-02 DIAGNOSIS — Z7901 Long term (current) use of anticoagulants: Secondary | ICD-10-CM

## 2024-07-02 LAB — POCT INR: INR: 3.2 — AB (ref 2.0–3.0)

## 2024-07-09 ENCOUNTER — Other Ambulatory Visit: Payer: Self-pay | Admitting: Family Medicine

## 2024-07-10 ENCOUNTER — Other Ambulatory Visit: Payer: Self-pay | Admitting: Family Medicine

## 2024-07-14 ENCOUNTER — Other Ambulatory Visit: Payer: Self-pay | Admitting: Family Medicine

## 2024-07-18 ENCOUNTER — Other Ambulatory Visit: Payer: Self-pay | Admitting: Family Medicine

## 2024-08-02 ENCOUNTER — Ambulatory Visit: Admitting: Nurse Practitioner

## 2024-08-02 ENCOUNTER — Ambulatory Visit

## 2024-08-02 VITALS — BP 135/86 | HR 76 | Temp 97.9°F | Ht 73.0 in | Wt 216.0 lb

## 2024-08-02 DIAGNOSIS — E7849 Other hyperlipidemia: Secondary | ICD-10-CM

## 2024-08-02 DIAGNOSIS — Z79899 Other long term (current) drug therapy: Secondary | ICD-10-CM

## 2024-08-02 DIAGNOSIS — K219 Gastro-esophageal reflux disease without esophagitis: Secondary | ICD-10-CM

## 2024-08-02 DIAGNOSIS — R7301 Impaired fasting glucose: Secondary | ICD-10-CM

## 2024-08-02 DIAGNOSIS — Z7901 Long term (current) use of anticoagulants: Secondary | ICD-10-CM | POA: Diagnosis not present

## 2024-08-02 DIAGNOSIS — S86912A Strain of unspecified muscle(s) and tendon(s) at lower leg level, left leg, initial encounter: Secondary | ICD-10-CM

## 2024-08-02 LAB — POCT INR: INR: 3 (ref 2.0–3.0)

## 2024-08-02 NOTE — Progress Notes (Signed)
 Subjective:    Patient ID: David Dougherty, male    DOB: 1951-02-13, 73 y.o.   MRN: [id number]  HPI Patient is present for his follow up and monitoring of long term anticoagulation medication, no concerns voiced Discussed the use of AI scribe software for clinical note transcription with the patient, who gave verbal consent to proceed.  History of Present Illness David Dougherty Passage is a 73 year old male with hypertension and coronary artery disease who presents for a general checkup and management of chronic health issues.  He recently experienced significant discomfort in the posterior aspect of his knee after moving concrete, suspecting a strain. The pain is described as 'terrible', but he can bear weight and walk, though pressing the area causes pain.  He is on a Coumadin  dosing schedule with a stable INR of 3.0. He does not regularly check his blood pressure at home but has a cuff available. His reflux is well-controlled with daily omeprazole , and he has not needed nitroglycerin  recently but may need a refill. He takes cholesterol medication daily and is compliant with his regimen, including Coreg , Lasix , losartan , and pregabalin  for nerve pain in his foot, which he takes before supper and at bedtime. He also takes Flomax  for urinary symptoms and topiramate  for migraines.  He mentions occasional back pain and has a back pain medicine available, though he does not take it much. He has a history of varicose veins and reports some ankle swelling. Occasional diarrhea is noted, with no blood in stool, attributing occasional stool color changes after Pepto-Bismol use.  He consumes decaffeinated tea, drinks alcohol infrequently, and does not smoke or vape. Recent lab work showed slightly elevated PSA levels, monitored by his urologist. His fasting blood sugar is in the prediabetic range, and triglycerides have increased for the first time in four years, which he attributes to dietary  choices. Takes daily Omeprazole  for GERD which is under good control. 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    Review of Systems  Constitutional:  Negative for activity change and appetite change.  Respiratory:  Negative for cough, chest tightness and shortness of breath.   Cardiovascular:  Negative for chest pain and leg swelling.  Gastrointestinal:  Negative for abdominal pain, blood in stool, constipation and diarrhea.  Musculoskeletal:        Pain behind the left knee with palpation. Ambulates without difficulty. Was lifting a heavy cement block and stretched his knee.       08/02/2024    1:41 PM  Depression screen PHQ 2/9  Decreased Interest 0  Down, Depressed, Hopeless 0  PHQ - 2 Score 0  Altered sleeping 0  Tired, decreased energy 0  Change in appetite 0  Feeling bad or failure about yourself  0  Trouble concentrating 0  Moving slowly or fidgety/restless 0  Suicidal thoughts 0  PHQ-9 Score 0  Difficult doing work/chores Not difficult at all      08/02/2024    1:41 PM 12/30/2022   11:42 AM 11/30/2022   11:31 AM  GAD 7 : Generalized Anxiety Score  Nervous, Anxious, on Edge 0 0 0  Control/stop worrying 0 0 0  Worry too much - different things 0 0 0  Trouble relaxing 0 0 0  Restless 0 0 3  Easily annoyed or irritable 0 0 0  Afraid - awful might happen 0 0 0  Total GAD 7 Score 0 0 3  Anxiety Difficulty Not difficult  at all  Not difficult at all    Social History   Tobacco Use   Smoking status: Former    Current packs/day: 0.00    Average packs/day: 0.3 packs/day for 3.0 years (0.8 ttl pk-yrs)    Types: Cigars, Cigarettes    Start date: 10/19/1969    Quit date: 10/22/1972    Years since quitting: 51.8   Smokeless tobacco: Former    Types: Snuff  Vaping Use   Vaping status: Never Used  Substance Use Topics   Alcohol use: No    Comment: occ   Drug use: No        Objective:   Physical Exam Vitals and nursing note  reviewed.  Constitutional:      General: He is not in acute distress. Cardiovascular:     Rate and Rhythm: Normal rate and regular rhythm.  Pulmonary:     Effort: Pulmonary effort is normal.     Breath sounds: Normal breath sounds.  Abdominal:     General: There is no distension.     Palpations: Abdomen is soft.     Tenderness: There is no abdominal tenderness.  Musculoskeletal:     Comments: Significant varicose veins and venous stasis color changes noted both LE. Trace pitting edema.  Left knee no joint laxity or crepitus.  Localized area of tenderness in popliteal area medial side with palpation.  No edema or mass noted.  Gait normal.  Skin:    General: Skin is warm and dry.  Neurological:     Mental Status: He is alert and oriented to person, place, and time.  Psychiatric:        Mood and Affect: Mood normal.        Behavior: Behavior normal.        Thought Content: Thought content normal.        Judgment: Judgment normal.    Today's Vitals   08/02/24 1338  BP: 135/86  Pulse: 76  Temp: 97.9 F (36.6 C)  SpO2: 98%  Weight: 216 lb (98 kg)  Height: 6' 1 (1.854 m)   Body mass index is 28.5 kg/m.  Results for orders placed or performed in visit on 08/02/24  POCT INR   Collection Time: 08/02/24  1:47 PM  Result Value Ref Range   INR 3.0 2.0 - 3.0   POC INR            Assessment & Plan:   Problem List Items Addressed This Visit       Digestive   GASTROESOPHAGEAL REFLUX DISEASE   Relevant Medications   omeprazole  (PRILOSEC) 20 MG capsule     Other   Elevated fasting glucose   Relevant Orders   Hemoglobin A1c (Completed)   Hyperlipidemia   Relevant Medications   nitroGLYCERIN  (NITROSTAT ) 0.4 MG SL tablet   Other Relevant Orders   Lipid panel (Completed)   Knee strain, left, initial encounter   Other Visit Diagnoses       Encounter for current long-term use of anticoagulants    -  Primary   Relevant Orders   POCT INR (Completed)     High risk  medication use       Relevant Orders   Hepatic function panel (Completed)      Meds ordered this encounter  Medications   nitroGLYCERIN  (NITROSTAT ) 0.4 MG SL tablet    Sig: DISSOLVE ONE TABLET UNDER THE TONGUE EVERY 5 MINUTES AS NEEDED FOR CHEST PAIN.  DO NOT EXCEED A TOTAL OF  3 DOSES IN 15 MINUTES. CALL 911.    Dispense:  25 tablet    Refill:  0    Supervising Provider:   ALPHONSA HAMILTON A [[id number]]   omeprazole  (PRILOSEC) 20 MG capsule    Sig: Take 1 capsule (20 mg total) by mouth daily.    Dispense:  90 capsule    Refill:  0    Supervising Provider:   ALPHONSA HAMILTON A [[id number]]     Assessment and Plan Assessment & Plan Long term anticoagulants -continue current regimen of Coumadin . Recheck INR in one month.    Knee ligament strain Strain of the posterior ligament in the knee, likely due to lifting concrete. Mild swelling and pain localized to the posterior aspect of the knee. No imaging required. - Recommend ice or heat application based on preference. - Advise use of knee support or ACE wrap for stabilization. - Instruct to use topical treatments like Voltaren  gel or Biofreeze for pain relief.   Gastroesophageal reflux disease GERD is well-managed with daily use of omeprazole . - Continue omeprazole  daily. - Advise to avoid dietary triggers such as citrus, tomatoes, and spicy foods.  Hyperlipidemia Recent labs showed elevated triglycerides and LDL cholesterol. He is on cholesterol medication. - Order repeat cholesterol. Confirms taking medication daily. - Advise dietary modifications to reduce fatty food intake.   Elevated fasting glucose Fasting blood sugar in prediabetic range. - Order A1c test to assess average blood sugar levels  Hypertension Blood pressure is well-controlled with current medication regimen. - Continue current antihypertensive medications. - Advise home blood pressure monitoring if feeling unwell.   General Health Maintenance Recommended  multivitamin for men over 50. - Recommend multivitamin for men over 50, preferably chewable. Flu vaccine this fall.   Follow-Up Follow-up with Doctor HAMILTON and urology as per usual schedule.

## 2024-08-03 ENCOUNTER — Encounter: Payer: Self-pay | Admitting: Nurse Practitioner

## 2024-08-03 DIAGNOSIS — R519 Headache, unspecified: Secondary | ICD-10-CM | POA: Insufficient documentation

## 2024-08-03 DIAGNOSIS — S86912A Strain of unspecified muscle(s) and tendon(s) at lower leg level, left leg, initial encounter: Secondary | ICD-10-CM | POA: Insufficient documentation

## 2024-08-03 LAB — HEPATIC FUNCTION PANEL
ALT: 15 IU/L (ref 0–44)
AST: 17 IU/L (ref 0–40)
Albumin: 4.5 g/dL (ref 3.8–4.8)
Alkaline Phosphatase: 81 IU/L (ref 44–121)
Bilirubin Total: 0.5 mg/dL (ref 0.0–1.2)
Bilirubin, Direct: 0.14 mg/dL (ref 0.00–0.40)
Total Protein: 7 g/dL (ref 6.0–8.5)

## 2024-08-03 LAB — LIPID PANEL
Chol/HDL Ratio: 5.9 ratio — ABNORMAL HIGH (ref 0.0–5.0)
Cholesterol, Total: 200 mg/dL — ABNORMAL HIGH (ref 100–199)
HDL: 34 mg/dL — ABNORMAL LOW (ref >39)
LDL Chol Calc (NIH): 139 mg/dL — ABNORMAL HIGH (ref 0–99)
Triglycerides: 146 mg/dL (ref 0–149)
VLDL Cholesterol Cal: 27 mg/dL (ref 5–40)

## 2024-08-03 LAB — HEMOGLOBIN A1C
Est. average glucose Bld gHb Est-mCnc: 134 mg/dL
Hgb A1c MFr Bld: 6.3 % — ABNORMAL HIGH (ref 4.8–5.6)

## 2024-08-03 MED ORDER — NITROGLYCERIN 0.4 MG SL SUBL: SUBLINGUAL_TABLET | SUBLINGUAL | 0 refills | Status: AC

## 2024-08-03 MED ORDER — OMEPRAZOLE 20 MG PO CPDR: 20.0000 mg | DELAYED_RELEASE_CAPSULE | Freq: Every day | ORAL | 0 refills | Status: DC

## 2024-08-06 ENCOUNTER — Ambulatory Visit: Payer: Self-pay | Admitting: Nurse Practitioner

## 2024-08-20 ENCOUNTER — Other Ambulatory Visit: Payer: Self-pay | Admitting: Family Medicine

## 2024-08-29 ENCOUNTER — Ambulatory Visit: Payer: Self-pay | Admitting: Nurse Practitioner

## 2024-08-29 ENCOUNTER — Ambulatory Visit

## 2024-08-29 DIAGNOSIS — Z7901 Long term (current) use of anticoagulants: Secondary | ICD-10-CM

## 2024-08-29 DIAGNOSIS — M25562 Pain in left knee: Secondary | ICD-10-CM | POA: Diagnosis not present

## 2024-08-29 DIAGNOSIS — G8929 Other chronic pain: Secondary | ICD-10-CM

## 2024-08-29 LAB — POCT INR: POC INR: 2.9

## 2024-08-30 ENCOUNTER — Other Ambulatory Visit: Payer: Self-pay | Admitting: Family Medicine

## 2024-08-30 ENCOUNTER — Ambulatory Visit

## 2024-09-10 ENCOUNTER — Ambulatory Visit: Admitting: Orthopedic Surgery

## 2024-09-10 ENCOUNTER — Other Ambulatory Visit (INDEPENDENT_AMBULATORY_CARE_PROVIDER_SITE_OTHER): Payer: Self-pay

## 2024-09-10 VITALS — BP 134/87 | HR 73 | Ht 73.0 in | Wt 209.0 lb

## 2024-09-10 DIAGNOSIS — G8929 Other chronic pain: Secondary | ICD-10-CM | POA: Diagnosis not present

## 2024-09-10 DIAGNOSIS — M1712 Unilateral primary osteoarthritis, left knee: Secondary | ICD-10-CM

## 2024-09-10 DIAGNOSIS — M25562 Pain in left knee: Secondary | ICD-10-CM

## 2024-09-10 NOTE — Progress Notes (Unsigned)
 New Patient Visit  Assessment: David Dougherty is a 73 y.o. male with the following: 1. Chronic pain of left knee; moderate left knee arthritis   Plan: David Dougherty has arthritis in his left knee.  We reviewed the radiographs in clinic today, and I outlined the natural progression.  We had an extensive discussion regarding all potential treatment options, including continuing with the current treatment. NSAIDs are the most appropriate medications, and these are available OTC or via prescription.  I have urged them to remain active, and they can continue with activities on their own, or we can refer them to physical therapy.  We can also consider a brace, or compression sleeve. If the pain is severe enough, we can consider a steroid injection.  If their knee pain is affecting their everyday activities, including sleep, knee replacement is a consideration, but we would have to refer them to see my partner Dr. Margrette.  After discussing all of these options, the patient has elected to proceed with a steroid injection     Procedure note injection Left knee joint   Verbal consent was obtained to inject the left knee joint  Timeout was completed to confirm the site of injection.  The skin was prepped with alcohol and ethyl chloride was sprayed at the injection site.  A 21-gauge needle was used to inject 40 mg of Depo-Medrol  and 1% lidocaine  (4 cc) into the left knee using an anterolateral approach.  There were no complications. A sterile bandage was applied.    Follow-up: Return if symptoms worsen or fail to improve.  Subjective:  Chief Complaint  Patient presents with   Knee Pain    L for yrs but worse over the past mo. Pt states he felt a pain in the back of the knee after lifting concrete.     History of Present Illness: David Dougherty is a 73 y.o. male who has been referred by Glendia Fielding, MD for evaluation of left knee pain.  He states has had pain in the left knee for  years.  It is progressively worsening.  It has recently worsened after lifting some concrete.  No specific injury.  He has not had an injection.  He has not worked with physical therapy.  He is planning a family vacation, and is interested in an injection if this could potentially help.   Review of Systems: No fevers or chills No numbness or tingling No chest pain No shortness of breath No bowel or bladder dysfunction No GI distress No headaches   Medical History:  Past Medical History:  Diagnosis Date   Arthritis    Atrial fibrillation (HCC)    CAD (coronary artery disease)    70% distal circumflex 11/2014    Dysrhythmia    AFib   GERD (gastroesophageal reflux disease)    Nonischemic cardiomyopathy (HCC)    Tobacco abuse    Snuff    Past Surgical History:  Procedure Laterality Date   APPENDECTOMY     BIOPSY  05/22/2023   Procedure: BIOPSY;  Surgeon: Cindie Carlin POUR, DO;  Location: [facility];  Service: Endoscopy;;   CATARACT EXTRACTION W/PHACO Left 11/18/2019   Procedure: CATARACT EXTRACTION PHACO AND INTRAOCULAR LENS PLACEMENT (IOC);  Surgeon: Harrie Agent, MD;  Location: [facility];  Service: Ophthalmology;  Laterality: Left;  CDE: 9.66   CATARACT EXTRACTION W/PHACO Right 12/04/2019   Procedure: CATARACT EXTRACTION PHACO AND INTRAOCULAR LENS PLACEMENT RIGHT EYE (CDE: 4.56);  Surgeon: Harrie Agent, MD;  Location: [facility];  Service: Ophthalmology;  Laterality: Right;   COLONOSCOPY  2005   Neg   COLONOSCOPY N/A 01/05/2015   SLF:17 colon polyps removed/moderate sized hemorrhoids   COLONOSCOPY WITH PROPOFOL  N/A 06/19/2018   Procedure: COLONOSCOPY WITH PROPOFOL ;  Surgeon: Harvey Margo CROME, MD;  Location: [facility];  Service: Endoscopy;  Laterality: N/A;  7:30am   COLONOSCOPY WITH PROPOFOL  N/A 05/22/2023   Procedure: COLONOSCOPY WITH PROPOFOL ;  Surgeon: Cindie Carlin POUR, DO;  Location: [facility];  Service: Endoscopy;  Laterality: N/A;  12:30 pm, asa 3    ESOPHAGOGASTRODUODENOSCOPY N/A 01/05/2015   DOQ:dumprulmz at gastro junction/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/small HH/mild erosive gastrtis and duodenitis   ESOPHAGOGASTRODUODENOSCOPY (EGD) WITH PROPOFOL  N/A 06/19/2018   Procedure: ESOPHAGOGASTRODUODENOSCOPY (EGD) WITH PROPOFOL ;  Surgeon: Harvey Margo CROME, MD;  Location: [facility];  Service: Endoscopy;  Laterality: N/A;   ESOPHAGOGASTRODUODENOSCOPY (EGD) WITH PROPOFOL  N/A 05/22/2023   Procedure: ESOPHAGOGASTRODUODENOSCOPY (EGD) WITH PROPOFOL ;  Surgeon: Cindie Carlin POUR, DO;  Location: [facility];  Service: Endoscopy;  Laterality: N/A;   HEMORRHOID BANDING N/A 01/05/2015   Procedure: HEMORRHOID BANDING;  Surgeon: Margo CROME Harvey, MD;  Location: [facility];  Service: Endoscopy;  Laterality: N/A;   LEFT AND RIGHT HEART CATHETERIZATION WITH CORONARY ANGIOGRAM N/A 12/03/2014   Procedure: LEFT AND RIGHT HEART CATHETERIZATION WITH CORONARY ANGIOGRAM;  Surgeon: Ozell JONETTA Fell, MD;  [facility];  Service: Cardiovascular;  Laterality: N/A;   POLYPECTOMY  06/19/2018   Procedure: POLYPECTOMY;  Surgeon: Harvey Margo CROME, MD;  Location: [facility];  Service: Endoscopy;;  rectal    POLYPECTOMY  05/22/2023   Procedure: POLYPECTOMY;  Surgeon: Cindie Carlin POUR, DO;  Location: [facility];  Service: Endoscopy;;   SAVORY DILATION  06/19/2018   Procedure: SAVORY DILATION;  Surgeon: Harvey Margo CROME, MD;  Location: [facility];  Service: Endoscopy;;   TONSILLECTOMY      Family History  Problem Relation Age of Onset   Heart attack Father    Hypertension Father    Diabetes Father    Colon cancer Neg Hx    Colon polyps Neg Hx    Social History   Tobacco Use   Smoking status: Former    Current packs/day: 0.00    Average packs/day: 0.3 packs/day for 3.0 years (0.8 ttl pk-yrs)    Types: Cigars, Cigarettes    Start date: 10/19/1969    Quit date: 10/22/1972    Years since quitting: 51.9   Smokeless tobacco: Former    Types: Snuff  Vaping Use    Vaping status: Never Used  Substance Use Topics   Alcohol use: No    Comment: occ   Drug use: No    Allergies  Allergen Reactions   Reglan  [Metoclopramide ] Anaphylaxis    Current Meds  Medication Sig   atorvastatin  (LIPITOR ) 40 MG tablet Take 1 tablet by mouth once daily   calcium  carbonate (TUMS - DOSED IN MG ELEMENTAL CALCIUM ) 500 MG chewable tablet Chew 2-3 tablets by mouth as needed for indigestion or heartburn.    carvedilol  (COREG ) 3.125 MG tablet Take 1 tablet (3.125 mg total) by mouth 2 (two) times daily.   Cholecalciferol  (VITAMIN D -3) 125 MCG (5000 UT) TABS Take 5,000 Units by mouth daily.   fluticasone  (FLONASE ) 50 MCG/ACT nasal spray Place 2 sprays into both nostrils daily.   furosemide  (LASIX ) 20 MG tablet Take 1 tablet (20 mg total) by mouth daily.   losartan  (COZAAR ) 25 MG tablet Take 1  tablet by mouth once daily   nitroGLYCERIN  (NITROSTAT ) 0.4 MG SL tablet DISSOLVE ONE TABLET UNDER THE TONGUE EVERY 5 MINUTES AS NEEDED FOR CHEST PAIN.  DO NOT EXCEED A TOTAL OF 3 DOSES IN 15 MINUTES. CALL 911.   omeprazole  (PRILOSEC) 20 MG capsule Take 1 capsule (20 mg total) by mouth daily.   oxyCODONE  (ROXICODONE ) 5 MG immediate release tablet Take 1 tablet (5 mg total) by mouth every 6 (six) hours as needed for breakthrough pain.   oxymetazoline (AFRIN) 0.05 % nasal spray Place 1 spray into both nostrils 2 (two) times daily as needed for congestion.   pregabalin  (LYRICA ) 25 MG capsule TAKE 1 CAPSULE BY MOUTH BEFORE SUPPER AND 1 CAPSULE AT BEDTIME FOR FOOT NERVE PAIN   tadalafil  (CIALIS ) 10 MG tablet Take 1 tablet (10 mg total) by mouth daily as needed for erectile dysfunction.   tamsulosin  (FLOMAX ) 0.4 MG CAPS capsule Take 1 capsule (0.4 mg total) by mouth daily after supper.   topiramate  (TOPAMAX ) 50 MG tablet Take 1 tablet (50 mg total) by mouth 2 (two) times daily.   warfarin (COUMADIN ) 5 MG tablet TAKE 1 TO 2 TABLETS BY MOUTH ONCE DAILY BASED ON INR    Objective: BP 134/87    Pulse 73   Ht 6' 1 (1.854 m)   Wt 209 lb (94.8 kg)   BMI 27.57 kg/m   Physical Exam:  General: Alert and oriented. and No acute distress. Gait: Left sided antalgic gait.  Left knee with mild varus alignment.  Mild deformity.  Tenderness to palpation along the medial joint line.  He has good range of motion.  Mild tenderness to palpation of the posterior knee.  Negative Lachman.  No increased laxity varus or valgus stress.  IMAGING: I personally ordered and reviewed the following images  Left knee x-rays were obtained in clinic today.  No acute injuries.  Complete loss of joint space within the medial compartment.  Within the patellofemoral compartment, there is some loss of joint space, with a spike shaped osteophyte present.  No bony lesions.  Impression: Left knee x-rays with moderate arthritis    New Medications:  No orders of the defined types were placed in this encounter.     Oneil DELENA Horde, MD  09/12/2024 2:12 PM

## 2024-09-10 NOTE — Patient Instructions (Signed)

## 2024-09-12 ENCOUNTER — Encounter: Payer: Self-pay | Admitting: Orthopedic Surgery

## 2024-09-23 ENCOUNTER — Other Ambulatory Visit: Payer: Self-pay | Admitting: Family Medicine

## 2024-09-26 ENCOUNTER — Encounter: Payer: Self-pay | Admitting: Family Medicine

## 2024-09-26 ENCOUNTER — Other Ambulatory Visit: Payer: Self-pay | Admitting: *Deleted

## 2024-09-26 ENCOUNTER — Ambulatory Visit

## 2024-09-26 DIAGNOSIS — Z7901 Long term (current) use of anticoagulants: Secondary | ICD-10-CM

## 2024-09-27 LAB — PROTIME-INR
INR: 2.6 — ABNORMAL HIGH (ref 0.9–1.2)
Prothrombin Time: 26.7 s — ABNORMAL HIGH (ref 9.1–12.0)

## 2024-09-29 ENCOUNTER — Ambulatory Visit: Payer: Self-pay | Admitting: Family Medicine

## 2024-10-21 ENCOUNTER — Encounter: Payer: Self-pay | Admitting: Urology

## 2024-10-21 ENCOUNTER — Ambulatory Visit: Admitting: Urology

## 2024-10-21 VITALS — BP 112/76 | HR 75

## 2024-10-21 DIAGNOSIS — N400 Enlarged prostate without lower urinary tract symptoms: Secondary | ICD-10-CM

## 2024-10-21 DIAGNOSIS — N138 Other obstructive and reflux uropathy: Secondary | ICD-10-CM

## 2024-10-21 DIAGNOSIS — R972 Elevated prostate specific antigen [PSA]: Secondary | ICD-10-CM | POA: Diagnosis not present

## 2024-10-21 DIAGNOSIS — N5203 Combined arterial insufficiency and corporo-venous occlusive erectile dysfunction: Secondary | ICD-10-CM

## 2024-10-21 DIAGNOSIS — N529 Male erectile dysfunction, unspecified: Secondary | ICD-10-CM | POA: Diagnosis not present

## 2024-10-21 DIAGNOSIS — R3912 Poor urinary stream: Secondary | ICD-10-CM

## 2024-10-21 LAB — URINALYSIS, ROUTINE W REFLEX MICROSCOPIC
Bilirubin, UA: NEGATIVE
Glucose, UA: NEGATIVE
Ketones, UA: NEGATIVE
Leukocytes,UA: NEGATIVE
Nitrite, UA: NEGATIVE
Protein,UA: NEGATIVE
RBC, UA: NEGATIVE
Specific Gravity, UA: 1.015 (ref 1.005–1.030)
Urobilinogen, Ur: 0.2 mg/dL (ref 0.2–1.0)
pH, UA: 6.5 (ref 5.0–7.5)

## 2024-10-21 MED ORDER — SILDENAFIL CITRATE 20 MG PO TABS: ORAL_TABLET | ORAL | 11 refills | Status: AC

## 2024-10-21 NOTE — Progress Notes (Unsigned)
 10/21/2024 2:09 PM   David Dougherty 1951-12-03 [id number]  Referring provider: Alphonsa Glendia LABOR, MD [address] [place],  [place] [address]  No chief complaint on file.   HPI:  F/u -   1) PSA elevation - rising PSA of July 2020 2.9, July 2021 3.6, September 2022 4.3, November 2022 7.2. No h/o BPH. No prior bx. No FH PCa. He had a 50g benign prostate on DRE in 2022. A Dec 2022 pMRI showed a 1.9 cm PIRADS 3 lesion left PZ, prostate 72 grams and negative staging. His Jul 2023 PSA was down to 3.9. His Mar 2023 PSA 4.6, Feb 2024 PSA 3.5 and Mar 2025 PSA 5.0.    2) ED - he took Viagra . He carries NTG but hasn't taken in years. He carries parts and cut trees.    3) BPH - prostate was 70 g on imaging. On tamsulosin . He has some PV dribble. NG risk includes CVA in 2022. He has a adequate stream but slow in the morning.  IPSS = 6.    Today, seen for the above. May 2025 Rx tadalafil  10 mg. He took one tab (10 mg) and then two tabs (20 mg) and no erection. Got some fullness. On tamsulosin . Due for PSA.    He is on Coumadin  for afib and CAD. This is managed by Dr. Alphonsa.    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   PMH: Past Medical History:  Diagnosis Date   Arthritis    Atrial fibrillation (HCC)    CAD (coronary artery disease)    70% distal circumflex 11/2014    Dysrhythmia    AFib   GERD (gastroesophageal reflux disease)    Nonischemic cardiomyopathy (HCC)    Tobacco abuse    Snuff    Surgical History: Past Surgical History:  Procedure Laterality Date   APPENDECTOMY     BIOPSY  05/22/2023   Procedure: BIOPSY;  Surgeon: Cindie Carlin POUR, DO;  [facility];  Service: Endoscopy;;   CATARACT EXTRACTION W/PHACO Left 11/18/2019   Procedure: CATARACT EXTRACTION PHACO AND INTRAOCULAR LENS PLACEMENT (IOC);  Surgeon: Harrie Agent, MD;  Location: [facility];  Service: Ophthalmology;  Laterality: Left;  CDE: 9.66    CATARACT EXTRACTION W/PHACO Right 12/04/2019   Procedure: CATARACT EXTRACTION PHACO AND INTRAOCULAR LENS PLACEMENT RIGHT EYE (CDE: 4.56);  Surgeon: Harrie Agent, MD;  Location: [facility];  Service: Ophthalmology;  Laterality: Right;   COLONOSCOPY  2005   Neg   COLONOSCOPY N/A 01/05/2015   SLF:17 colon polyps removed/moderate sized hemorrhoids   COLONOSCOPY WITH PROPOFOL  N/A 06/19/2018   Procedure: COLONOSCOPY WITH PROPOFOL ;  Surgeon: Harvey Margo CROME, MD;  [facility];  Service: Endoscopy;  Laterality: N/A;  7:30am   COLONOSCOPY WITH PROPOFOL  N/A 05/22/2023   Procedure: COLONOSCOPY WITH PROPOFOL ;  Surgeon: Cindie Carlin POUR, DO;  [facility];  Service: Endoscopy;  Laterality: N/A;  12:30 pm, asa 3   ESOPHAGOGASTRODUODENOSCOPY N/A 01/05/2015   DOQ:dumprulmz at gastro junction/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/small HH/mild erosive gastrtis and duodenitis   ESOPHAGOGASTRODUODENOSCOPY (EGD) WITH PROPOFOL  N/A 06/19/2018   Procedure: ESOPHAGOGASTRODUODENOSCOPY (EGD) WITH PROPOFOL ;  Surgeon: Harvey Margo CROME, MD;  [facility];  Service: Endoscopy;  Laterality: N/A;   ESOPHAGOGASTRODUODENOSCOPY (EGD) WITH PROPOFOL  N/A 05/22/2023   Procedure: ESOPHAGOGASTRODUODENOSCOPY (EGD) WITH PROPOFOL ;  Surgeon: Cindie Carlin POUR, DO;  [facility];  Service: Endoscopy;  Laterality: N/A;   HEMORRHOID BANDING N/A  01/05/2015   Procedure: HEMORRHOID BANDING;  Surgeon: Margo LITTIE Haddock, MD;  [facility];  Service: Endoscopy;  Laterality: N/A;   LEFT AND RIGHT HEART CATHETERIZATION WITH CORONARY ANGIOGRAM N/A 12/03/2014   Procedure: LEFT AND RIGHT HEART CATHETERIZATION WITH CORONARY ANGIOGRAM;  Surgeon: Ozell JONETTA Fell, MD;  Location: [facility];  Service: Cardiovascular;  Laterality: N/A;   POLYPECTOMY  06/19/2018   Procedure: POLYPECTOMY;  Surgeon: Haddock Margo LITTIE, MD;  [facility];  Service: Endoscopy;;  rectal    POLYPECTOMY  05/22/2023   Procedure: POLYPECTOMY;   Surgeon: Cindie Carlin POUR, DO;  [facility];  Service: Endoscopy;;   SAVORY DILATION  06/19/2018   Procedure: SAVORY DILATION;  Surgeon: Haddock Margo LITTIE, MD;  [facility];  Service: Endoscopy;;   TONSILLECTOMY      Home Medications:  Allergies as of 10/21/2024       Reactions   Reglan  [metoclopramide ] Anaphylaxis        Medication List        Accurate as of October 21, 2024  2:09 PM. If you have any questions, ask your nurse or doctor.          atorvastatin  40 MG tablet Commonly known as: LIPITOR  Take 1 tablet by mouth once daily   calcium  carbonate 500 MG chewable tablet Commonly known as: TUMS - dosed in mg elemental calcium  Chew 2-3 tablets by mouth as needed for indigestion or heartburn.   carvedilol  3.125 MG tablet Commonly known as: COREG  Take 1 tablet by mouth twice daily   fluticasone  50 MCG/ACT nasal spray Commonly known as: Flonase  Place 2 sprays into both nostrils daily.   furosemide  20 MG tablet Commonly known as: LASIX  Take 1 tablet (20 mg total) by mouth daily.   losartan  25 MG tablet Commonly known as: COZAAR  Take 1 tablet by mouth once daily   nitroGLYCERIN  0.4 MG SL tablet Commonly known as: NITROSTAT  DISSOLVE ONE TABLET UNDER THE TONGUE EVERY 5 MINUTES AS NEEDED FOR CHEST PAIN.  DO NOT EXCEED A TOTAL OF 3 DOSES IN 15 MINUTES. CALL 911.   omeprazole  20 MG capsule Commonly known as: PRILOSEC Take 1 capsule (20 mg total) by mouth daily.   oxyCODONE  5 MG immediate release tablet Commonly known as: Roxicodone  Take 1 tablet (5 mg total) by mouth every 6 (six) hours as needed for breakthrough pain.   oxymetazoline 0.05 % nasal spray Commonly known as: AFRIN Place 1 spray into both nostrils 2 (two) times daily as needed for congestion.   pregabalin  25 MG capsule Commonly known as: LYRICA  TAKE 1 CAPSULE BY MOUTH BEFORE SUPPER AND 1 CAPSULE AT BEDTIME FOR FOOT NERVE PAIN   tadalafil  10 MG tablet Commonly known as:  CIALIS  Take 1 tablet (10 mg total) by mouth daily as needed for erectile dysfunction.   tamsulosin  0.4 MG Caps capsule Commonly known as: FLOMAX  Take 1 capsule (0.4 mg total) by mouth daily after supper.   topiramate  50 MG tablet Commonly known as: TOPAMAX  Take 1 tablet (50 mg total) by mouth 2 (two) times daily.   Vitamin D -3 125 MCG (5000 UT) Tabs Take 5,000 Units by mouth daily.   warfarin 5 MG tablet Commonly known as: COUMADIN  Take as directed by the anticoagulation clinic. If you are unsure how to take this medication, talk to your nurse or doctor. Original instructions: TAKE 1 TO 2 TABLETS BY MOUTH ONCE DAILY BASED ON INR        Allergies:  Allergies  Allergen Reactions   Reglan  [Metoclopramide ] Anaphylaxis    Family History: Family History  Problem Relation Age of Onset   Heart attack Father    Hypertension Father    Diabetes Father    Colon cancer Neg Hx    Colon polyps Neg Hx     Social History:  reports that he quit smoking about 52 years ago. His smoking use included cigars and cigarettes. He started smoking about 55 years ago. He has a 0.8 pack-year smoking history. He has quit using smokeless tobacco.  His smokeless tobacco use included snuff. He reports that he does not drink alcohol and does not use drugs.   Physical Exam: There were no vitals taken for this visit.  Constitutional:  Alert and oriented, No acute distress. HEENT: [place] AT, moist mucus membranes.  Trachea midline, no masses. Cardiovascular: No clubbing, cyanosis, or edema. Respiratory: Normal respiratory effort, no increased work of breathing. GI: Abdomen is soft, nontender, nondistended, no abdominal masses GU: No CVA tenderness Skin: No rashes, bruises or suspicious lesions. Neurologic: Grossly intact, no focal deficits, moving all 4 extremities. Psychiatric: Normal mood and affect.  Laboratory Data: Lab Results  Component Value Date   WBC 5.3 02/14/2024   HGB 14.5 02/14/2024    HCT 45.5 02/14/2024   MCV 90 02/14/2024   PLT 199 02/14/2024    Lab Results  Component Value Date   CREATININE 1.01 02/14/2024    Lab Results  Component Value Date   PSA 4.00 11/20/2014   PSA 4.60 (H) 11/13/2014    No results found for: TESTOSTERONE  Lab Results  Component Value Date   HGBA1C 6.3 (H) 08/02/2024    Urinalysis    Component Value Date/Time   COLORURINE COLORLESS (A) 04/15/2020 1354   APPEARANCEUR Clear 04/08/2024 1412   LABSPEC 1.019 04/15/2020 1354   PHURINE 7.0 04/15/2020 1354   GLUCOSEU Negative 04/08/2024 1412   HGBUR NEGATIVE 04/15/2020 1354   BILIRUBINUR Negative 04/08/2024 1412   KETONESUR NEGATIVE 04/15/2020 1354   PROTEINUR Trace 04/08/2024 1412   PROTEINUR NEGATIVE 04/15/2020 1354   NITRITE Negative 04/08/2024 1412   NITRITE NEGATIVE 04/15/2020 1354   LEUKOCYTESUR Negative 04/08/2024 1412   LEUKOCYTESUR NEGATIVE 04/15/2020 1354    Lab Results  Component Value Date   LABMICR Comment 04/08/2024   WBCUA None seen 09/05/2022   LABEPIT None seen 09/05/2022   BACTERIA None seen 09/05/2022    Pertinent Imaging: N/a  Assessment & Plan:    PSA - PSA was sent. Consider repeat MRI. Discussed biopsy/fusion biopsy if needed.   BPH - cont tamsulosin    ED - pde5i - tolerated well and again discussed NG combination. Also discussed ICI.     No follow-ups on file.  Donnice Brooks, MD  [facility]  [address] [place], [place] [address] [phone]

## 2024-10-22 ENCOUNTER — Ambulatory Visit: Payer: Self-pay

## 2024-10-22 LAB — PSA: Prostate Specific Ag, Serum: 5.4 ng/mL — ABNORMAL HIGH (ref 0.0–4.0)

## 2024-10-25 ENCOUNTER — Other Ambulatory Visit: Payer: Self-pay | Admitting: Family Medicine

## 2024-10-31 ENCOUNTER — Other Ambulatory Visit: Payer: Self-pay | Admitting: Family Medicine

## 2024-11-04 ENCOUNTER — Ambulatory Visit: Admitting: Family Medicine

## 2024-11-04 VITALS — BP 135/88 | Ht 73.0 in | Wt 209.0 lb

## 2024-11-04 DIAGNOSIS — E7849 Other hyperlipidemia: Secondary | ICD-10-CM | POA: Diagnosis not present

## 2024-11-04 DIAGNOSIS — I110 Hypertensive heart disease with heart failure: Secondary | ICD-10-CM | POA: Insufficient documentation

## 2024-11-04 DIAGNOSIS — I4811 Longstanding persistent atrial fibrillation: Secondary | ICD-10-CM | POA: Diagnosis not present

## 2024-11-04 MED ORDER — WARFARIN SODIUM 5 MG PO TABS: ORAL_TABLET | ORAL | 5 refills | Status: AC

## 2024-11-04 MED ORDER — PRAVASTATIN SODIUM 20 MG PO TABS: 20.0000 mg | ORAL_TABLET | Freq: Every day | ORAL | 1 refills | Status: AC

## 2024-11-04 MED ORDER — TOPIRAMATE 50 MG PO TABS: 50.0000 mg | ORAL_TABLET | Freq: Two times a day (BID) | ORAL | 0 refills | Status: AC

## 2024-11-04 MED ORDER — CARVEDILOL 3.125 MG PO TABS: 3.1250 mg | ORAL_TABLET | Freq: Two times a day (BID) | ORAL | 1 refills | Status: AC

## 2024-11-04 MED ORDER — FUROSEMIDE 20 MG PO TABS: 20.0000 mg | ORAL_TABLET | Freq: Every day | ORAL | 1 refills | Status: DC

## 2024-11-04 NOTE — Progress Notes (Signed)
   Subjective:    Patient ID: David Dougherty, male    DOB: October 28, 1951, 73 y.o.   MRN: [id number]  HPI Patient with underlying history of heart failure Is on multiple medicines Also has atrial fibrillation On Coumadin  Tolerating treatments well Denies any bleeding issues We will set him up for next week for outpatient INR He does not want to get it drawn through our lab currently    Review of Systems     Objective:   Physical Exam  General-in no acute distress Eyes-no discharge Lungs-respiratory rate normal, CTA CV-no murmur heart rate controlled Extremities skin warm dry no edema Neuro grossly normal Behavior normal, alert       Assessment & Plan:   1. Other hyperlipidemia (Primary) Switch to pravastatin 20 he states the other medicine was causing severe muscle pains  2. Hypertensive heart disease with heart failure (HCC) Under reasonable control currently with medication  3. Longstanding persistent atrial fibrillation (HCC) Coumadin  on going INR next week Follow-up in 6 months INR monthly

## 2024-11-11 ENCOUNTER — Other Ambulatory Visit: Payer: Self-pay | Admitting: Nurse Practitioner

## 2024-11-12 ENCOUNTER — Ambulatory Visit

## 2024-11-12 DIAGNOSIS — Z7901 Long term (current) use of anticoagulants: Secondary | ICD-10-CM

## 2024-11-12 LAB — POCT INR: POC INR: 2.7

## 2024-11-18 ENCOUNTER — Other Ambulatory Visit: Payer: Self-pay | Admitting: Family Medicine

## 2024-11-18 ENCOUNTER — Other Ambulatory Visit: Payer: Self-pay | Admitting: Nurse Practitioner

## 2024-12-13 ENCOUNTER — Ambulatory Visit

## 2024-12-13 ENCOUNTER — Ambulatory Visit: Payer: Self-pay | Admitting: Family Medicine

## 2024-12-13 DIAGNOSIS — Z7901 Long term (current) use of anticoagulants: Secondary | ICD-10-CM

## 2024-12-13 LAB — POCT INR: POC INR: 3.4

## 2024-12-17 ENCOUNTER — Encounter: Payer: Self-pay | Admitting: Urology

## 2024-12-17 ENCOUNTER — Encounter: Payer: Self-pay | Admitting: Family Medicine

## 2024-12-17 ENCOUNTER — Telehealth: Payer: Self-pay

## 2024-12-17 DIAGNOSIS — R310 Gross hematuria: Secondary | ICD-10-CM

## 2024-12-17 NOTE — Telephone Encounter (Signed)
 Patient has blood come out of penis after urination for past 2 days.   No other symptoms.  Please advise.  Call:  [phone]

## 2024-12-17 NOTE — Telephone Encounter (Signed)
 Hematuria Patient called with hematuria: 2 days . Symptoms: hematuria Color: Dark yellow 9blood comes then pee then more blood) Frequency: No Symptom onset:2-3 days Blood thinners:yes Name of medication:warfarin  Associated Signs/Symptoms:  Pain no Fever and chills:no  Nausea, vomiting:no nausea and no vomiting Burning with urination: No Any Recent Urologic Surgeries or Procedures: No (recently started during exercise on shaking machine  Any History Of: Stones: no Bladder tumor: no Recurrent UTI's: no  Plan: will forward message to MD to see how he would like to follow up   Advice given:

## 2024-12-18 ENCOUNTER — Ambulatory Visit: Payer: Self-pay | Admitting: Family Medicine

## 2024-12-18 ENCOUNTER — Ambulatory Visit: Admitting: Family Medicine

## 2024-12-18 ENCOUNTER — Other Ambulatory Visit: Payer: Self-pay | Admitting: Family Medicine

## 2024-12-18 ENCOUNTER — Telehealth: Payer: Self-pay

## 2024-12-18 VITALS — BP 165/97 | HR 85 | Temp 98.1°F | Ht 73.0 in | Wt 213.2 lb

## 2024-12-18 DIAGNOSIS — Z7901 Long term (current) use of anticoagulants: Secondary | ICD-10-CM

## 2024-12-18 DIAGNOSIS — R319 Hematuria, unspecified: Secondary | ICD-10-CM

## 2024-12-18 LAB — POCT URINALYSIS DIPSTICK
Bilirubin, UA: 2
Blood, UA: 250
Glucose, UA: POSITIVE — AB
Ketones, UA: NEGATIVE
Leukocytes, UA: NEGATIVE
Nitrite, UA: NEGATIVE
Protein, UA: POSITIVE — AB
Spec Grav, UA: 1.025
Urobilinogen, UA: NEGATIVE U/dL — AB
pH, UA: 5

## 2024-12-18 LAB — POCT INR: POC INR: 3.6

## 2024-12-18 NOTE — Addendum Note (Signed)
 Addended by: NIEVES COUGH R on: 12/18/2024 12:21 PM   Modules accepted: Orders

## 2024-12-18 NOTE — Telephone Encounter (Signed)
 Called patient to scheduled UA drop off patient stated he will be going to see his PCP today patient was advised to call us  after he completed UA to have the UA forwarded to MD Eskridge patient voiced his understanding

## 2024-12-18 NOTE — Progress Notes (Signed)
" ° °  Subjective:    Patient ID: David Dougherty, male    DOB: 10/28/1951, 74 y.o.   MRN: [id number]  HPI Patient is in room 9:  Patient is here for having blood in urine. Patient has photo of it and its very bright red.  Started 2 days ago Patient relates 2 days ago started with hematuria Relates when he pees sometimes there is blood clots and then the plain urine other times is just regular urine another time hematuria He is on Coumadin  Couple days ago his INR was slightly elevated We reduced his Coumadin  Today his INR is 3.6 He denies any changes to his eating habits   Review of Systems     Objective:   Physical Exam  Lungs are clear hearts regular abdomen soft  UA with RBCs with gross hematuria    Assessment & Plan:   Gross hematuria It is important to go ahead with stopping Coumadin  at this point I did discuss this with the patient Ideally we would like to restart the Coumadin  down the road but if possible to do a recheck on Monday of the INR and evaluate whether or not the bleeding is stopped at that point We are ordering a CT scan hopefully this can be done in the near future His urologist will do further evaluation including cystoscope He will share the results of CT with patient as well as urology "

## 2024-12-18 NOTE — Patient Instructions (Addendum)
 Hi Bunny  For now it would be wise to stop the Coumadin . Please follow-up this coming Monday morning for a INR check We will send a copy of today's visit and urinalysis to Dr. Nieves urology We will be setting you up for a specialized scan of your kidneys to make sure we do not see any signs of any nodules or growths Dr. Nieves more than likely will talk about doing a cystoscope to look inside your bladder When you come back next week we will reestablish the Coumadin  at a lower dose Please do your lab work as well The bleeding should check up over the next 2 to 3 days It would be very unlikely that you would lose any serious amount of blood Certainly if for some reason you are having a alarming amount of bleeding then to go to the ER Thanks-Dr. Glendia

## 2024-12-18 NOTE — Telephone Encounter (Signed)
 MD Luking ordered a CT hematuria today at patient appointment can the patient use that

## 2024-12-20 LAB — CBC WITH DIFFERENTIAL/PLATELET
Basophils Absolute: 0 x10E3/uL (ref 0.0–0.2)
Basos: 0 %
EOS (ABSOLUTE): 0.2 x10E3/uL (ref 0.0–0.4)
Eos: 5 %
Hematocrit: 46.8 % (ref 37.5–51.0)
Hemoglobin: 15.4 g/dL (ref 13.0–17.7)
Immature Grans (Abs): 0 x10E3/uL (ref 0.0–0.1)
Immature Granulocytes: 0 %
Lymphocytes Absolute: 1.4 x10E3/uL (ref 0.7–3.1)
Lymphs: 28 %
MCH: 29.5 pg (ref 26.6–33.0)
MCHC: 32.9 g/dL (ref 31.5–35.7)
MCV: 90 fL (ref 79–97)
Monocytes Absolute: 0.3 x10E3/uL (ref 0.1–0.9)
Monocytes: 7 %
Neutrophils Absolute: 3.1 x10E3/uL (ref 1.4–7.0)
Neutrophils: 60 %
Platelets: 183 x10E3/uL (ref 150–450)
RBC: 5.22 x10E6/uL (ref 4.14–5.80)
RDW: 12.9 % (ref 11.6–15.4)
WBC: 5.2 x10E3/uL (ref 3.4–10.8)

## 2024-12-20 LAB — BASIC METABOLIC PANEL WITH GFR
BUN/Creatinine Ratio: 13 (ref 10–24)
BUN: 16 mg/dL (ref 8–27)
CO2: 19 mmol/L — ABNORMAL LOW (ref 20–29)
Calcium: 9.4 mg/dL (ref 8.6–10.2)
Chloride: 107 mmol/L — ABNORMAL HIGH (ref 96–106)
Creatinine, Ser: 1.2 mg/dL (ref 0.76–1.27)
Glucose: 118 mg/dL — ABNORMAL HIGH (ref 70–99)
Potassium: 4.1 mmol/L (ref 3.5–5.2)
Sodium: 142 mmol/L (ref 134–144)
eGFR: 63 mL/min/1.73

## 2024-12-20 NOTE — Telephone Encounter (Signed)
 Please schedule patient as listed below

## 2024-12-20 NOTE — Telephone Encounter (Signed)
 Called patient to give him MD Eskridge recommendations patient voiced his understanding and was scheduled for Cysto 01/20/2025 @ 4 PM

## 2024-12-23 ENCOUNTER — Ambulatory Visit (HOSPITAL_COMMUNITY)
Admission: RE | Admit: 2024-12-23 | Discharge: 2024-12-23 | Disposition: A | Discharge status: Home / Self Care | Source: Ambulatory Visit | Attending: Family Medicine | Admitting: Family Medicine

## 2024-12-23 ENCOUNTER — Ambulatory Visit: Payer: Self-pay

## 2024-12-23 ENCOUNTER — Ambulatory Visit

## 2024-12-23 VITALS — BP 128/82 | HR 84 | Temp 98.2°F | Ht 73.0 in | Wt 216.4 lb

## 2024-12-23 DIAGNOSIS — I10 Essential (primary) hypertension: Secondary | ICD-10-CM | POA: Diagnosis not present

## 2024-12-23 DIAGNOSIS — R319 Hematuria, unspecified: Secondary | ICD-10-CM | POA: Insufficient documentation

## 2024-12-23 DIAGNOSIS — Z7901 Long term (current) use of anticoagulants: Secondary | ICD-10-CM | POA: Diagnosis not present

## 2024-12-23 LAB — POCT INR: POC INR: 1.1

## 2024-12-23 MED ORDER — IOHEXOL 300 MG/ML  SOLN
100.0000 mL | Freq: Once | INTRAMUSCULAR | Status: AC | PRN
  Administered 2024-12-23: 100 mL via INTRAVENOUS

## 2024-12-23 NOTE — Progress Notes (Signed)
 See previous note from 12/23/2024. Patient was on Eliquis, but had cost restraints with this. Was started on coumadin , but had gross hematuria and has discontinued this at this time. Awaiting CT results before reinitiating anticoagulation therapy. Placed order for pharmacy care management due to medication assistance for Eliquis.   Orders Placed This Encounter  Procedures   AMB Referral VBCI Care Management    Referral Priority:   Routine    Referral Type:   Consultation    Referral Reason:   Care Coordination    Number of Visits Requested:   1   POCT INR     Damien Pringle, FNP

## 2024-12-23 NOTE — Progress Notes (Signed)
 "  Acute Office Visit  Subjective:     Patient ID: David Dougherty, male    DOB: Dec 05, 1951, 74 y.o.   MRN: [id number]  Chief Complaint  Patient presents with   Coagulation Disorder    1.1    HPI Discussed the use of AI scribe software for clinical note transcription with the patient, who gave verbal consent to proceed.  History of Present Illness David Dougherty is a 74 year old male with a history of stroke who presents with hematuria while on warfarin therapy.  He has experienced gross hematuria, which led to the discontinuation of his warfarin therapy.  Was seen in the office on 12/18/2024 for this.  A CT scan of the pelvis is scheduled to investigate the cause of the bleeding.  Plans to get this after his appointment today. He has not yet consulted with urology but expects an appointment will be arranged after the scan results are available.  He has a history of stroke, which necessitated anticoagulation therapy with warfarin. He is uncertain about the initial reason for starting warfarin but recalls having a stroke, possibly related to a previous shot. He wishes to resume anticoagulation therapy, but is concerned about the bleeding issues.  His INR is currently 1.1, below the therapeutic range of 2-3 for warfarin.  He has not used Eliquis due to cost concerns and personal preference. His insurance does not cover Eliquis, which is more expensive than warfarin.  No current chest pain or shortness of breath.     Review of Systems  Respiratory:  Negative for cough, chest tightness, shortness of breath and wheezing.   Cardiovascular:  Negative for chest pain, palpitations and leg swelling.  Genitourinary:  Negative for difficulty urinating and hematuria.  Hematological:  Does not bruise/bleed easily.       Objective:    Today's Vitals   12/23/24 1008 12/23/24 1015  BP: (!) 147/90 128/82  Pulse: 84   Temp: 98.2 F (36.8 C)   SpO2: 97%   Weight: 216 lb 6.4 oz  (98.2 kg)   Height: 6' 1 (1.854 m)    Body mass index is 28.55 kg/m.   Physical Exam Vitals and nursing note reviewed.  Constitutional:      General: He is not in acute distress.    Appearance: Normal appearance. He is not ill-appearing.  Cardiovascular:     Rate and Rhythm: Normal rate and regular rhythm.     Heart sounds: Normal heart sounds, S1 normal and S2 normal. No murmur heard. Pulmonary:     Effort: Pulmonary effort is normal. No respiratory distress.     Breath sounds: Normal breath sounds. No wheezing.  Neurological:     Mental Status: He is alert.  Psychiatric:        Mood and Affect: Mood normal.        Behavior: Behavior normal.        Thought Content: Thought content normal.        Judgment: Judgment normal.     Results for orders placed or performed in visit on 12/23/24  POCT INR  Result Value Ref Range   INR     POC INR 1.1       Assessment & Plan:  1. Encounter for current long-term use of anticoagulants (Primary) - Off warfarin due to hematuria. INR 1.1. Discussed stroke risks without anticoagulation and benefits of Eliquis over warfarin. Aspirin  inadequate for stroke prevention. Emphasized anticoagulation importance to prevent stroke and disability. -  Consider Eliquis post-urology evaluation and CT results.  Patient will let us  know what he decides regarding anticoagulation choice after CT results.  - POCT INR  2. Essential hypertension - Blood pressure improved to 128/82 mmHg.  3. Hematuria, unspecified type - Hematuria resolved after stopping warfarin. CT pelvis ordered to evaluate cause. Awaiting results.  - Complete CT of the pelvis. - Follow up with urology for further evaluation and management.  Return if symptoms worsen or fail to improve.    David KATHEE Pringle, FNP  Note:  This document was prepared using Dragon voice recognition software and may include unintentional dictation errors.   "

## 2024-12-23 NOTE — Addendum Note (Signed)
 Addended by: WHEELER, Chevelle Durr B on: 12/23/2024 12:20 PM   Modules accepted: Orders

## 2024-12-23 NOTE — Assessment & Plan Note (Signed)
-   Hematuria resolved after stopping warfarin. CT pelvis ordered to evaluate cause. Awaiting results. - Complete CT of the pelvis. - Follow up with urology for further evaluation and management.

## 2024-12-24 ENCOUNTER — Telehealth: Payer: Self-pay

## 2024-12-24 NOTE — Progress Notes (Signed)
 Care Guide Pharmacy Note  12/24/2024 Name: David Dougherty MRN: [id number] DOB: June 17, 1951  Referred By: Alphonsa Glendia LABOR, MD Reason for referral: Complex Care Management (Outreach to schedule with Pharm d )   David Dougherty is a 74 y.o. year old male who is a primary care patient of Luking, Glendia LABOR, MD.  David Dougherty was referred to the pharmacist for assistance related to: Atrial Fibrillation  Successful contact was made with the patient to discuss pharmacy services including being ready for the pharmacist to call at least 5 minutes before the scheduled appointment time and to have medication bottles and any blood pressure readings ready for review. The patient agreed to meet with the pharmacist via telephone visit on (date/time).12/30/2024  Jeoffrey Buffalo , RMA     [place]  [facility], [facility] Guide  Direct Dial: [phone]  Website: [email or web address]

## 2024-12-27 ENCOUNTER — Other Ambulatory Visit: Payer: Self-pay

## 2024-12-27 DIAGNOSIS — R319 Hematuria, unspecified: Secondary | ICD-10-CM

## 2024-12-27 NOTE — Progress Notes (Signed)
 Referral to urology placed as indicated

## 2024-12-30 ENCOUNTER — Other Ambulatory Visit

## 2024-12-30 NOTE — Progress Notes (Unsigned)
 [facility] cardiomyopathy Z86.79 h/o cardiomyopathy Afib ELIQUIS  Warfarin Off of warfarin now Messaged MD to call in eliquis INR 1.1

## 2025-01-02 ENCOUNTER — Telehealth: Payer: Self-pay | Admitting: Family Medicine

## 2025-01-02 NOTE — Telephone Encounter (Signed)
 Nurses Recently patient had hematuria Had a CAT scan which did not show any acute findings as a cause We recommended for him to see Dr. Nieves with urology Please find out from patient Does he have an appointment with Dr. Nieves?  If so when?  Also let him know that the clinical pharmacist says that she was able to get him qualified for a patient assistance program with Eliquis which would cover the cost of the blood thinner.  This is a safer blood thinner than Coumadin .  There is still some risk he could have hematuria with Eliquis but less likely.  If he has additional questions let me know Eliquis would reduce the risk of him having a stroke.  There is still some bleeding risk with Eliquis but not as much as there is with Coumadin .  There are some options Option #1 we could restart the Eliquis as soon as the patient assistant program approves it 2.  Or patient could see urology get properly evaluated and then if everything comes back looking fine then start Eliquis  Please let me know what the patient would like to do

## 2025-01-02 NOTE — Telephone Encounter (Signed)
See other tele message. 

## 2025-01-02 NOTE — Telephone Encounter (Signed)
 Patient is aware per message below-- Recently patient had hematuria Had a CAT scan which did not show any acute findings as a cause We recommended for him to see Dr. Nieves with urology Please find out from patient Does he have an appointment with Dr. Nieves?  If so when?   Also let him know that the clinical pharmacist says that she was able to get him qualified for a patient assistance program with Eliquis which would cover the cost of the blood thinner.  This is a safer blood thinner than Coumadin .  There is still some risk he could have hematuria with Eliquis but less likely.   If he has additional questions let me know Eliquis would reduce the risk of him having a stroke.  There is still some bleeding risk with Eliquis but not as much as there is with Coumadin .   There are some options Option #1 we could restart the Eliquis as soon as the patient assistant program approves it 2.  Or patient could see urology get properly evaluated and then if everything comes back looking fine then start Eliquis   Please let me know what the patient would like to do-- Patient states he had been contacted by pharmacist and waiting on final response with decision, he also had sent a mychart message to urologist office regarding setting up an evaluation, he is waiting to hear back from that office, he would like to be evaluated by the urologist prior to starting on eliquis.        Electronically signed by Alphonsa Glendia LABOR, MD at 01/02/2025 10:19 AM

## 2025-01-02 NOTE — Telephone Encounter (Signed)
 Message also left for pt to return call

## 2025-01-07 NOTE — Telephone Encounter (Signed)
 Pt trying to get earlier urology appt  Feb 16th 2026

## 2025-01-08 ENCOUNTER — Ambulatory Visit: Admitting: Orthopedic Surgery

## 2025-01-10 ENCOUNTER — Encounter: Payer: Self-pay | Admitting: Orthopedic Surgery

## 2025-01-10 ENCOUNTER — Ambulatory Visit

## 2025-01-10 ENCOUNTER — Ambulatory Visit: Admitting: Orthopedic Surgery

## 2025-01-10 DIAGNOSIS — G8929 Other chronic pain: Secondary | ICD-10-CM

## 2025-01-10 NOTE — Progress Notes (Signed)
 New Patient Visit  Assessment: Diquan L Ke is a 74 y.o. male with the following: 1. Chronic pain of left knee; moderate left knee arthritis   Plan: [place] L Brede has arthritis in his left knee.  Previous injection was helpful.  He is interested in a repeat injection.  He will consider HA injections.  He may return for another steroid injection as he is planning to go to the beach in [place].    Procedure note injection Left knee joint   Verbal consent was obtained to inject the left knee joint  Timeout was completed to confirm the site of injection.  The skin was prepped with alcohol and ethyl chloride was sprayed at the injection site.  A 21-gauge needle was used to inject 40 mg of Depo-Medrol  and 1% lidocaine  (4 cc) into the left knee using an anterolateral approach.  There were no complications. A sterile bandage was applied.    Follow-up: Return if symptoms worsen or fail to improve.  Subjective:  Chief Complaint  Patient presents with   Knee Pain    Left- bad for last month Dr. Alphonsa stopped warfarin going to start eliquis after I see Dr. Nieves    History of Present Illness: Apostolos L Lo is a 74 y.o. male who returns for repeat evaluation of left knee pain.  He continues to have pain in the medial knee.  No recent injury.  Previous steroid injection was helpful.     Review of Systems: No fevers or chills No numbness or tingling No chest pain No shortness of breath No bowel or bladder dysfunction No GI distress No headaches   Physical Exam:  General: Alert and oriented. and No acute distress. Gait: Left sided antalgic gait.  Left knee with mild varus alignment.  Mild deformity.  Tenderness to palpation along the medial joint line.  He has good range of motion.  Mild tenderness to palpation of the posterior knee.  Negative Lachman.  No increased laxity varus or valgus stress.  IMAGING: I personally ordered and reviewed the following  images  No new XR obtained today    New Medications:  No orders of the defined types were placed in this encounter.     Oneil DELENA Horde, MD  01/10/2025 11:11 AM

## 2025-01-10 NOTE — Patient Instructions (Signed)

## 2025-01-20 ENCOUNTER — Other Ambulatory Visit: Admitting: Urology

## 2025-04-21 ENCOUNTER — Ambulatory Visit: Admitting: Urology
